# Patient Record
Sex: Male | Born: 2000 | Race: Black or African American | Hispanic: No | Marital: Single | Smoking: Never smoker
Health system: Southern US, Community
[De-identification: ages and names within clinical notes are randomized; demographics above are authoritative.]

## PROBLEM LIST (undated history)

## (undated) DIAGNOSIS — J45909 Unspecified asthma, uncomplicated: Secondary | ICD-10-CM

---
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
## 2018-05-08 ENCOUNTER — Emergency Department (HOSPITAL_COMMUNITY)
Admission: EM | Admit: 2018-05-08 | Discharge: 2018-05-08 | Disposition: A | Discharge status: Home / Self Care | Payer: Medicaid Other | Attending: Emergency Medicine | Admitting: Emergency Medicine

## 2018-05-08 ENCOUNTER — Encounter (HOSPITAL_COMMUNITY): Payer: Self-pay | Admitting: *Deleted

## 2018-05-08 DIAGNOSIS — J4521 Mild intermittent asthma with (acute) exacerbation: Secondary | ICD-10-CM | POA: Diagnosis not present

## 2018-05-08 DIAGNOSIS — R0989 Other specified symptoms and signs involving the circulatory and respiratory systems: Secondary | ICD-10-CM | POA: Diagnosis present

## 2018-05-08 MED ORDER — PREDNISONE 20 MG PO TABS: 40.0000 mg | ORAL_TABLET | Freq: Every day | ORAL | 0 refills | Status: AC

## 2018-05-08 MED ORDER — IPRATROPIUM-ALBUTEROL 0.5-2.5 (3) MG/3ML IN SOLN
3.0000 mL | Freq: Once | RESPIRATORY_TRACT | Status: AC
  Administered 2018-05-08: 3 mL via RESPIRATORY_TRACT
  Filled 2018-05-08: qty 3

## 2018-05-08 MED ORDER — ALBUTEROL SULFATE HFA 108 (90 BASE) MCG/ACT IN AERS: 1.0000 | INHALATION_SPRAY | Freq: Four times a day (QID) | RESPIRATORY_TRACT | 0 refills | Status: DC | PRN

## 2018-05-08 MED ORDER — PREDNISONE 20 MG PO TABS
60.0000 mg | ORAL_TABLET | Freq: Once | ORAL | Status: AC
  Administered 2018-05-08: 60 mg via ORAL
  Filled 2018-05-08: qty 3

## 2018-05-08 NOTE — ED Provider Notes (Signed)
[facility] EMERGENCY DEPARTMENT Provider Note   CSN: [id number] Arrival date & time: 05/08/18  0840     History   Chief Complaint Chief Complaint  Patient presents with  . Asthma    HPI Hector Gross is a 17 y.o. male with past medical history of asthma is here for evaluation of chest tightness.  Onset last night.  Chest tightness is constant, moderate but worse when he exerts himself.  Associated with dry cough, wheezing.  Symptoms are similar to previous asthma flares.  Ran out of his inhaler 1 week ago.  Typically uses it as needed.  He has no fevers, sputum, nausea, vomiting, sore throat, nasal congestion or rhinorrhea.  No cigarette use.  HPI  Past Medical History:  Diagnosis Date  . Asthma     There are no active problems to display for this patient.   History reviewed. No pertinent surgical history.      Home Medications    Prior to Admission medications   Medication Sig Start Date End Date Taking? Authorizing Provider  albuterol (PROVENTIL HFA;VENTOLIN HFA) 108 (90 Base) MCG/ACT inhaler Inhale 1-2 puffs into the lungs every 6 (six) hours as needed for wheezing or shortness of breath. 05/08/18   Liberty Handy, PA-C  predniSONE (DELTASONE) 20 MG tablet Take 2 tablets (40 mg total) by mouth daily for 5 days. 05/08/18 05/13/18  Liberty Handy, PA-C    Family History History reviewed. No pertinent family history.  Social History Social History   Tobacco Use  . Smoking status: Never Smoker  . Smokeless tobacco: Never Used  Substance Use Topics  . Alcohol use: No  . Drug use: No     Allergies   Patient has no known allergies.   Review of Systems Review of Systems  Respiratory: Positive for cough, chest tightness and wheezing.   All other systems reviewed and are negative.    Physical Exam Updated Vital Signs BP (!) 121/97 (BP Location: Right Arm)   Pulse 78   Temp 97.7 F (36.5 C) (Oral)   Resp 16   Ht 5\' 11"   (1.803 m)   SpO2 93%   BMI 25.10 kg/m   Physical Exam  Constitutional: He is oriented to person, place, and time. He appears well-developed and well-nourished. No distress.  NAD.  HENT:  Head: Normocephalic and atraumatic.  Right Ear: External ear normal.  Left Ear: External ear normal.  Nose: Nose normal.  Sounds congested. Mild mucosal edema bilaterally. No rhinorrhea.   Eyes: Conjunctivae and EOM are normal. No scleral icterus.  Neck: Normal range of motion. Neck supple.  Cardiovascular: Normal rate, regular rhythm, normal heart sounds and intact distal pulses.  No murmur heard. Pulmonary/Chest: Effort normal. He has wheezes.  Inspiratory and expiratory wheezing in upper and middle lobes anteriorly/posteriorly.  Clear lung sounds in lower lobes.  Normal work of breathing.  Musculoskeletal: Normal range of motion. He exhibits no deformity.  Neurological: He is alert and oriented to person, place, and time.  Skin: Skin is warm and dry. Capillary refill takes less than 2 seconds.  Psychiatric: He has a normal mood and affect. His behavior is normal. Judgment and thought content normal.  Nursing note and vitals reviewed.    ED Treatments / Results  Labs (all labs ordered are listed, but only abnormal results are displayed) Labs Reviewed - No data to display  EKG None  Radiology No results found.  Procedures Procedures (including critical care time)  Medications  Ordered in ED Medications  ipratropium-albuterol (DUONEB) 0.5-2.5 (3) MG/3ML nebulizer solution 3 mL (3 mLs Nebulization Given 05/08/18 0901)  predniSONE (DELTASONE) tablet 60 mg (60 mg Oral Given 05/08/18 0901)     Initial Impression / Assessment and Plan / ED Course  I have reviewed the triage vital signs and the nursing notes.  Pertinent labs & imaging results that were available during my care of the patient were reviewed by me and considered in my medical decision making (see chart for details).     Pt  presents with chest tightness, wheezing and cough onset last night. H/o asthma. On exam, pt is non toxic appearing with normal breathing effort. No fever, no tachypnea, no tachycardia, normal oxygen saturations. Wheezing improved after duoneb x 1 and prednisone in ER. Pt feels better.  Given reassuring exam w/o fever, tachypnea, hypoxia CXR not indicated.  Likely viral URI in setting of asthma exacerbation vs med non compliance. Will tx symptoms conservatively plus albuterol and prednisone. ED return precautions given. Patient is aware that a viral URI infection may precede the onset of bacterial bronchitis or pneumonia. Patient is aware of s/s that would warrant return to ED for further reevaluation. Pt ambulated with pulse ox within normal limits prior to discharge.    Final Clinical Impressions(s) / ED Diagnoses   Final diagnoses:  Mild intermittent asthma with acute exacerbation    ED Discharge Orders        Ordered    albuterol (PROVENTIL HFA;VENTOLIN HFA) 108 (90 Base) MCG/ACT inhaler  Every 6 hours PRN     05/08/18 0929    predniSONE (DELTASONE) 20 MG tablet  Daily     05/08/18 0929       Liberty Handy, PA-C 05/08/18 0931    Little, Ambrose Finland, MD 05/08/18 1152

## 2018-05-08 NOTE — ED Triage Notes (Signed)
Pt in stating his asthma has been flaring since last night, states he is out of his inhaler, no distress noted, reports cough the last few days

## 2018-05-08 NOTE — Discharge Instructions (Signed)
You were seen in the ER for chest tightness, wheezing, cough.   Your wheezing improved after breathing treatment.   Symptoms are likely from a flare of asthma or non compliance with inhaler.   We will treat your symptoms with a short burst of prednisone and inhaler use every 6 hours. Taking a daily allergy medication can also help prevent and reduce asthma flares.   Return to the ER for fevers, worsening cough with phlegm, chest pain or shortness of breath with exertion.

## 2018-07-04 ENCOUNTER — Emergency Department (HOSPITAL_COMMUNITY): Payer: Medicaid Other

## 2018-07-04 ENCOUNTER — Encounter (HOSPITAL_COMMUNITY): Payer: Self-pay | Admitting: *Deleted

## 2018-07-04 ENCOUNTER — Emergency Department (HOSPITAL_COMMUNITY)
Admission: EM | Admit: 2018-07-04 | Discharge: 2018-07-04 | Disposition: A | Discharge status: Home / Self Care | Payer: Medicaid Other | Attending: Emergency Medicine | Admitting: Emergency Medicine

## 2018-07-04 DIAGNOSIS — J4521 Mild intermittent asthma with (acute) exacerbation: Secondary | ICD-10-CM | POA: Insufficient documentation

## 2018-07-04 DIAGNOSIS — R05 Cough: Secondary | ICD-10-CM | POA: Diagnosis not present

## 2018-07-04 DIAGNOSIS — J45901 Unspecified asthma with (acute) exacerbation: Secondary | ICD-10-CM

## 2018-07-04 MED ORDER — PREDNISONE 10 MG PO TABS: 40.0000 mg | ORAL_TABLET | Freq: Every day | ORAL | 0 refills | Status: AC

## 2018-07-04 MED ORDER — PREDNISONE 20 MG PO TABS
60.0000 mg | ORAL_TABLET | Freq: Once | ORAL | Status: AC
  Administered 2018-07-04: 60 mg via ORAL
  Filled 2018-07-04: qty 3

## 2018-07-04 MED ORDER — ALBUTEROL SULFATE HFA 108 (90 BASE) MCG/ACT IN AERS
1.0000 | INHALATION_SPRAY | Freq: Once | RESPIRATORY_TRACT | Status: AC
  Administered 2018-07-04: 1 via RESPIRATORY_TRACT
  Filled 2018-07-04: qty 6.7

## 2018-07-04 MED ORDER — IPRATROPIUM-ALBUTEROL 0.5-2.5 (3) MG/3ML IN SOLN
3.0000 mL | Freq: Once | RESPIRATORY_TRACT | Status: AC
Start: 2018-07-04 — End: 2018-07-04
  Administered 2018-07-04: 3 mL via RESPIRATORY_TRACT
  Filled 2018-07-04: qty 3

## 2018-07-04 NOTE — ED Provider Notes (Signed)
[facility] EMERGENCY DEPARTMENT Provider Note   CSN: [id number] Arrival date & time: 07/04/18  2130     History   Chief Complaint Chief Complaint  Patient presents with  . Cough    HPI Hector Gross is a 17 y.o. male presenting for evaluation of cough, nasal congestion.  Pt states he has been having cough and shortness of breath since last night.  Patient reports a history of asthma, states this feels similar to previous asthma exacerbations.  He does not have a rescue inhaler at home.  He has used prednisone before, but has not used any recently.  He denies fevers, chills, nasal congestion, ear pain, chest pain, nausea, vomiting, abdominal pain.  He denies tobacco, alcohol, or drug use.  He does not have a PCP.  He has no other medical problems, takes medications daily.    Plan:   HPI  Past Medical History:  Diagnosis Date  . Asthma     There are no active problems to display for this patient.   History reviewed. No pertinent surgical history.      Home Medications    Prior to Admission medications   Medication Sig Start Date End Date Taking? Authorizing Provider  albuterol (PROVENTIL HFA;VENTOLIN HFA) 108 (90 Base) MCG/ACT inhaler Inhale 1-2 puffs into the lungs every 6 (six) hours as needed for wheezing or shortness of breath. 05/08/18   Liberty Handy, PA-C  predniSONE (DELTASONE) 10 MG tablet Take 4 tablets (40 mg total) by mouth daily for 4 days. 07/04/18 07/08/18  Layanna Charo, PA-C    Family History History reviewed. No pertinent family history.  Social History Social History   Tobacco Use  . Smoking status: Never Smoker  . Smokeless tobacco: Never Used  Substance Use Topics  . Alcohol use: No  . Drug use: No     Allergies   Patient has no known allergies.   Review of Systems Review of Systems  Constitutional: Negative for fever.  Respiratory: Positive for cough and shortness of breath.      Physical  Exam Updated Vital Signs BP 138/75 (BP Location: Right Arm)   Pulse 71   Temp 98.4 F (36.9 C) (Oral)   Resp 18   SpO2 96%   Physical Exam  Constitutional: He is oriented to person, place, and time. He appears well-developed and well-nourished. No distress.  Appears in NAD  HENT:  Head: Normocephalic and atraumatic.  Right Ear: Tympanic membrane, external ear and ear canal normal.  Left Ear: Tympanic membrane, external ear and ear canal normal.  Nose: Mucosal edema present. Right sinus exhibits no maxillary sinus tenderness and no frontal sinus tenderness. Left sinus exhibits no maxillary sinus tenderness and no frontal sinus tenderness.  Mouth/Throat: Uvula is midline, oropharynx is clear and moist and mucous membranes are normal. No tonsillar exudate.  OP clear without tonsillar swelling or exudate.  Uvula midline vertical palate rise.  Eyes: Pupils are equal, round, and reactive to light. Conjunctivae and EOM are normal.  Neck: Normal range of motion.  Cardiovascular: Normal rate, regular rhythm and intact distal pulses.  Pulmonary/Chest: Effort normal. He has no decreased breath sounds. He has wheezes. He has no rhonchi. He has no rales.  Pt speaking in full sentences without difficulty.  inspiratory and expiratory wheezes in all fields  Abdominal: Soft. He exhibits no distension. There is no tenderness.  Musculoskeletal: Normal range of motion.  Lymphadenopathy:    He has no cervical adenopathy.  Neurological:  He is alert and oriented to person, place, and time.  Skin: Skin is warm.  Psychiatric: He has a normal mood and affect.  Nursing note and vitals reviewed.    ED Treatments / Results  Labs (all labs ordered are listed, but only abnormal results are displayed) Labs Reviewed - No data to display  EKG None  Radiology Dg Chest 2 View  Result Date: 07/04/2018 CLINICAL DATA:  Cough, nasal congestion EXAM: CHEST - 2 VIEW COMPARISON:  12/30/2017 FINDINGS: Heart and  mediastinal contours are within normal limits. No focal opacities or effusions. No acute bony abnormality. IMPRESSION: No active cardiopulmonary disease. Electronically Signed   By: Charlett Nose M.D.   On: 07/04/2018 09:33    Procedures Procedures (including critical care time)  Medications Ordered in ED Medications  predniSONE (DELTASONE) tablet 60 mg (60 mg Oral Given 07/04/18 0949)  albuterol (PROVENTIL HFA;VENTOLIN HFA) 108 (90 Base) MCG/ACT inhaler 1 puff (1 puff Inhalation Given 07/04/18 0951)  ipratropium-albuterol (DUONEB) 0.5-2.5 (3) MG/3ML nebulizer solution 3 mL (3 mLs Nebulization Given 07/04/18 0951)     Initial Impression / Assessment and Plan / ED Course  I have reviewed the triage vital signs and the nursing notes.  Pertinent labs & imaging results that were available during my care of the patient were reviewed by me and considered in my medical decision making (see chart for details).     Pt presenting for evaluation of cough and shortness of breath.  Physical exam shows young healthy patient in no acute distress.  Pulmonary exam shows inspiratory respiratory wheezing.  Will give prednisone, DuoNeb, inhaler, and reassess.  On reassessment, lungs are improved, scattered intermittent wheezing.  Patient states he no longer feels short of breath or like he is wheezing.  Chest x-ray viewed interpreted by me, no pneumonia, pneumothorax, effusions.  Discussed importance of follow-up with PCP to establish primary care for preventative treatment.  At this time, patient appears safe for discharge.  Return precautions given.  Patient states he understands and agrees plan.   Final Clinical Impressions(s) / ED Diagnoses   Final diagnoses:  Mild asthma with exacerbation, unspecified whether persistent    ED Discharge Orders         Ordered    predniSONE (DELTASONE) 10 MG tablet  Daily     07/04/18 1007           Alveria Apley, PA-C 07/04/18 1009    Azalia Bilis,  MD 07/05/18 2044

## 2018-07-04 NOTE — ED Notes (Signed)
Pt verbalized understanding of discharge instructions and denies any further questions at this time.   

## 2018-07-04 NOTE — ED Triage Notes (Signed)
Pt in c/o asthma exaserbation, is out of his inhaler, took some leftover prednisone but that did not improve things, reports cough and nasal congestion, no distress noted

## 2018-07-04 NOTE — ED Notes (Signed)
ED Provider at bedside. 

## 2018-07-04 NOTE — Discharge Instructions (Signed)
Take prednisone as prescribed. Use the albuterol inhaler as needed for wheezing, shortness of breath, or chest tightness. Return to the emergency room if you develop increased difficult to breathing, persistent chest pain, or any new or concerning symptoms.

## 2018-09-06 ENCOUNTER — Emergency Department (HOSPITAL_COMMUNITY)
Admission: EM | Admit: 2018-09-06 | Discharge: 2018-09-06 | Disposition: A | Discharge status: Home / Self Care | Payer: Medicaid Other | Attending: Emergency Medicine | Admitting: Emergency Medicine

## 2018-09-06 ENCOUNTER — Other Ambulatory Visit: Payer: Self-pay

## 2018-09-06 ENCOUNTER — Encounter (HOSPITAL_COMMUNITY): Payer: Self-pay | Admitting: *Deleted

## 2018-09-06 DIAGNOSIS — R0602 Shortness of breath: Secondary | ICD-10-CM | POA: Diagnosis present

## 2018-09-06 DIAGNOSIS — J45901 Unspecified asthma with (acute) exacerbation: Secondary | ICD-10-CM | POA: Insufficient documentation

## 2018-09-06 MED ORDER — ALBUTEROL SULFATE (2.5 MG/3ML) 0.083% IN NEBU
2.5000 mg | INHALATION_SOLUTION | Freq: Once | RESPIRATORY_TRACT | Status: AC
Start: 2018-09-06 — End: 2018-09-06
  Administered 2018-09-06: 2.5 mg via RESPIRATORY_TRACT
  Filled 2018-09-06: qty 3

## 2018-09-06 MED ORDER — ALBUTEROL SULFATE HFA 108 (90 BASE) MCG/ACT IN AERS: 1.0000 | INHALATION_SPRAY | Freq: Four times a day (QID) | RESPIRATORY_TRACT | 1 refills | Status: DC | PRN

## 2018-09-06 MED ORDER — PREDNISONE 20 MG PO TABS
60.0000 mg | ORAL_TABLET | Freq: Once | ORAL | Status: AC
  Administered 2018-09-06: 60 mg via ORAL
  Filled 2018-09-06: qty 3

## 2018-09-06 MED ORDER — IPRATROPIUM-ALBUTEROL 0.5-2.5 (3) MG/3ML IN SOLN
3.0000 mL | Freq: Once | RESPIRATORY_TRACT | Status: AC
  Administered 2018-09-06: 3 mL via RESPIRATORY_TRACT
  Filled 2018-09-06: qty 3

## 2018-09-06 MED ORDER — ALBUTEROL SULFATE HFA 108 (90 BASE) MCG/ACT IN AERS
2.0000 | INHALATION_SPRAY | Freq: Once | RESPIRATORY_TRACT | Status: AC
  Administered 2018-09-06: 2 via RESPIRATORY_TRACT
  Filled 2018-09-06: qty 6.7

## 2018-09-06 MED ORDER — AEROCHAMBER PLUS FLO-VU MISC
1.0000 | Freq: Once | Status: AC
  Administered 2018-09-06: 1
  Filled 2018-09-06: qty 1

## 2018-09-06 MED ORDER — PREDNISONE 20 MG PO TABS: 60.0000 mg | ORAL_TABLET | Freq: Every day | ORAL | 0 refills | Status: AC

## 2018-09-06 NOTE — Discharge Instructions (Addendum)
Take prednisone as directed for the next 5 days to help prevent worsening of your asthma exacerbation, use albuterol inhaler every 4-6 hours for the next day or so and then as needed for shortness of breath and wheezing.  Follow-up with your primary care doctor for continued evaluation and treatment of your asthma.  Return for fevers, worsening shortness of breath or increased work of breathing, cough, chest pain or any other new or concerning symptoms.

## 2018-09-06 NOTE — ED Triage Notes (Signed)
Pt reports due to weather changes he has been having SHOB due to asthma

## 2018-09-06 NOTE — ED Provider Notes (Signed)
[facility] EMERGENCY DEPARTMENT Provider Note   CSN: [id number] Arrival date & time: 09/06/18  0907     History   Chief Complaint Chief Complaint  Patient presents with  . Asthma    HPI Hector Gross is a 17 y.o. male.  Hector Gross is a 17 y.o. Male with a history of asthma, who presents to the emergency department for evaluation of shortness of breath and wheezing which started last night.  He reports this feels like his typical asthma exacerbation.  He has had some associated dry cough but no nasal congestion or rhinorrhea, no sore throat, no ear pain.  No associated fevers or chills.  Patient denies any chest pain.  He reports this feels like his typical asthma exacerbation was also happens with changes in weather or when he gets hot or has increased physical activity.  He reports he is out of his inhaler and has not tried anything else to treat his symptoms prior to arrival, no other aggravating or relieving factors.     Past Medical History:  Diagnosis Date  . Asthma     There are no active problems to display for this patient.   No past surgical history on file.      Home Medications    Prior to Admission medications   Medication Sig Start Date End Date Taking? Authorizing Provider  albuterol (PROVENTIL HFA;VENTOLIN HFA) 108 (90 Base) MCG/ACT inhaler Inhale 1-2 puffs into the lungs every 6 (six) hours as needed for wheezing or shortness of breath. 05/08/18   Liberty Handy, PA-C    Family History No family history on file.  Social History Social History   Tobacco Use  . Smoking status: Never Smoker  . Smokeless tobacco: Never Used  Substance Use Topics  . Alcohol use: No  . Drug use: No     Allergies   Patient has no known allergies.   Review of Systems Review of Systems  Constitutional: Negative for chills and fever.  HENT: Negative for congestion, ear pain, rhinorrhea and sore throat.   Respiratory: Positive for  cough, shortness of breath and wheezing. Negative for chest tightness.   Cardiovascular: Negative for chest pain and leg swelling.  Gastrointestinal: Negative for abdominal pain, nausea and vomiting.  Neurological: Negative for dizziness, syncope and light-headedness.  All other systems reviewed and are negative.    Physical Exam Updated Vital Signs BP (!) 127/113 (BP Location: Right Arm)   Pulse 83   Temp 98.1 F (36.7 C) (Oral)   Resp (!) 22   Ht 5\' 10"  (1.778 m)   Wt 86.2 kg   SpO2 96%   BMI 27.26 kg/m   Physical Exam  Constitutional: He appears well-developed and well-nourished. No distress.  HENT:  Head: Normocephalic and atraumatic.  Mouth/Throat: Oropharynx is clear and moist.  Eyes: Right eye exhibits no discharge. Left eye exhibits no discharge.  Neck: Neck supple.  Cardiovascular: Normal rate, regular rhythm, normal heart sounds and intact distal pulses. Exam reveals no gallop and no friction rub.  No murmur heard. Pulmonary/Chest: Effort normal. No respiratory distress. He has wheezes.  Respirations equal and unlabored, patient is able to speak in full sentences, lungs with faint expiratory wheezes throughout and lungs sound tight overall, no rales or rhonchi  Abdominal: Soft. Bowel sounds are normal. He exhibits no distension and no mass. There is no tenderness. There is no guarding.  Neurological: He is alert. Coordination normal.  Skin: Skin is warm and  dry. Capillary refill takes less than 2 seconds. He is not diaphoretic.  Psychiatric: He has a normal mood and affect. His behavior is normal.  Nursing note and vitals reviewed.    ED Treatments / Results  Labs (all labs ordered are listed, but only abnormal results are displayed) Labs Reviewed - No data to display  EKG None  Radiology No results found.  Procedures Procedures (including critical care time)  Medications Ordered in ED Medications  albuterol (PROVENTIL HFA;VENTOLIN HFA) 108 (90 Base)  MCG/ACT inhaler 2 puff (has no administration in time range)  aerochamber plus with mask device 1 each (has no administration in time range)  ipratropium-albuterol (DUONEB) 0.5-2.5 (3) MG/3ML nebulizer solution 3 mL (3 mLs Nebulization Given 09/06/18 0922)  albuterol (PROVENTIL) (2.5 MG/3ML) 0.083% nebulizer solution 2.5 mg (2.5 mg Nebulization Given 09/06/18 0922)  predniSONE (DELTASONE) tablet 60 mg (60 mg Oral Given 09/06/18 0922)  ipratropium-albuterol (DUONEB) 0.5-2.5 (3) MG/3ML nebulizer solution 3 mL (3 mLs Nebulization Given 09/06/18 1001)  albuterol (PROVENTIL) (2.5 MG/3ML) 0.083% nebulizer solution 2.5 mg (2.5 mg Nebulization Given 09/06/18 1001)     Initial Impression / Assessment and Plan / ED Course  I have reviewed the triage vital signs and the nursing notes.  Pertinent labs & imaging results that were available during my care of the patient were reviewed by me and considered in my medical decision making (see chart for details).  Patient presents to the emergency department with shortness of breath, wheezing and dry cough, this is consistent with his typical asthma exacerbation and he has been out of his inhaler.  He reports it feels very tight and wheezy when he breathes.  Symptoms started last night.  On arrival patient is able to speak in full sentences he is minimally tachypneic with good O2 saturations, on exam he has some scattered expiratory wheezes and sounds very tight with decreased air movement.  Will give dose of prednisone and nebulizer treatment and reevaluate.  After first nebulizer patient reports some improvement although reports that still feels tight when he breathes he has some increased air movement but now has some increased wheezing throughout we will give 1 additional nebulizer, but patient has good O2 saturations and no increased work of breathing and feel he can likely be discharged home.  After second breathing treatment air movement is much improved with  only few scattered expiratory wheezes, at this time feel patient is stable for discharge home will provide short course of prednisone, given albuterol inhaler here in the emergency department as well as prescription for replacement inhaler.  Patient encouraged to follow-up with his regular doctor.  Return precautions discussed.  Patient expresses understanding and is in agreement with plan.  Final Clinical Impressions(s) / ED Diagnoses   Final diagnoses:  Exacerbation of asthma, unspecified asthma severity, unspecified whether persistent    ED Discharge Orders         Ordered    predniSONE (DELTASONE) 20 MG tablet  Daily     09/06/18 1014    albuterol (PROVENTIL HFA;VENTOLIN HFA) 108 (90 Base) MCG/ACT inhaler  Every 6 hours PRN     09/06/18 1014           Jodi Geralds [place], [place] 09/06/18 1021    Loren Racer, MD 09/07/18 [phone]

## 2018-09-18 ENCOUNTER — Encounter (HOSPITAL_COMMUNITY): Payer: Self-pay | Admitting: Emergency Medicine

## 2018-09-18 ENCOUNTER — Other Ambulatory Visit: Payer: Self-pay

## 2018-09-18 ENCOUNTER — Emergency Department (HOSPITAL_COMMUNITY)
Admission: EM | Admit: 2018-09-18 | Discharge: 2018-09-18 | Disposition: A | Discharge status: Home / Self Care | Payer: Medicaid Other | Attending: Emergency Medicine | Admitting: Emergency Medicine

## 2018-09-18 DIAGNOSIS — J45901 Unspecified asthma with (acute) exacerbation: Secondary | ICD-10-CM

## 2018-09-18 DIAGNOSIS — R0602 Shortness of breath: Secondary | ICD-10-CM | POA: Diagnosis not present

## 2018-09-18 MED ORDER — IPRATROPIUM-ALBUTEROL 0.5-2.5 (3) MG/3ML IN SOLN
3.0000 mL | Freq: Once | RESPIRATORY_TRACT | Status: AC
  Administered 2018-09-18: 3 mL via RESPIRATORY_TRACT
  Filled 2018-09-18: qty 3

## 2018-09-18 MED ORDER — PREDNISONE 20 MG PO TABS
60.0000 mg | ORAL_TABLET | Freq: Once | ORAL | Status: AC
  Administered 2018-09-18: 60 mg via ORAL
  Filled 2018-09-18: qty 3

## 2018-09-18 MED ORDER — PREDNISONE 20 MG PO TABS: 40.0000 mg | ORAL_TABLET | Freq: Every day | ORAL | 0 refills | Status: DC

## 2018-09-18 MED ORDER — ALBUTEROL SULFATE HFA 108 (90 BASE) MCG/ACT IN AERS
1.0000 | INHALATION_SPRAY | Freq: Once | RESPIRATORY_TRACT | Status: AC
  Administered 2018-09-18: 2 via RESPIRATORY_TRACT
  Filled 2018-09-18: qty 6.7

## 2018-09-18 NOTE — Discharge Instructions (Signed)
Please take prednisone for the next 5 days Use inhaler as needed Follow up with [place] and wellness

## 2018-09-18 NOTE — ED Provider Notes (Signed)
[facility] EMERGENCY DEPARTMENT Provider Note   CSN: [id number] Arrival date & time: 09/18/18  0601     History   Chief Complaint Chief Complaint  Patient presents with  . Asthma    HPI Hector Gross is a 17 y.o. male who presents with SOB. PMH significant for asthma. He states he is here because he is having an asthma exacerbation starting tonight.  He's had coughing, wheezing, SOB. No chest pain or fevers. Nothing makes it better or worse.  He was here on 11/23 for the same and was prescribed steroids. He never took them. He's out of his inhaler.  HPI  Past Medical History:  Diagnosis Date  . Asthma     There are no active problems to display for this patient.   No past surgical history on file.      Home Medications    Prior to Admission medications   Medication Sig Start Date End Date Taking? Authorizing Provider  albuterol (PROVENTIL HFA;VENTOLIN HFA) 108 (90 Base) MCG/ACT inhaler Inhale 1-2 puffs into the lungs every 6 (six) hours as needed for wheezing or shortness of breath. 09/06/18   Dartha Lodge, PA-C    Family History No family history on file.  Social History Social History   Tobacco Use  . Smoking status: Never Smoker  . Smokeless tobacco: Never Used  Substance Use Topics  . Alcohol use: No  . Drug use: No     Allergies   Patient has no known allergies.   Review of Systems Review of Systems  Constitutional: Negative for fever.  Respiratory: Positive for cough, shortness of breath and wheezing.   Cardiovascular: Negative for chest pain.  All other systems reviewed and are negative.    Physical Exam Updated Vital Signs BP (!) 149/75 (BP Location: Right Arm)   Pulse 78   Temp 98 F (36.7 C)   Resp 19   Ht 5\' 11"  (1.803 m)   Wt 99.8 kg   SpO2 95%   BMI 30.68 kg/m   Physical Exam  Constitutional: He is oriented to person, place, and time. He appears well-developed and well-nourished. No distress.    HENT:  Head: Normocephalic and atraumatic.  Eyes: Pupils are equal, round, and reactive to light. Conjunctivae are normal. Right eye exhibits no discharge. Left eye exhibits no discharge. No scleral icterus.  Neck: Normal range of motion.  Cardiovascular: Normal rate and regular rhythm.  Pulmonary/Chest: Effort normal. No respiratory distress. He has wheezes (diffuse inspiratory and expiratory).  Abdominal: He exhibits no distension.  Neurological: He is alert and oriented to person, place, and time.  Skin: Skin is warm and dry.  Psychiatric: He has a normal mood and affect. His behavior is normal.  Nursing note and vitals reviewed.    ED Treatments / Results  Labs (all labs ordered are listed, but only abnormal results are displayed) Labs Reviewed - No data to display  EKG None  Radiology No results found.  Procedures Procedures (including critical care time)  Medications Ordered in ED Medications  albuterol (PROVENTIL HFA;VENTOLIN HFA) 108 (90 Base) MCG/ACT inhaler 1-2 puff (has no administration in time range)  ipratropium-albuterol (DUONEB) 0.5-2.5 (3) MG/3ML nebulizer solution 3 mL (3 mLs Nebulization Given 09/18/18 0628)  predniSONE (DELTASONE) tablet 60 mg (60 mg Oral Given 09/18/18 0628)  ipratropium-albuterol (DUONEB) 0.5-2.5 (3) MG/3ML nebulizer solution 3 mL (3 mLs Nebulization Given 09/18/18 0729)     Initial Impression / Assessment and Plan / ED Course  I have reviewed the triage vital signs and the nursing notes.  Pertinent labs & imaging results that were available during my care of the patient were reviewed by me and considered in my medical decision making (see chart for details).  17 year old male with asthma exacerbation. He is out of his inhaler again and didn't take steroids that were prescribed to him at last visit. Vitals are normal. Exam is remarkable for diffuse wheezing. No fever, significant cough, or chest pain. Will not obtain labs or CXR at this  time.  On repeat lung exam wheezing is improved and he feels better. He still has some wheezing. Will order 2nd tx. Pt is agreeable.  After second tx he still has mild wheezes but feels back to baseline. He doesn't have a PCP. He was encouraged to establish care. He was given another inhaler here and advised to take a prednisone burst.  Final Clinical Impressions(s) / ED Diagnoses   Final diagnoses:  Moderate asthma with exacerbation, unspecified whether persistent    ED Discharge Orders    None       Bethel Born, PA-C 09/18/18 1610    Shaune Pollack, MD 09/18/18 Ernestina Columbia

## 2018-09-18 NOTE — ED Notes (Signed)
Pt resting sitting up in bed, pt feels much better since receiving medication here. Only wheezing in left lower lobe now. VSS, NAD. Speaking in complete sentences.

## 2018-09-18 NOTE — ED Notes (Signed)
Lung sounds improved but expiratory wheezes noted.

## 2018-09-18 NOTE — ED Triage Notes (Signed)
Pt reports SOB and wheezing that woke him up. Pt reports hx of asthma and recent treatment here for same.

## 2018-10-02 ENCOUNTER — Emergency Department (HOSPITAL_COMMUNITY)
Admission: EM | Admit: 2018-10-02 | Discharge: 2018-10-02 | Disposition: A | Discharge status: Home / Self Care | Payer: Medicaid Other | Attending: Emergency Medicine | Admitting: Emergency Medicine

## 2018-10-02 ENCOUNTER — Encounter (HOSPITAL_COMMUNITY): Payer: Self-pay | Admitting: Emergency Medicine

## 2018-10-02 ENCOUNTER — Other Ambulatory Visit: Payer: Self-pay

## 2018-10-02 DIAGNOSIS — I110 Hypertensive heart disease with heart failure: Secondary | ICD-10-CM | POA: Insufficient documentation

## 2018-10-02 DIAGNOSIS — F4321 Adjustment disorder with depressed mood: Secondary | ICD-10-CM | POA: Diagnosis not present

## 2018-10-02 DIAGNOSIS — I5042 Chronic combined systolic (congestive) and diastolic (congestive) heart failure: Secondary | ICD-10-CM | POA: Diagnosis not present

## 2018-10-02 DIAGNOSIS — Z7982 Long term (current) use of aspirin: Secondary | ICD-10-CM | POA: Diagnosis not present

## 2018-10-02 DIAGNOSIS — Z79899 Other long term (current) drug therapy: Secondary | ICD-10-CM | POA: Insufficient documentation

## 2018-10-02 DIAGNOSIS — J45901 Unspecified asthma with (acute) exacerbation: Secondary | ICD-10-CM

## 2018-10-02 DIAGNOSIS — I428 Other cardiomyopathies: Secondary | ICD-10-CM | POA: Insufficient documentation

## 2018-10-02 DIAGNOSIS — R079 Chest pain, unspecified: Secondary | ICD-10-CM | POA: Insufficient documentation

## 2018-10-02 DIAGNOSIS — F17200 Nicotine dependence, unspecified, uncomplicated: Secondary | ICD-10-CM | POA: Insufficient documentation

## 2018-10-02 MED ORDER — ALBUTEROL SULFATE (2.5 MG/3ML) 0.083% IN NEBU
5.0000 mg | INHALATION_SOLUTION | RESPIRATORY_TRACT | Status: DC | PRN
Start: 2018-10-02 — End: 2018-10-02
  Administered 2018-10-02: 5 mg via RESPIRATORY_TRACT
  Filled 2018-10-02: qty 6

## 2018-10-02 MED ORDER — PREDNISONE 20 MG PO TABS
60.0000 mg | ORAL_TABLET | Freq: Once | ORAL | Status: AC
  Administered 2018-10-02: 60 mg via ORAL
  Filled 2018-10-02: qty 3

## 2018-10-02 MED ORDER — ALBUTEROL SULFATE HFA 108 (90 BASE) MCG/ACT IN AERS
1.0000 | INHALATION_SPRAY | Freq: Four times a day (QID) | RESPIRATORY_TRACT | Status: DC | PRN
  Administered 2018-10-02: 1 via RESPIRATORY_TRACT
  Filled 2018-10-02: qty 6.7

## 2018-10-02 MED ORDER — BUDESONIDE 90 MCG/ACT IN AEPB: 1.0000 | INHALATION_SPRAY | Freq: Two times a day (BID) | RESPIRATORY_TRACT | 1 refills | Status: DC

## 2018-10-02 MED ORDER — PREDNISONE 50 MG PO TABS: 50.0000 mg | ORAL_TABLET | Freq: Every day | ORAL | 0 refills | Status: DC

## 2018-10-02 NOTE — ED Triage Notes (Signed)
Pt reports difficulty breathing since yesterday, ran out of nebulizer solution today. States he doesn't have an inhaler

## 2018-10-02 NOTE — ED Provider Notes (Signed)
[facility] EMERGENCY DEPARTMENT Provider Note   CSN: [id number] Arrival date & time: 10/02/18  1804     History   Chief Complaint Chief Complaint  Patient presents with  . Asthma    HPI Hector Gross is a 17 y.o. male.  HPI Patient presents to the emergency room for evaluation of wheezing and shortness of breath.  Patient has a history of asthma.  He does not smoke.  He uses an albuterol inhaler and generally has to use it daily.  Patient ran out of it and started feeling short of breath today.  He denies any fevers or chills.  No chest pain.  No abdominal pain.  No swelling. Past Medical History:  Diagnosis Date  . Asthma     There are no active problems to display for this patient.   History reviewed. No pertinent surgical history.      Home Medications    Prior to Admission medications   Medication Sig Start Date End Date Taking? Authorizing Provider  Budesonide (PULMICORT FLEXHALER) 90 MCG/ACT inhaler Inhale 1 puff into the lungs 2 (two) times daily. 10/02/18   Linwood Dibbles, MD  predniSONE (DELTASONE) 50 MG tablet Take 1 tablet (50 mg total) by mouth daily. 10/02/18   Linwood Dibbles, MD    Family History No family history on file.  Social History Social History   Tobacco Use  . Smoking status: Never Smoker  . Smokeless tobacco: Never Used  Substance Use Topics  . Alcohol use: No  . Drug use: No     Allergies   Patient has no known allergies.   Review of Systems Review of Systems  All other systems reviewed and are negative.    Physical Exam Updated Vital Signs BP 111/74 (BP Location: Right Arm)   Pulse (!) 111   Temp 97.8 F (36.6 C) (Oral)   Resp 18   Ht 1.803 m (5\' 11" )   Wt 90.7 kg   SpO2 99%   BMI 27.89 kg/m   Physical Exam Vitals signs and nursing note reviewed.  Constitutional:      General: He is not in acute distress.    Appearance: He is well-developed.  HENT:     Head: Normocephalic and atraumatic.     Right Ear: External ear normal.     Left Ear: External ear normal.  Eyes:     General: No scleral icterus.       Right eye: No discharge.        Left eye: No discharge.     Conjunctiva/sclera: Conjunctivae normal.  Neck:     Musculoskeletal: Neck supple.     Trachea: No tracheal deviation.  Cardiovascular:     Rate and Rhythm: Normal rate and regular rhythm.  Pulmonary:     Effort: Pulmonary effort is normal. No respiratory distress.     Breath sounds: No stridor. Wheezing present. No rales.     Comments: Able to speak in full sentences Abdominal:     General: Bowel sounds are normal. There is no distension.     Palpations: Abdomen is soft.     Tenderness: There is no abdominal tenderness. There is no guarding or rebound.  Musculoskeletal:        General: No tenderness.  Skin:    General: Skin is warm and dry.     Findings: No rash.  Neurological:     Mental Status: He is alert.     Cranial Nerves: No cranial nerve deficit (  no facial droop, extraocular movements intact, no slurred speech).     Sensory: No sensory deficit.     Motor: No abnormal muscle tone or seizure activity.     Coordination: Coordination normal.      ED Treatments / Results  Labs (all labs ordered are listed, but only abnormal results are displayed) Labs Reviewed - No data to display  EKG None  Radiology No results found.  Procedures Procedures (including critical care time)  Medications Ordered in ED Medications  albuterol (PROVENTIL HFA;VENTOLIN HFA) 108 (90 Base) MCG/ACT inhaler 1-2 puff (has no administration in time range)  predniSONE (DELTASONE) tablet 60 mg (60 mg Oral Given 10/02/18 1828)     Initial Impression / Assessment and Plan / ED Course  I have reviewed the triage vital signs and the nursing notes.  Pertinent labs & imaging results that were available during my care of the patient were reviewed by me and considered in my medical decision making (see chart for  details).  Clinical Course as of Oct 02 1921  Thu Oct 02, 2018  [id number] Patient is feeling better after treatment.  On repeat exam he is no longer wheezing   [JK]    Clinical Course User Index [JK] Linwood DibblesKnapp, Vercie Pokorny, MD    Patient presented with recurrent asthma exacerbation.  He was treated with albuterol treatments and steroids with good relief.  Patient has been using his albuterol inhaler daily.  Does not have a primary care doctor.  Discussed the importance of following up with a primary care doctor.  I think he would benefit from daily inhaled steroids considering his frequent albuterol use.  I will give him a prescription for Pulmicort.  I recommend follow-up with a primary care doctor.  Final Clinical Impressions(s) / ED Diagnoses   Final diagnoses:  Moderate asthma with exacerbation, unspecified whether persistent    ED Discharge Orders         Ordered    predniSONE (DELTASONE) 50 MG tablet  Daily     10/02/18 1920    Budesonide (PULMICORT FLEXHALER) 90 MCG/ACT inhaler  2 times daily     10/02/18 Glorious [id number]           Lejon Afzal, MD 10/02/18 Ernestina [id number]

## 2018-10-02 NOTE — Discharge Instructions (Addendum)
Follow-up with your primary care doctor, start taking the inhaled steroid, use the albuterol for breakthrough wheezing

## 2018-11-24 ENCOUNTER — Encounter (HOSPITAL_COMMUNITY): Payer: Self-pay

## 2018-11-24 ENCOUNTER — Emergency Department (HOSPITAL_COMMUNITY): Payer: Medicaid Other

## 2018-11-24 ENCOUNTER — Emergency Department (HOSPITAL_COMMUNITY)
Admission: EM | Admit: 2018-11-24 | Discharge: 2018-11-24 | Disposition: A | Discharge status: Home / Self Care | Payer: Medicaid Other | Attending: Emergency Medicine | Admitting: Emergency Medicine

## 2018-11-24 DIAGNOSIS — Z79899 Other long term (current) drug therapy: Secondary | ICD-10-CM | POA: Diagnosis not present

## 2018-11-24 DIAGNOSIS — R05 Cough: Secondary | ICD-10-CM | POA: Diagnosis not present

## 2018-11-24 DIAGNOSIS — J45901 Unspecified asthma with (acute) exacerbation: Secondary | ICD-10-CM

## 2018-11-24 MED ORDER — ALBUTEROL SULFATE (2.5 MG/3ML) 0.083% IN NEBU
5.0000 mg | INHALATION_SOLUTION | Freq: Once | RESPIRATORY_TRACT | Status: AC
  Administered 2018-11-24: 5 mg via RESPIRATORY_TRACT

## 2018-11-24 MED ORDER — PREDNISONE 20 MG PO TABS
60.0000 mg | ORAL_TABLET | Freq: Once | ORAL | Status: AC
  Administered 2018-11-24: 60 mg via ORAL
  Filled 2018-11-24: qty 3

## 2018-11-24 MED ORDER — PREDNISONE 50 MG PO TABS: 50.0000 mg | ORAL_TABLET | Freq: Every day | ORAL | 0 refills | Status: DC

## 2018-11-24 MED ORDER — ALBUTEROL SULFATE HFA 108 (90 BASE) MCG/ACT IN AERS: 1.0000 | INHALATION_SPRAY | Freq: Four times a day (QID) | RESPIRATORY_TRACT | 1 refills | Status: DC | PRN

## 2018-11-24 MED ORDER — ALBUTEROL SULFATE (2.5 MG/3ML) 0.083% IN NEBU
INHALATION_SOLUTION | RESPIRATORY_TRACT | Status: AC
  Administered 2018-11-24: 5 mg
  Filled 2018-11-24: qty 6

## 2018-11-24 MED ORDER — IPRATROPIUM-ALBUTEROL 0.5-2.5 (3) MG/3ML IN SOLN
3.0000 mL | Freq: Once | RESPIRATORY_TRACT | Status: AC
  Administered 2018-11-24: 3 mL via RESPIRATORY_TRACT
  Filled 2018-11-24: qty 3

## 2018-11-24 NOTE — ED Provider Notes (Signed)
[facility] EMERGENCY DEPARTMENT Provider Note   CSN: [id number] Arrival date & time: 11/24/18  1005     History   Chief Complaint No chief complaint on file.   HPI Hector Gross is a 18 y.o. male.  HPI   18 year old male with a significant past medical history of asthma presents today with complaints of asthma exacerbation.  Patient notes over the last 3 days he has had rhinorrhea and nasal congestion.  He denies any fever.  He notes minor cough in addition to the above symptoms.  Last night he had an asthma exacerbation typical of previous with shortness of breath.  EMS was called who gave him a treatment of albuterol in his house.  He notes symptoms resolved but then again recurred this morning.  Patient notes chest tightness and shortness of breath.  He notes no history of hospitalizations or intubations secondary to asthma.  He notes his albuterol inhaler is expired.  Past Medical History:  Diagnosis Date  . Asthma     There are no active problems to display for this patient.   History reviewed. No pertinent surgical history.      Home Medications    Prior to Admission medications   Medication Sig Start Date End Date Taking? Authorizing Provider  albuterol (PROVENTIL HFA;VENTOLIN HFA) 108 (90 Base) MCG/ACT inhaler Inhale 1-2 puffs into the lungs every 6 (six) hours as needed for wheezing or shortness of breath. 11/24/18   Latissa Frick, Tinnie Gens, PA-C  Budesonide (PULMICORT FLEXHALER) 90 MCG/ACT inhaler Inhale 1 puff into the lungs 2 (two) times daily. 10/02/18   Linwood Dibbles, MD  predniSONE (DELTASONE) 50 MG tablet Take 1 tablet (50 mg total) by mouth daily. 11/24/18   Eyvonne Mechanic, PA-C    Family History No family history on file.  Social History Social History   Tobacco Use  . Smoking status: Never Smoker  . Smokeless tobacco: Never Used  Substance Use Topics  . Alcohol use: No  . Drug use: No     Allergies   Patient has no known  allergies.   Review of Systems Review of Systems  All other systems reviewed and are negative.    Physical Exam Updated Vital Signs BP 129/79 (BP Location: Right Arm)   Pulse 91   Temp 98.2 F (36.8 C) (Oral)   Resp 16   SpO2 93%   Physical Exam Vitals signs and nursing note reviewed.  Constitutional:      Appearance: He is well-developed.  HENT:     Head: Normocephalic and atraumatic.  Eyes:     General: No scleral icterus.       Right eye: No discharge.        Left eye: No discharge.     Conjunctiva/sclera: Conjunctivae normal.     Pupils: Pupils are equal, round, and reactive to light.  Neck:     Musculoskeletal: Normal range of motion.     Vascular: No JVD.     Trachea: No tracheal deviation.  Pulmonary:     Effort: Pulmonary effort is normal.     Breath sounds: No stridor.     Comments: Bilateral inspiratory and expiratory wheeze-no crackles noted-no respiratory distress Neurological:     Mental Status: He is alert and oriented to person, place, and time.     Coordination: Coordination normal.  Psychiatric:        Behavior: Behavior normal.        Thought Content: Thought content normal.  Judgment: Judgment normal.      ED Treatments / Results  Labs (all labs ordered are listed, but only abnormal results are displayed) Labs Reviewed - No data to display  EKG None  Radiology Dg Chest 2 View  Result Date: 11/24/2018 CLINICAL DATA:  Productive cough.  Asthma exacerbation. EXAM: CHEST - 2 VIEW COMPARISON:  Radiographs of July 04, 2018. FINDINGS: The heart size and mediastinal contours are within normal limits. Both lungs are clear. The visualized skeletal structures are unremarkable. IMPRESSION: No active cardiopulmonary disease. Electronically Signed   By: Lupita Raider, M.D.   On: 11/24/2018 11:33    Procedures Procedures (including critical care time)  Medications Ordered in ED Medications  albuterol (PROVENTIL) (2.5 MG/3ML) 0.083%  nebulizer solution (5 mg  Given 11/24/18 1017)  albuterol (PROVENTIL) (2.5 MG/3ML) 0.083% nebulizer solution 5 mg (5 mg Nebulization Given 11/24/18 1015)  predniSONE (DELTASONE) tablet 60 mg (60 mg Oral Given 11/24/18 1027)  ipratropium-albuterol (DUONEB) 0.5-2.5 (3) MG/3ML nebulizer solution 3 mL (3 mLs Nebulization Given 11/24/18 1145)     Initial Impression / Assessment and Plan / ED Course  I have reviewed the triage vital signs and the nursing notes.  Pertinent labs & imaging results that were available during my care of the patient were reviewed by me and considered in my medical decision making (see chart for details).     18 year old male presents today with asthma exacerbation.  He notes this is typical of previous.  Patient does have likely viral URI.  Patient received prednisone, albuterol, and ipratropium here.  He had complete resolution of his symptoms.  He was monitored with no significant changes.  He will be discharged with albuterol, prednisone as needed.  He will return immediately failed with any new or worsening signs or symptoms.  He verbalized understanding and agreement to today's plan had no further questions or concerns.  Final Clinical Impressions(s) / ED Diagnoses   Final diagnoses:  Moderate asthma with exacerbation, unspecified whether persistent    ED Discharge Orders         Ordered    albuterol (PROVENTIL HFA;VENTOLIN HFA) 108 (90 Base) MCG/ACT inhaler  Every 6 hours PRN     11/24/18 1209    predniSONE (DELTASONE) 50 MG tablet  Daily     11/24/18 1209           Eyvonne Mechanic, PA-C 11/24/18 1209    Rolan Bucco, MD 11/24/18 1250

## 2018-11-24 NOTE — Discharge Instructions (Addendum)
Please read attached information. If you experience any new or worsening signs or symptoms please return to the emergency room for evaluation. Please follow-up with your primary care provider or specialist as discussed. Please use medication prescribed only as directed and discontinue taking if you have any concerning signs or symptoms.   °

## 2018-11-24 NOTE — ED Triage Notes (Signed)
Patient complains of asthma exacerbation since last night. Cough with wheezing. States that he used inhaler with minimal relief, reports expired. Speaking full sentences

## 2018-12-10 ENCOUNTER — Emergency Department (HOSPITAL_COMMUNITY)
Admission: EM | Admit: 2018-12-10 | Discharge: 2018-12-11 | Disposition: A | Discharge status: Home / Self Care | Payer: Medicaid Other | Attending: Emergency Medicine | Admitting: Emergency Medicine

## 2018-12-10 DIAGNOSIS — R0602 Shortness of breath: Secondary | ICD-10-CM | POA: Diagnosis present

## 2018-12-10 DIAGNOSIS — Z79899 Other long term (current) drug therapy: Secondary | ICD-10-CM | POA: Diagnosis not present

## 2018-12-10 DIAGNOSIS — J4541 Moderate persistent asthma with (acute) exacerbation: Secondary | ICD-10-CM | POA: Insufficient documentation

## 2018-12-10 MED ORDER — ALBUTEROL SULFATE (2.5 MG/3ML) 0.083% IN NEBU
5.0000 mg | INHALATION_SOLUTION | Freq: Once | RESPIRATORY_TRACT | Status: AC
  Administered 2018-12-11: 5 mg via RESPIRATORY_TRACT
  Filled 2018-12-10: qty 6

## 2018-12-11 ENCOUNTER — Encounter (HOSPITAL_COMMUNITY): Payer: Self-pay

## 2018-12-11 ENCOUNTER — Other Ambulatory Visit: Payer: Self-pay

## 2018-12-11 MED ORDER — MOMETASONE FURO-FORMOTEROL FUM 200-5 MCG/ACT IN AERO
2.0000 | INHALATION_SPRAY | Freq: Two times a day (BID) | RESPIRATORY_TRACT | Status: AC
  Administered 2018-12-11: 2 via RESPIRATORY_TRACT
  Filled 2018-12-11: qty 8.8

## 2018-12-11 MED ORDER — AEROCHAMBER PLUS FLO-VU LARGE MISC
Status: AC
  Administered 2018-12-11: 02:00:00
  Filled 2018-12-11: qty 1

## 2018-12-11 MED ORDER — ALBUTEROL SULFATE HFA 108 (90 BASE) MCG/ACT IN AERS
2.0000 | INHALATION_SPRAY | RESPIRATORY_TRACT | Status: DC
  Administered 2018-12-11: 2 via RESPIRATORY_TRACT
  Filled 2018-12-11: qty 6.7

## 2018-12-11 MED ORDER — AEROCHAMBER PLUS FLO-VU LARGE MISC: 1.0000 | Freq: Once | Status: DC

## 2018-12-11 NOTE — ED Provider Notes (Signed)
[facility] EMERGENCY DEPARTMENT Provider Note   CSN: [id number] Arrival date & time: 12/10/18  2344    History   Chief Complaint Chief Complaint  Patient presents with  . Asthma    HPI Hector Gross is a 18 y.o. male with a history of asthma who presents to the Emergency Department with a chief complaint of shortness of breath.   He endorses constant, worsening shortness of breath, onset earlier today. He reports associated productive cough with yellow sputum that is worse at night.  He reports that he has been having to use his home inhaler for 10 to 12 puffs every night for the last 4 to 5 months.  He reports that his asthma is triggered by changes in the temperature.  He reports that he ran out of his home albuterol inhaler earlier today.  He denies fever, chills, chest pain, nausea, vomiting, diarrhea, palpitations, leg swelling.  He is scheduled for a first appointment with a new PCP in early March.      The history is provided by the patient. No language interpreter was used.    Past Medical History:  Diagnosis Date  . Asthma     There are no active problems to display for this patient.   History reviewed. No pertinent surgical history.      Home Medications    Prior to Admission medications   Medication Sig Start Date End Date Taking? Authorizing Provider  albuterol (PROVENTIL HFA;VENTOLIN HFA) 108 (90 Base) MCG/ACT inhaler Inhale 1-2 puffs into the lungs every 6 (six) hours as needed for wheezing or shortness of breath. 11/24/18   Hedges, Tinnie Gens, PA-C  Budesonide (PULMICORT FLEXHALER) 90 MCG/ACT inhaler Inhale 1 puff into the lungs 2 (two) times daily. 10/02/18   Linwood Dibbles, MD  predniSONE (DELTASONE) 50 MG tablet Take 1 tablet (50 mg total) by mouth daily. 11/24/18   Eyvonne Mechanic, PA-C    Family History History reviewed. No pertinent family history.  Social History Social History   Tobacco Use  . Smoking status: Never Smoker  .  Smokeless tobacco: Never Used  Substance Use Topics  . Alcohol use: No  . Drug use: No     Allergies   Patient has no known allergies.   Review of Systems Review of Systems  Constitutional: Negative for appetite change, chills and fever.  HENT: Negative for congestion, sinus pressure, sinus pain and sore throat.   Eyes: Negative for visual disturbance.  Respiratory: Positive for cough, shortness of breath and wheezing.   Cardiovascular: Negative for chest pain, palpitations and leg swelling.  Gastrointestinal: Negative for abdominal pain, diarrhea, nausea and vomiting.  Genitourinary: Negative for dysuria.  Musculoskeletal: Negative for back pain.  Skin: Negative for rash.  Allergic/Immunologic: Negative for immunocompromised state.  Neurological: Negative for dizziness, weakness, numbness and headaches.  Psychiatric/Behavioral: Negative for confusion.     Physical Exam Updated Vital Signs BP (!) 141/97 (BP Location: Right Arm)   Pulse (!) 107   Temp 98.2 F (36.8 C) (Oral)   Resp 18   Ht 5\' 11"  (1.803 m)   Wt 90.7 kg   SpO2 94%   BMI 27.89 kg/m   Physical Exam Vitals signs and nursing note reviewed.  Constitutional:      Appearance: He is well-developed.  HENT:     Head: Normocephalic.  Eyes:     General: No scleral icterus.    Conjunctiva/sclera: Conjunctivae normal.  Neck:     Musculoskeletal: Normal range of motion  and neck supple.  Cardiovascular:     Rate and Rhythm: Normal rate and regular rhythm.     Pulses: Normal pulses.     Heart sounds: Normal heart sounds. No murmur. No friction rub. No gallop.   Pulmonary:     Effort: Pulmonary effort is normal. No respiratory distress.     Breath sounds: Normal breath sounds. No stridor. No wheezing, rhonchi or rales.     Comments: Lungs are clear to auscultation bilaterally.  No tachypnea or accessory muscle use. Chest:     Chest wall: No tenderness.  Abdominal:     General: There is no distension.      Palpations: Abdomen is soft.     Tenderness: There is no abdominal tenderness.  Skin:    General: Skin is warm and dry.     Capillary Refill: Capillary refill takes less than 2 seconds.  Neurological:     Mental Status: He is alert.  Psychiatric:        Behavior: Behavior normal.      ED Treatments / Results  Labs (all labs ordered are listed, but only abnormal results are displayed) Labs Reviewed - No data to display  EKG None  Radiology No results found.  Procedures Procedures (including critical care time)  Medications Ordered in ED Medications  albuterol (PROVENTIL) (2.5 MG/3ML) 0.083% nebulizer solution 5 mg (5 mg Nebulization Given 12/11/18 0001)  mometasone-formoterol (DULERA) 200-5 MCG/ACT inhaler 2 puff (2 puffs Inhalation Given 12/11/18 0155)  AEROCHAMBER PLUS FLO-VU LARGE MISC (  Given 12/11/18 0156)     Initial Impression / Assessment and Plan / ED Course  I have reviewed the triage vital signs and the nursing notes.  Pertinent labs & imaging results that were available during my care of the patient were reviewed by me and considered in my medical decision making (see chart for details).        18 year old male with a history of asthma who is out of his home albuterol inhaler.  He was given an albuterol nebulizer treatment in the ER.  On my evaluation after the initial treatment, his lungs are clear to auscultation bilaterally.  He is mildly tachycardic which I suspect is secondary to albuterol.  No hypoxia.  He is afebrile and normotensive.  He states that his symptoms feel consistent with his usual asthma flares.  It sounds as if he has been using his daily rescue inhaler 10 to 12 puffs daily for the last 4 to 5 months.  I think the patient would benefit from a long-acting inhaled corticosteroid and LABA.  Dulera inhaler with spacer has been provided for the patient in the ER as well as an albuterol inhaler refill.  He reports that he is feeling much better and  ready for discharge.  Encourage the patient to use his maintenance inhaler daily and follow-up with his PCP at his appointment as he may benefit from maintenance medication.  Low suspicion for PE, ACS, or pneumonia.  Strict return precautions given.  Doubt flu.  He is safe for discharge home with outpatient follow-up at this time.  Final Clinical Impressions(s) / ED Diagnoses   Final diagnoses:  Moderate persistent asthma with exacerbation    ED Discharge Orders    None       Barkley Boards, PA-C 12/11/18 1041    Glynn Octave, MD 12/11/18 2002

## 2018-12-11 NOTE — Discharge Instructions (Signed)
Thank you for allowing me to care for you today in the Emergency Department.   Keep your appointment on March 9 with your primary care provider.  Elwin Sleight is a maintenance medication.  You should use 2 puffs of this medication morning and night even if you are not having symptoms.  If this medication is helping to improve your symptoms, your primary care provider can prescribe this medication or one like it to help prevent flareups of your asthma.  You should not take this medication if you are having an acute exacerbation of your asthma with shortness of breath.  This is a preventative/maintenance medication.  If you develop shortness of breath and wheezing, take 2 puffs of the albuterol inhaler.  Do not use this medication more than once every 4 hours.  Use of both of these medications with the spacer to improve how well they work.  This will also help you to avoid thrush, and infection on your tongue.  Return to the emergency department if you develop shortness of breath despite using albuterol, if you pass out, have severe wheezing despite using albuterol, or other new, concerning medications.

## 2018-12-11 NOTE — ED Triage Notes (Signed)
Pt arrives POV for eval of SOB. States he ran out of his inhaler today, NARD in triage however wheezes in all fields. Albuterol neb given in triage per standing order.

## 2018-12-22 ENCOUNTER — Ambulatory Visit: Payer: Medicaid Other | Attending: Nurse Practitioner | Admitting: Nurse Practitioner

## 2018-12-22 ENCOUNTER — Encounter: Payer: Self-pay | Admitting: Nurse Practitioner

## 2018-12-22 VITALS — BP 113/77 | HR 84 | Temp 97.6°F | Ht 71.0 in | Wt 225.0 lb

## 2018-12-22 DIAGNOSIS — J4530 Mild persistent asthma, uncomplicated: Secondary | ICD-10-CM | POA: Diagnosis not present

## 2018-12-22 DIAGNOSIS — Z79899 Other long term (current) drug therapy: Secondary | ICD-10-CM | POA: Diagnosis not present

## 2018-12-22 MED ORDER — MONTELUKAST SODIUM 10 MG PO TABS: 10.0000 mg | ORAL_TABLET | Freq: Every day | ORAL | 0 refills | Status: DC

## 2018-12-22 MED ORDER — BUDESONIDE 90 MCG/ACT IN AEPB: 2.0000 | INHALATION_SPRAY | Freq: Two times a day (BID) | RESPIRATORY_TRACT | 6 refills | Status: DC

## 2018-12-22 MED ORDER — ALBUTEROL SULFATE HFA 108 (90 BASE) MCG/ACT IN AERS: 1.0000 | INHALATION_SPRAY | Freq: Four times a day (QID) | RESPIRATORY_TRACT | 1 refills | Status: DC | PRN

## 2018-12-22 NOTE — Patient Instructions (Signed)
Asthma Action Plan, Adult Introduction An asthma action plan helps you understand how to manage your asthma and what to do when you have an asthma attack. The action plan is a color-coded plan that lists the symptoms that indicate whether or not your condition is under control and what actions to take.  If you have symptoms in the green zone, it means you are doing well.  If you have symptoms in the yellow zone, it means you are having problems.  If you have symptoms in the red zone, you need medical care right away. Follow the plan that you and your health care provider develop. Review your plan with your health care provider at each visit. What triggers your asthma? Knowing the things that can trigger an asthma attack or make your asthma symptoms worse is very important. Talk to your health care provider about your asthma triggers and how to avoid them. Record your known asthma triggers here: _______________ What is your personal best peak flow reading? If you use a peak flow meter, determine your personal best reading. Record it here: _______________ Red zone Symptoms in this zone mean that you should get medical help right away. You will likely feel distressed and have symptoms at rest that restrict your activity. You are in the red zone if:  You are breathing hard and quickly.  Your nose opens wide, your ribs show, and your neck muscles become visible when you breathe in.  Your lips, fingers, or toes are a bluish color.  You have trouble speaking in full sentences.  Your peak flow reading is less than __________ (less than 50% of your personal best).  Your symptoms do not improve within 15-20 minutes after you use your reliever or rescue medicine (bronchodilator). If you have any of these symptoms:  Call your local emergency services (911 in the [place] or go to the nearest emergency room.  Use your reliever or rescue medicine. ? Start a nebulizer treatment or take 2-4 puffs from  a metered-dose inhaler with a spacer. ? Repeat this action every 15-20 minutes until help arrives. Yellow zone Symptoms in this zone mean that your condition may be getting worse. You may have symptoms that interfere with exercise, are noticeably worse after exposure to triggers, or are worse at the first sign of a cold (upper respiratory infection). These may include:  Waking from sleep.  Coughing, especially at night or first thing in the morning.  Mild wheezing.  Chest tightness.  A peak flow reading that is __________ to __________ (50-79% of your personal best). If you have any of these symptoms:  Add the following medicine to the ones that you use daily: ? Reliever or rescue medicine and dosage: _______________ ? Additional medicine and dosage: _______________ Call your health care provider if:  You remain in the yellow zone for __________ hours.  You are using a reliever or rescue medicine more than 2-3 times a week. Green zone This zone means that your asthma is under control. You may not have any symptoms while you are in the green zone. This means that you:  Have no coughing or wheezing, even while you are working or playing.  Sleep through the night.  Are breathing well.  Have a peak flow reading that is above __________ (80% of your personal best or greater). If you are in the green zone, continue to manage your asthma as directed:  Take these medicines every day: ? Controller medicine and dosage: _______________ ? Controller medicine  and dosage: _______________ ? Controller medicine and dosage: _______________ ? Controller medicine and dosage: _______________  Before exercise, use this reliever or rescue medicine: _______________ Call your health care provider if you are using a reliever or rescue medicine more than 2-3 times a week. Where to find more information You can find more information about asthma from:  Centers for Disease Control and Prevention:  SendThoughts.com.pt  American Lung Association: www.lung.org This information is not intended to replace advice given to you by your health care provider. Make sure you discuss any questions you have with your health care provider. Document Released: 07/29/2009 Document Revised: 06/12/2017 Document Reviewed: 06/12/2017 Elsevier Interactive Patient Education  2019 [employer].  How to Use a Metered Dose Inhaler A metered dose inhaler is a handheld device for taking medicine that must be breathed into the lungs (inhaled). The device can be used to deliver a variety of inhaled medicines, including:  Quick relief or rescue medicines, such as bronchodilators.  Controller medicines, such as corticosteroids. The medicine is delivered by pushing down on a metal canister to release a preset amount of spray and medicine. Each device contains the amount of medicine that is needed for a preset number of uses (inhalations). Your health care provider may recommend that you use a spacer with your inhaler to help you take the medicine more effectively. A spacer is a plastic tube with a mouthpiece on one end and an opening that connects to the inhaler on the other end. A spacer holds the medicine in a tube for a short time, which allows you to inhale more medicine. What are the risks? If you do not use your inhaler correctly, medicine might not reach your lungs to help you breathe. Inhaler medicine can cause side effects, such as:  Mouth or throat infection.  Cough.  Hoarseness.  Headache.  Nausea and vomiting.  Lung infection (pneumonia) in people who have a lung condition called COPD. How to use a metered dose inhaler without a spacer  1. Remove the cap from the inhaler. 2. If you are using the inhaler for the first time, shake it for 5 seconds, turn it away from your face, then release 4 puffs into the air. This is called priming. 3. Shake the inhaler for 5 seconds. 4. Position the inhaler so  the top of the canister faces up. 5. Put your index finger on the top of the medicine canister. Support the bottom of the inhaler with your thumb. 6. Breathe out normally and as completely as possible, away from the inhaler. 7. Either place the inhaler between your teeth and close your lips tightly around the mouthpiece, or hold the inhaler 1-2 inches (2.5-5 cm) away from your open mouth. Keep your tongue down out of the way. If you are unsure which technique to use, ask your health care provider. 8. Press the canister down with your index finger to release the medicine, then inhale deeply and slowly through your mouth (not your nose) until your lungs are completely filled. Inhaling should take 4-6 seconds. 9. Hold the medicine in your lungs for 5-10 seconds (10 seconds is best). This helps the medicine get into the small airways of your lungs. 10. With your lips in a tight circle (pursed), breathe out slowly. 11. Repeat steps 3-10 until you have taken the number of puffs that your health care provider directed. Wait about 1 minute between puffs or as directed. 12. Put the cap on the inhaler. 13. If you are using  a steroid inhaler, rinse your mouth with water, gargle, and spit out the water. Do not swallow the water. How to use a metered dose inhaler with a spacer  1. Remove the cap from the inhaler. 2. If you are using the inhaler for the first time, shake it for 5 seconds, turn it away from your face, then release 4 puffs into the air. This is called priming. 3. Shake the inhaler for 5 seconds. 4. Place the open end of the spacer onto the inhaler mouthpiece. 5. Position the inhaler so the top of the canister faces up and the spacer mouthpiece faces you. 6. Put your index finger on the top of the medicine canister. Support the bottom of the inhaler and the spacer with your thumb. 7. Breathe out normally and as completely as possible, away from the spacer. 8. Place the spacer between your teeth  and close your lips tightly around it. Keep your tongue down out of the way. 9. Press the canister down with your index finger to release the medicine, then inhale deeply and slowly through your mouth (not your nose) until your lungs are completely filled. Inhaling should take 4-6 seconds. 10. Hold the medicine in your lungs for 5-10 seconds (10 seconds is best). This helps the medicine get into the small airways of your lungs. 11. With your lips in a tight circle (pursed), breathe out slowly. 12. Repeat steps 3-11 until you have taken the number of puffs that your health care provider directed. Wait about 1 minute between puffs or as directed. 13. Remove the spacer from the inhaler and put the cap on the inhaler. 14. If you are using a steroid inhaler, rinse your mouth with water, gargle, and spit out the water. Do not swallow the water. Follow these instructions at home:  Take your inhaled medicine only as told by your health care provider. Do not use the inhaler more than directed by your health care provider.  Keep all follow-up visits as told by your health care provider. This is important.  If your inhaler has a counter, you can check it to determine how full your inhaler is. If your inhaler does not have a counter, ask your health care provider when you will need to refill your inhaler and write the refill date on a calendar or on your inhaler canister. Note that you cannot know when an inhaler is empty by shaking it.  Follow directions on the package insert for care and cleaning of your inhaler and spacer. Contact a health care provider if:  Symptoms are only partially relieved with your inhaler.  You are having trouble using your inhaler.  You have an increase in phlegm.  You have headaches. Get help right away if:  You feel little or no relief after using your inhaler.  You have dizziness.  You have a fast heart rate.  You have chills or a fever.  You have night  sweats.  There is blood in your phlegm. Summary  A metered dose inhaler is a handheld device for taking medicine that must be breathed into the lungs (inhaled).  The medicine is delivered by pushing down on a metal canister to release a preset amount of spray and medicine.  Each device contains the amount of medicine that is needed for a preset number of uses (inhalations). This information is not intended to replace advice given to you by your health care provider. Make sure you discuss any questions you have with your health  care provider. Document Released: 10/01/2005 Document Revised: 04/22/2017 Document Reviewed: 08/21/2016 Elsevier Interactive Patient Education  2019 [employer].  How to Use a Metered Dose Inhaler A metered dose inhaler is a handheld device for taking medicine that must be breathed into the lungs (inhaled). The device can be used to deliver a variety of inhaled medicines, including:  Quick relief or rescue medicines, such as bronchodilators.  Controller medicines, such as corticosteroids. The medicine is delivered by pushing down on a metal canister to release a preset amount of spray and medicine. Each device contains the amount of medicine that is needed for a preset number of uses (inhalations). Your health care provider may recommend that you use a spacer with your inhaler to help you take the medicine more effectively. A spacer is a plastic tube with a mouthpiece on one end and an opening that connects to the inhaler on the other end. A spacer holds the medicine in a tube for a short time, which allows you to inhale more medicine. What are the risks? If you do not use your inhaler correctly, medicine might not reach your lungs to help you breathe. Inhaler medicine can cause side effects, such as:  Mouth or throat infection.  Cough.  Hoarseness.  Headache.  Nausea and vomiting.  Lung infection (pneumonia) in people who have a lung condition called  COPD. How to use a metered dose inhaler without a spacer  14. Remove the cap from the inhaler. 15. If you are using the inhaler for the first time, shake it for 5 seconds, turn it away from your face, then release 4 puffs into the air. This is called priming. 16. Shake the inhaler for 5 seconds. 17. Position the inhaler so the top of the canister faces up. 18. Put your index finger on the top of the medicine canister. Support the bottom of the inhaler with your thumb. 19. Breathe out normally and as completely as possible, away from the inhaler. 20. Either place the inhaler between your teeth and close your lips tightly around the mouthpiece, or hold the inhaler 1-2 inches (2.5-5 cm) away from your open mouth. Keep your tongue down out of the way. If you are unsure which technique to use, ask your health care provider. 21. Press the canister down with your index finger to release the medicine, then inhale deeply and slowly through your mouth (not your nose) until your lungs are completely filled. Inhaling should take 4-6 seconds. 22. Hold the medicine in your lungs for 5-10 seconds (10 seconds is best). This helps the medicine get into the small airways of your lungs. 23. With your lips in a tight circle (pursed), breathe out slowly. 24. Repeat steps 3-10 until you have taken the number of puffs that your health care provider directed. Wait about 1 minute between puffs or as directed. 25. Put the cap on the inhaler. 26. If you are using a steroid inhaler, rinse your mouth with water, gargle, and spit out the water. Do not swallow the water. How to use a metered dose inhaler with a spacer  15. Remove the cap from the inhaler. 16. If you are using the inhaler for the first time, shake it for 5 seconds, turn it away from your face, then release 4 puffs into the air. This is called priming. 17. Shake the inhaler for 5 seconds. 18. Place the open end of the spacer onto the inhaler  mouthpiece. 19. Position the inhaler so the top of the  canister faces up and the spacer mouthpiece faces you. 20. Put your index finger on the top of the medicine canister. Support the bottom of the inhaler and the spacer with your thumb. 21. Breathe out normally and as completely as possible, away from the spacer. 22. Place the spacer between your teeth and close your lips tightly around it. Keep your tongue down out of the way. 23. Press the canister down with your index finger to release the medicine, then inhale deeply and slowly through your mouth (not your nose) until your lungs are completely filled. Inhaling should take 4-6 seconds. 24. Hold the medicine in your lungs for 5-10 seconds (10 seconds is best). This helps the medicine get into the small airways of your lungs. 25. With your lips in a tight circle (pursed), breathe out slowly. 26. Repeat steps 3-11 until you have taken the number of puffs that your health care provider directed. Wait about 1 minute between puffs or as directed. 27. Remove the spacer from the inhaler and put the cap on the inhaler. 28. If you are using a steroid inhaler, rinse your mouth with water, gargle, and spit out the water. Do not swallow the water. Follow these instructions at home:  Take your inhaled medicine only as told by your health care provider. Do not use the inhaler more than directed by your health care provider.  Keep all follow-up visits as told by your health care provider. This is important.  If your inhaler has a counter, you can check it to determine how full your inhaler is. If your inhaler does not have a counter, ask your health care provider when you will need to refill your inhaler and write the refill date on a calendar or on your inhaler canister. Note that you cannot know when an inhaler is empty by shaking it.  Follow directions on the package insert for care and cleaning of your inhaler and spacer. Contact a health care provider  if:  Symptoms are only partially relieved with your inhaler.  You are having trouble using your inhaler.  You have an increase in phlegm.  You have headaches. Get help right away if:  You feel little or no relief after using your inhaler.  You have dizziness.  You have a fast heart rate.  You have chills or a fever.  You have night sweats.  There is blood in your phlegm. Summary  A metered dose inhaler is a handheld device for taking medicine that must be breathed into the lungs (inhaled).  The medicine is delivered by pushing down on a metal canister to release a preset amount of spray and medicine.  Each device contains the amount of medicine that is needed for a preset number of uses (inhalations). This information is not intended to replace advice given to you by your health care provider. Make sure you discuss any questions you have with your health care provider. Document Released: 10/01/2005 Document Revised: 04/22/2017 Document Reviewed: 08/21/2016 Elsevier Interactive Patient Education  2019 [employer]  Asthma, Adult  Asthma is a long-term (chronic) condition in which the airways get tight and narrow. The airways are the breathing passages that lead from the nose and mouth down into the lungs. A person with asthma will have times when symptoms get worse. These are called asthma attacks. They can cause coughing, whistling sounds when you breathe (wheezing), shortness of breath, and chest pain. They can make it hard to breathe. There is no cure for asthma,  but medicines and lifestyle changes can help control it. There are many things that can bring on an asthma attack or make asthma symptoms worse (triggers). Common triggers include:  Mold.  Dust.  Cigarette smoke.  Cockroaches.  Things that can cause allergy symptoms (allergens). These include animal skin flakes (dander) and pollen from trees or grass.  Things that pollute the air. These may include  household cleaners, wood smoke, smog, or chemical odors.  Cold air, weather changes, and wind.  Crying or laughing hard.  Stress.  Certain medicines or drugs.  Certain foods such as dried fruit, potato chips, and grape juice.  Infections, such as a cold or the flu.  Certain medical conditions or diseases.  Exercise or tiring activities. Asthma may be treated with medicines and by staying away from the things that cause asthma attacks. Types of medicines may include:  Controller medicines. These help prevent asthma symptoms. They are usually taken every day.  Fast-acting reliever or rescue medicines. These quickly relieve asthma symptoms. They are used as needed and provide short-term relief.  Allergy medicines if your attacks are brought on by allergens.  Medicines to help control the body's defense (immune) system. Follow these instructions at home: Avoiding triggers in your home  Change your heating and air conditioning filter often.  Limit your use of fireplaces and wood stoves.  Get rid of pests (such as roaches and mice) and their droppings.  Throw away plants if you see mold on them.  Clean your floors. Dust regularly. Use cleaning products that do not smell.  Have someone vacuum when you are not home. Use a vacuum cleaner with a HEPA filter if possible.  Replace carpet with wood, tile, or vinyl flooring. Carpet can trap animal skin flakes and dust.  Use allergy-proof pillows, mattress covers, and box spring covers.  Wash bed sheets and blankets every week in hot water. Dry them in a dryer.  Keep your bedroom free of any triggers.  Avoid pets and keep windows closed when things that cause allergy symptoms are in the air.  Use blankets that are made of polyester or cotton.  Clean bathrooms and kitchens with bleach. If possible, have someone repaint the walls in these rooms with mold-resistant paint. Keep out of the rooms that are being cleaned and  painted.  Wash your hands often with soap and water. If soap and water are not available, use hand sanitizer.  Do not allow anyone to smoke in your home. General instructions  Take over-the-counter and prescription medicines only as told by your doctor. ? Talk with your doctor if you have questions about how or when to take your medicines. ? Make note if you need to use your medicines more often than usual.  Do not use any products that contain nicotine or tobacco, such as cigarettes and e-cigarettes. If you need help quitting, ask your doctor.  Stay away from secondhand smoke.  Avoid doing things outdoors when allergen counts are high and when air quality is low.  Wear a ski mask when doing outdoor activities in the winter. The mask should cover your nose and mouth. Exercise indoors on cold days if you can.  Warm up before you exercise. Take time to cool down after exercise.  Use a peak flow meter as told by your doctor. A peak flow meter is a tool that measures how well the lungs are working.  Keep track of the peak flow meter's readings. Write them down.  Follow your asthma  action plan. This is a written plan for taking care of your asthma and treating your attacks.  Make sure you get all the shots (vaccines) that your doctor recommends. Ask your doctor about a flu shot and a pneumonia shot.  Keep all follow-up visits as told by your doctor. This is important. Contact a doctor if:  You have wheezing, shortness of breath, or a cough even while taking medicine to prevent attacks.  The mucus you cough up (sputum) is thicker than usual.  The mucus you cough up changes from clear or white to yellow, green, gray, or bloody.  You have problems from the medicine you are taking, such as: ? A rash. ? Itching. ? Swelling. ? Trouble breathing.  You need reliever medicines more than 2-3 times a week.  Your peak flow reading is still at 50-79% of your personal best after following  the action plan for 1 hour.  You have a fever. Get help right away if:  You seem to be worse and are not responding to medicine during an asthma attack.  You are short of breath even at rest.  You get short of breath when doing very little activity.  You have trouble eating, drinking, or talking.  You have chest pain or tightness.  You have a fast heartbeat.  Your lips or fingernails start to turn blue.  You are light-headed or dizzy, or you faint.  Your peak flow is less than 50% of your personal best.  You feel too tired to breathe normally. Summary  Asthma is a long-term (chronic) condition in which the airways get tight and narrow. An asthma attack can make it hard to breathe.  Asthma cannot be cured, but medicines and lifestyle changes can help control it.  Make sure you understand how to avoid triggers and how and when to use your medicines. This information is not intended to replace advice given to you by your health care provider. Make sure you discuss any questions you have with your health care provider. Document Released: 03/19/2008 Document Revised: 11/05/2016 Document Reviewed: 11/05/2016 Elsevier Interactive Patient Education  2019 [employer].

## 2018-12-22 NOTE — Progress Notes (Signed)
Assessment & Plan:  Hector Gross was seen today for new patient (initial visit).  Diagnoses and all orders for this visit:  Uncontrolled mild persistent asthma -     montelukast (SINGULAIR) 10 MG tablet; Take 1 tablet (10 mg total) by mouth at bedtime for 30 days. -     albuterol (PROVENTIL HFA;VENTOLIN HFA) 108 (90 Base) MCG/ACT inhaler; Inhale 1-2 puffs into the lungs every 6 (six) hours as needed for wheezing or shortness of breath. -     Budesonide (PULMICORT FLEXHALER) 90 MCG/ACT inhaler; Inhale 2 puffs into the lungs 2 (two) times daily. Discussed asthma action plan today.  Patient has been counseled on age-appropriate routine health concerns for screening and prevention. These are reviewed and up-to-date. Referrals have been placed accordingly. Immunizations are up-to-date or declined.    Subjective:   Chief Complaint  Patient presents with  . New Patient (Initial Visit)    Patient is here to establish care.    HPI Hector Gross 18 y.o. male presents to office today to establish care.  He has a past medical history of poorly controlled asthma.  He is not sexually active.   Asthma: Not well controlled. He has had 6 ED visits over the past 6 months for poorly controlled.  Aggravating factors: Temperature changes.  Patient reports when he  is sleeping at night and gets hot, he starts having asthma symptoms.  He begins coughing, wheezing and becomes dyspneic.  Diagnosed with asthma few years ago.  He has not been using his Pulmicort inhaler correctly and has been using it on a as needed basis along with his albuterol inhaler.  Education was given today regarding the daily use of Pulmicort that is required and the as needed use of albuterol.  He verbalized understanding.  He has not been hospitalized or intubated for asthma exacerbation.  Patient denies smoking or vaping.  He states he exercises regularly and has never had an asthma attack during exercise.    Review of Systems    Constitutional: Negative for chills, fever, malaise/fatigue and weight loss.  HENT: Negative.  Negative for nosebleeds.   Eyes: Negative.  Negative for blurred vision, double vision and photophobia.  Respiratory: Positive for cough, shortness of breath and wheezing.   Cardiovascular: Negative.  Negative for chest pain, palpitations and leg swelling.  Gastrointestinal: Negative.  Negative for heartburn, nausea and vomiting.  Musculoskeletal: Negative.  Negative for myalgias.  Neurological: Negative.  Negative for dizziness, focal weakness, seizures and headaches.  Psychiatric/Behavioral: Negative.  Negative for suicidal ideas.    Past Medical History:  Diagnosis Date  . Asthma     History reviewed. No pertinent surgical history.  History reviewed. No pertinent family history.  Social History Reviewed with no changes to be made today.   Outpatient Medications Prior to Visit  Medication Sig Dispense Refill  . albuterol (PROVENTIL HFA;VENTOLIN HFA) 108 (90 Base) MCG/ACT inhaler Inhale 1-2 puffs into the lungs every 6 (six) hours as needed for wheezing or shortness of breath. 1 Inhaler 1  . Budesonide (PULMICORT FLEXHALER) 90 MCG/ACT inhaler Inhale 1 puff into the lungs 2 (two) times daily. 1 Inhaler 1  . predniSONE (DELTASONE) 50 MG tablet Take 1 tablet (50 mg total) by mouth daily. (Patient not taking: Reported on 12/22/2018) 5 tablet 0   No facility-administered medications prior to visit.     No Known Allergies     Objective:    BP 113/77 (BP Location: Left Arm, Patient Position: Sitting, Cuff Size:  Large)   Pulse 84   Temp 97.6 F (36.4 C) (Oral)   Ht 5\' 11"  (1.803 m)   Wt 225 lb (102.1 kg)   SpO2 97%   BMI 31.38 kg/m  Wt Readings from Last 3 Encounters:  12/22/18 225 lb (102.1 kg) (97 %, Z= 1.94)*  12/11/18 200 lb (90.7 kg) (92 %, Z= 1.39)*  10/02/18 200 lb (90.7 kg) (92 %, Z= 1.41)*   * Growth percentiles are based on CDC (Boys, 2-20 Years) data.    Physical  Exam Vitals signs and nursing note reviewed.  Constitutional:      Appearance: Normal appearance. He is well-developed.  HENT:     Head: Normocephalic and atraumatic.  Neck:     Musculoskeletal: Normal range of motion.  Cardiovascular:     Rate and Rhythm: Normal rate and regular rhythm.     Heart sounds: Normal heart sounds. No murmur. No friction rub. No gallop.   Pulmonary:     Effort: Pulmonary effort is normal. No tachypnea or respiratory distress.     Breath sounds: Normal breath sounds. No decreased breath sounds, wheezing, rhonchi or rales.  Chest:     Chest wall: No tenderness.  Abdominal:     General: Bowel sounds are normal.     Palpations: Abdomen is soft.  Musculoskeletal: Normal range of motion.  Skin:    General: Skin is warm and dry.  Neurological:     Mental Status: He is alert and oriented to person, place, and time.     Coordination: Coordination normal.  Psychiatric:        Behavior: Behavior normal. Behavior is cooperative.        Thought Content: Thought content normal.        Judgment: Judgment normal.          Patient has been counseled extensively about nutrition and exercise as well as the importance of adherence with medications and regular follow-up. The patient was given clear instructions to go to ER or return to medical center if symptoms don't improve, worsen or new problems develop. The patient verbalized understanding.   Follow-up: Return in about 3 weeks (around 01/12/2019).   [name], FNP-BC [facility] [place], [place] [phone]   12/22/2018, 2:24 PM

## 2019-01-15 ENCOUNTER — Telehealth: Payer: Self-pay | Admitting: Nurse Practitioner

## 2019-01-15 ENCOUNTER — Other Ambulatory Visit: Payer: Self-pay

## 2019-01-15 ENCOUNTER — Ambulatory Visit: Payer: Medicaid Other | Attending: Critical Care Medicine | Admitting: Critical Care Medicine

## 2019-01-15 DIAGNOSIS — J4530 Mild persistent asthma, uncomplicated: Secondary | ICD-10-CM

## 2019-01-15 NOTE — Progress Notes (Signed)
Patient ID: Hector Gross, male   DOB: 25-Dec-2000, 18 y.o.   MRN: [id number]   When I called the patient he stated it was not a good time for the call. He said his breathing was stable with no issues.    He wanted to reschedule.  I will ask scheduling to reschedule this patient.

## 2019-01-15 NOTE — Telephone Encounter (Signed)
Called patient and LVM to reschedule his televisit that he had today.

## 2019-01-15 NOTE — Progress Notes (Deleted)
Virtual Visit via Telephone Note  I connected with Hector Gross on 01/15/19 at  1:30 PM EDT by telephone and verified that I am speaking with the correct person using two identifiers.   I discussed the limitations, risks, security and privacy concerns of performing an evaluation and management service by telephone and the availability of in person appointments. I also discussed with the patient that there may be a patient responsible charge related to this service. The patient expressed understanding and agreed to proceed.   History of Present Illness: F/u from OV 12/22/18 Hector Gross 18 y.o. male presents to office today to establish care.  He has a past medical history of poorly controlled asthma.  He is not sexually active.   Asthma: Not well controlled. He has had 6 ED visits over the past 6 months for poorly controlled.  Aggravating factors: Temperature changes.  Patient reports when he  is sleeping at night and gets hot, he starts having asthma symptoms.  He begins coughing, wheezing and becomes dyspneic.  Diagnosed with asthma few years ago.  He has not been using his Pulmicort inhaler correctly and has been using it on a as needed basis along with his albuterol inhaler.  Education was given today regarding the daily use of Pulmicort that is required and the as needed use of albuterol.  He verbalized understanding.  He has not been hospitalized or intubated for asthma exacerbation.  Patient denies smoking or vaping.  He states he exercises regularly and has never had an asthma attack during exercise.     Observations/Objective:   Assessment and Plan:   Follow Up Instructions:    I discussed the assessment and treatment plan with the patient. The patient was provided an opportunity to ask questions and all were answered. The patient agreed with the plan and demonstrated an understanding of the instructions.   The patient was advised to call back or seek an in-person evaluation  if the symptoms worsen or if the condition fails to improve as anticipated.  I provided *** minutes of non-face-to-face time during this encounter.   Shan Levans, MD

## 2019-07-11 ENCOUNTER — Encounter (HOSPITAL_COMMUNITY): Payer: Self-pay | Admitting: Emergency Medicine

## 2019-07-11 ENCOUNTER — Emergency Department (HOSPITAL_COMMUNITY)
Admission: EM | Admit: 2019-07-11 | Discharge: 2019-07-11 | Disposition: A | Discharge status: Home / Self Care | Payer: Medicaid Other | Attending: Emergency Medicine | Admitting: Emergency Medicine

## 2019-07-11 ENCOUNTER — Other Ambulatory Visit: Payer: Self-pay

## 2019-07-11 DIAGNOSIS — J4521 Mild intermittent asthma with (acute) exacerbation: Secondary | ICD-10-CM | POA: Insufficient documentation

## 2019-07-11 DIAGNOSIS — J4531 Mild persistent asthma with (acute) exacerbation: Secondary | ICD-10-CM | POA: Diagnosis not present

## 2019-07-11 DIAGNOSIS — R0602 Shortness of breath: Secondary | ICD-10-CM | POA: Diagnosis present

## 2019-07-11 DIAGNOSIS — J4530 Mild persistent asthma, uncomplicated: Secondary | ICD-10-CM

## 2019-07-11 MED ORDER — PREDNISONE 20 MG PO TABS
60.0000 mg | ORAL_TABLET | Freq: Once | ORAL | Status: AC
  Administered 2019-07-11: 60 mg via ORAL
  Filled 2019-07-11: qty 3

## 2019-07-11 MED ORDER — ALBUTEROL SULFATE HFA 108 (90 BASE) MCG/ACT IN AERS: 1.0000 | INHALATION_SPRAY | Freq: Four times a day (QID) | RESPIRATORY_TRACT | 0 refills | Status: DC | PRN

## 2019-07-11 MED ORDER — PREDNISONE 10 MG PO TABS: 40.0000 mg | ORAL_TABLET | Freq: Every day | ORAL | 0 refills | Status: AC

## 2019-07-11 MED ORDER — ALBUTEROL SULFATE HFA 108 (90 BASE) MCG/ACT IN AERS
4.0000 | INHALATION_SPRAY | Freq: Once | RESPIRATORY_TRACT | Status: AC
  Administered 2019-07-11: 19:00:00 4 via RESPIRATORY_TRACT
  Filled 2019-07-11: qty 6.7

## 2019-07-11 NOTE — Discharge Instructions (Addendum)
Thank you for allowing me to care for you today. Please return to the emergency department if you have new or worsening symptoms. Take your medications as instructed.  ° °

## 2019-07-11 NOTE — ED Triage Notes (Signed)
Pt st's he has asthma and started feeling short of breath earlier today.  St's he is out of his inhaler.  Pt speaking in full sentences

## 2019-07-11 NOTE — ED Provider Notes (Signed)
[facility] EMERGENCY DEPARTMENT Provider Note   CSN: [id number] Arrival date & time: 07/11/19  1658     History   Chief Complaint Chief Complaint  Patient presents with  . Asthma    HPI Hector Gross is a 18 y.o. male.     18 year old male past medical history of asthma presenting to the emergency department for asthma symptoms.  Patient reports that yesterday he ran out of pulmonary and is now having increased shortness of breath and wheezing.  This feels like similar instances approximately the past.  Reports he usually has been having 3 times a day.  Denies any fever, chills, nausea, vomiting, chest pain, leg swelling, covid exposure.     Past Medical History:  Diagnosis Date  . Asthma     There are no active problems to display for this patient.   History reviewed. No pertinent surgical history.      Home Medications    Prior to Admission medications   Medication Sig Start Date End Date Taking? Authorizing Provider  albuterol (VENTOLIN HFA) 108 (90 Base) MCG/ACT inhaler Inhale 1-2 puffs into the lungs every 6 (six) hours as needed for wheezing or shortness of breath. 07/11/19 08/10/19  Alveria Apley, PA-C  Budesonide (PULMICORT FLEXHALER) 90 MCG/ACT inhaler Inhale 2 puffs into the lungs 2 (two) times daily. 12/22/18   Gildardo Pounds, NP  montelukast (SINGULAIR) 10 MG tablet Take 1 tablet (10 mg total) by mouth at bedtime for 30 days. 12/22/18 01/21/19  Gildardo Pounds, NP  predniSONE (DELTASONE) 10 MG tablet Take 4 tablets (40 mg total) by mouth daily for 5 days. 07/11/19 07/16/19  Alveria Apley, PA-C    Family History No family history on file.  Social History Social History   Tobacco Use  . Smoking status: Never Smoker  . Smokeless tobacco: Never Used  Substance Use Topics  . Alcohol use: No  . Drug use: No     Allergies   Patient has no known allergies.   Review of Systems Review of Systems  Constitutional: Negative for  activity change, appetite change, chills, fatigue and fever.  HENT: Negative for congestion, ear pain, rhinorrhea, sinus pain and sore throat.   Eyes: Negative for redness.  Respiratory: Positive for cough, shortness of breath and wheezing.   Cardiovascular: Negative for chest pain.  Gastrointestinal: Negative for abdominal pain, nausea and vomiting.  Genitourinary: Negative for dysuria.  Musculoskeletal: Negative for back pain and myalgias.  Skin: Negative for rash.  Allergic/Immunologic: Negative for immunocompromised state.  Neurological: Negative for dizziness and headaches.     Physical Exam Updated Vital Signs BP 138/90 (BP Location: Left Arm)   Pulse 93   Temp 97.9 F (36.6 C) (Oral)   Resp 16   Ht 5\' 11"  (1.803 m)   Wt 99.8 kg   SpO2 93%   BMI 30.68 kg/m   Physical Exam Vitals signs and nursing note reviewed.  Constitutional:      Appearance: Normal appearance.  HENT:     Head: Normocephalic.  Eyes:     Conjunctiva/sclera: Conjunctivae normal.  Cardiovascular:     Rate and Rhythm: Normal rate and regular rhythm.     Pulses: Normal pulses.     Heart sounds: Normal heart sounds.  Pulmonary:     Effort: Pulmonary effort is normal. No respiratory distress.     Breath sounds: No stridor. Wheezing and rhonchi present. No rales.  Chest:     Chest wall: No  tenderness.  Skin:    General: Skin is dry.  Neurological:     Mental Status: He is alert.  Psychiatric:        Mood and Affect: Mood normal.      ED Treatments / Results  Labs (all labs ordered are listed, but only abnormal results are displayed) Labs Reviewed - No data to display  EKG None  Radiology No results found.  Procedures Procedures (including critical care time)  Medications Ordered in ED Medications  albuterol (VENTOLIN HFA) 108 (90 Base) MCG/ACT inhaler 4 puff (has no administration in time range)  predniSONE (DELTASONE) tablet 60 mg (has no administration in time range)      Initial Impression / Assessment and Plan / ED Course  I have reviewed the triage vital signs and the nursing notes.  Pertinent labs & imaging results that were available during my care of the patient were reviewed by me and considered in my medical decision making (see chart for details).  Clinical Course as of Jul 10 1832  Sat Jul 11, 2019  1831 Patient here for asthma exacerbation after running out of inhaler. Wheezing on my exam but NAD. Given albuterol and prednisone. Advised on return precautions   [KM]    Clinical Course User Index [KM] Arlyn Dunning, PA-C       Based on review of vitals, medical screening exam, lab work and/or imaging, there does not appear to be an acute, emergent etiology for the patient's symptoms. Counseled pt on good return precautions and encouraged both PCP and ED follow-up as needed.  Prior to discharge, I also discussed incidental imaging findings with patient in detail and advised appropriate, recommended follow-up in detail.  Clinical Impression: 1. Mild intermittent asthma with exacerbation   2. Uncontrolled mild persistent asthma     Disposition: Discharge  Prior to providing a prescription for a controlled substance, I independently reviewed the patient's recent prescription history on the [place] Controlled Substance Reporting System. The patient had no recent or regular prescriptions and was deemed appropriate for a brief, less than 3 day prescription of narcotic for acute analgesia.  This note was prepared with assistance of Conservation officer, historic buildings. Occasional wrong-word or sound-a-like substitutions may have occurred due to the inherent limitations of voice recognition software.   Final Clinical Impressions(s) / ED Diagnoses   Final diagnoses:  Mild intermittent asthma with exacerbation    ED Discharge Orders         Ordered    albuterol (VENTOLIN HFA) 108 (90 Base) MCG/ACT inhaler  Every 6 hours PRN     07/11/19  1746    predniSONE (DELTASONE) 10 MG tablet  Daily     07/11/19 1746           Jeral Pinch 07/11/19 1833    Arby Barrette, MD 07/12/19 1145

## 2019-07-24 ENCOUNTER — Ambulatory Visit: Payer: Medicaid Other | Attending: Nurse Practitioner | Admitting: Nurse Practitioner

## 2019-07-24 ENCOUNTER — Encounter: Payer: Self-pay | Admitting: Nurse Practitioner

## 2019-07-24 ENCOUNTER — Other Ambulatory Visit: Payer: Self-pay

## 2019-07-24 DIAGNOSIS — J4530 Mild persistent asthma, uncomplicated: Secondary | ICD-10-CM | POA: Insufficient documentation

## 2019-07-24 DIAGNOSIS — Z130 Encounter for screening for diseases of the blood and blood-forming organs and certain disorders involving the immune mechanism: Secondary | ICD-10-CM

## 2019-07-24 DIAGNOSIS — Z13228 Encounter for screening for other metabolic disorders: Secondary | ICD-10-CM

## 2019-07-24 NOTE — Progress Notes (Signed)
Virtual Visit via Telephone Note Due to national recommendations of social distancing due to [place] 19, telehealth visit is felt to be most appropriate for this patient at this time.  I discussed the limitations, risks, security and privacy concerns of performing an evaluation and management service by telephone and the availability of in person appointments. I also discussed with the patient that there may be a patient responsible charge related to this service. The patient expressed understanding and agreed to proceed.    I connected with Hector Gross on 07/24/19  at   3:50 PM EDT  EDT by telephone and verified that I am speaking with the correct person using two identifiers.   Consent I discussed the limitations, risks, security and privacy concerns of performing an evaluation and management service by telephone and the availability of in person appointments. I also discussed with the patient that there may be a patient responsible charge related to this service. The patient expressed understanding and agreed to proceed.   Location of Patient: [place] of Provider: [facility]   Persons participating in Telemedicine visit: [name] FNP-BC [place]    History of Present Illness: Telemedicine visit for: F/U Asthma  He has had 7 ED visits over the past 12 months for poorly controlled asthma.  Most of his visits are due to poor compliance.  Aggravating factors: Temperature changes.  Patient reports when he  is sleeping at night and gets hot, he starts having asthma symptoms.  He begins coughing, wheezing and becomes dyspneic.  Diagnosed with asthma few years ago.   At his last tele visit with me in March it was noted that he was not using his Pulmicort inhaler correctly and had been using it on a as needed basis along with his albuterol inhaler.  Education was given at that time regarding the daily use of Pulmicort that is required  and the as needed use of albuterol.  He verbalized understanding.  He has not been hospitalized or intubated for asthma exacerbation.  Patient denies smoking or vaping.  He states he exercises regularly and has never had an asthma attack during exercise. He was referred to our Pulmonologist Dr. Joya Gaskins for Asthma follow up after his appt with me. Unfortunately Dr. Joya Gaskins attempted to connect with him for his appointment the following month and [name] told him it was not a good time to talk so he was not able to discuss asthma management with him that day.  Today he has since been treated in the ED once again for poorly controlled asthma. When I asked him about his pulmicort and how when he uses it he states all the time. I was confused so I then asked him when he was using his albuterol and he said that doesn't work for me. He then became angry when I asked him how many puffs of his pulmicort he was supposed to be administering and he stated "I know how to use my inhaler"!. He has not been taking his singulair at night. I called the pharmacy and was told Tsosie Billing picked up his first inhaler that I ordered from him 2 weeks after the script was sent and he just now picked up his 2nd refill 5 days ago. So between March and October 5th [name] has only filled his pulmicort twice.   Tsosie Billing was very angry today and asked could he speak to someone else today. I stated that I am his  PCP and was only trying to make sure he knows how to use his inhalers to avoid frequent ED visits.  I would still like for him to follow up with our Pulmonologist regarding asthma education as he is not very receptive to me today.      Past Medical History:  Diagnosis Date  . Asthma     History reviewed. No pertinent surgical history.  History reviewed. No pertinent family history.  Social History   Socioeconomic History  . Marital status: Single    Spouse name: Not on file  . Number of children: Not on file  . Years  of education: Not on file  . Highest education level: Not on file  Occupational History  . Not on file  Social Needs  . Financial resource strain: Not on file  . Food insecurity    Worry: Not on file    Inability: Not on file  . Transportation needs    Medical: Not on file    Non-medical: Not on file  Tobacco Use  . Smoking status: Never Smoker  . Smokeless tobacco: Never Used  Substance and Sexual Activity  . Alcohol use: No  . Drug use: No  . Sexual activity: Not Currently  Lifestyle  . Physical activity    Days per week: Not on file    Minutes per session: Not on file  . Stress: Not on file  Relationships  . Social Musician on phone: Not on file    Gets together: Not on file    Attends religious service: Not on file    Active member of club or organization: Not on file    Attends meetings of clubs or organizations: Not on file    Relationship status: Not on file  Other Topics Concern  . Not on file  Social History Narrative  . Not on file     Observations/Objective: Awake, alert and oriented x 3   Review of Systems  Constitutional: Negative for fever, malaise/fatigue and weight loss.  HENT: Negative.  Negative for nosebleeds.   Eyes: Negative.  Negative for blurred vision, double vision and photophobia.  Respiratory: Positive for shortness of breath. Negative for cough and wheezing.   Cardiovascular: Negative.  Negative for chest pain, palpitations and leg swelling.  Gastrointestinal: Negative.  Negative for heartburn, nausea and vomiting.  Musculoskeletal: Negative.  Negative for myalgias.  Neurological: Negative.  Negative for dizziness, focal weakness, seizures and headaches.  Endo/Heme/Allergies: Positive for environmental allergies.  Psychiatric/Behavioral: Negative.  Negative for suicidal ideas.    Assessment and Plan: Diagnoses and all orders for this visit:  Uncontrolled mild persistent asthma Continue pulmicort and albuterol as  prescribed.     Follow Up Instructions Return for [facility] for asthma. See me as needed.     I discussed the assessment and treatment plan with the patient. The patient was provided an opportunity to ask questions and all were answered. The patient agreed with the plan and demonstrated an understanding of the instructions.   The patient was advised to call back or seek an in-person evaluation if the symptoms worsen or if the condition fails to improve as anticipated.  I provided 20 minutes of non-face-to-face time during this encounter including median intraservice time, reviewing previous notes, labs, imaging, medications and explaining diagnosis and management.  [name], FNP-BC

## 2019-07-25 ENCOUNTER — Encounter: Payer: Self-pay | Admitting: Nurse Practitioner

## 2019-07-27 ENCOUNTER — Other Ambulatory Visit: Payer: Medicaid Other

## 2019-07-27 ENCOUNTER — Ambulatory Visit: Payer: Medicaid Other | Admitting: Critical Care Medicine

## 2019-07-27 ENCOUNTER — Other Ambulatory Visit: Payer: Self-pay

## 2019-07-29 ENCOUNTER — Other Ambulatory Visit: Payer: Medicaid Other

## 2019-08-16 NOTE — Progress Notes (Deleted)
Patient ID: Hector Gross, male   DOB: 22-Aug-2001, 18 y.o.   MRN: [id number]  Virtual Visit via Telephone Note  I connected with Hector Gross on 08/16/19 at  9:30 AM EST by telephone and verified that I am speaking with the correct person using two identifiers.   Consent:  I discussed the limitations, risks, security and privacy concerns of performing an evaluation and management service by telephone and the availability of in person appointments. I also discussed with the patient that there may be a patient responsible charge related to this service. The patient expressed understanding and agreed to proceed.  Location of patient:  Location of provider:  Persons participating in the televisit with the patient.       History of Present Illness: Asthma Eval   Observations/Objective:   Assessment and Plan:   Follow Up Instructions:    I discussed the assessment and treatment plan with the patient. The patient was provided an opportunity to ask questions and all were answered. The patient agreed with the plan and demonstrated an understanding of the instructions.   The patient was advised to call back or seek an in-person evaluation if the symptoms worsen or if the condition fails to improve as anticipated.  I provided *** minutes of non-face-to-face time during this encounter  including  median intraservice time , review of notes, labs, imaging, medications  and explaining diagnosis and management to the patient .    Asencion Noble, MD

## 2019-08-17 ENCOUNTER — Other Ambulatory Visit: Payer: Self-pay

## 2019-08-17 ENCOUNTER — Ambulatory Visit: Payer: Medicaid Other | Attending: Critical Care Medicine | Admitting: Critical Care Medicine

## 2019-09-24 ENCOUNTER — Telehealth: Payer: Self-pay | Admitting: Nurse Practitioner

## 2019-09-24 NOTE — Telephone Encounter (Signed)
Patient called requesting a pulmonary function test. Please f/u with patient to see if this can be dome.

## 2019-09-26 NOTE — Telephone Encounter (Signed)
He was a no show with Dr. Joya Gaskins. He needs to have a visit with Dr. Joya Gaskins to determine if he needs a  PFT. Please schedule and let him know he can be dismissed for no shows. Thank you

## 2019-10-01 NOTE — Telephone Encounter (Signed)
Attempt to call patient to schedule a appt. With Dr. Joya Gaskins to determine if he needs a pulmonary function test. No answer and LVM.

## 2020-02-23 ENCOUNTER — Other Ambulatory Visit: Payer: Self-pay | Admitting: Nurse Practitioner

## 2020-02-23 DIAGNOSIS — J4530 Mild persistent asthma, uncomplicated: Secondary | ICD-10-CM

## 2020-09-23 IMAGING — DX DG CHEST 2V
2 series · 2 of 2 positions shown · non-contrast
Comparison: Radiographs July 04, 2018.

CLINICAL DATA: Productive cough.  Asthma exacerbation.

EXAM:
CHEST - 2 VIEW

[chest pa]
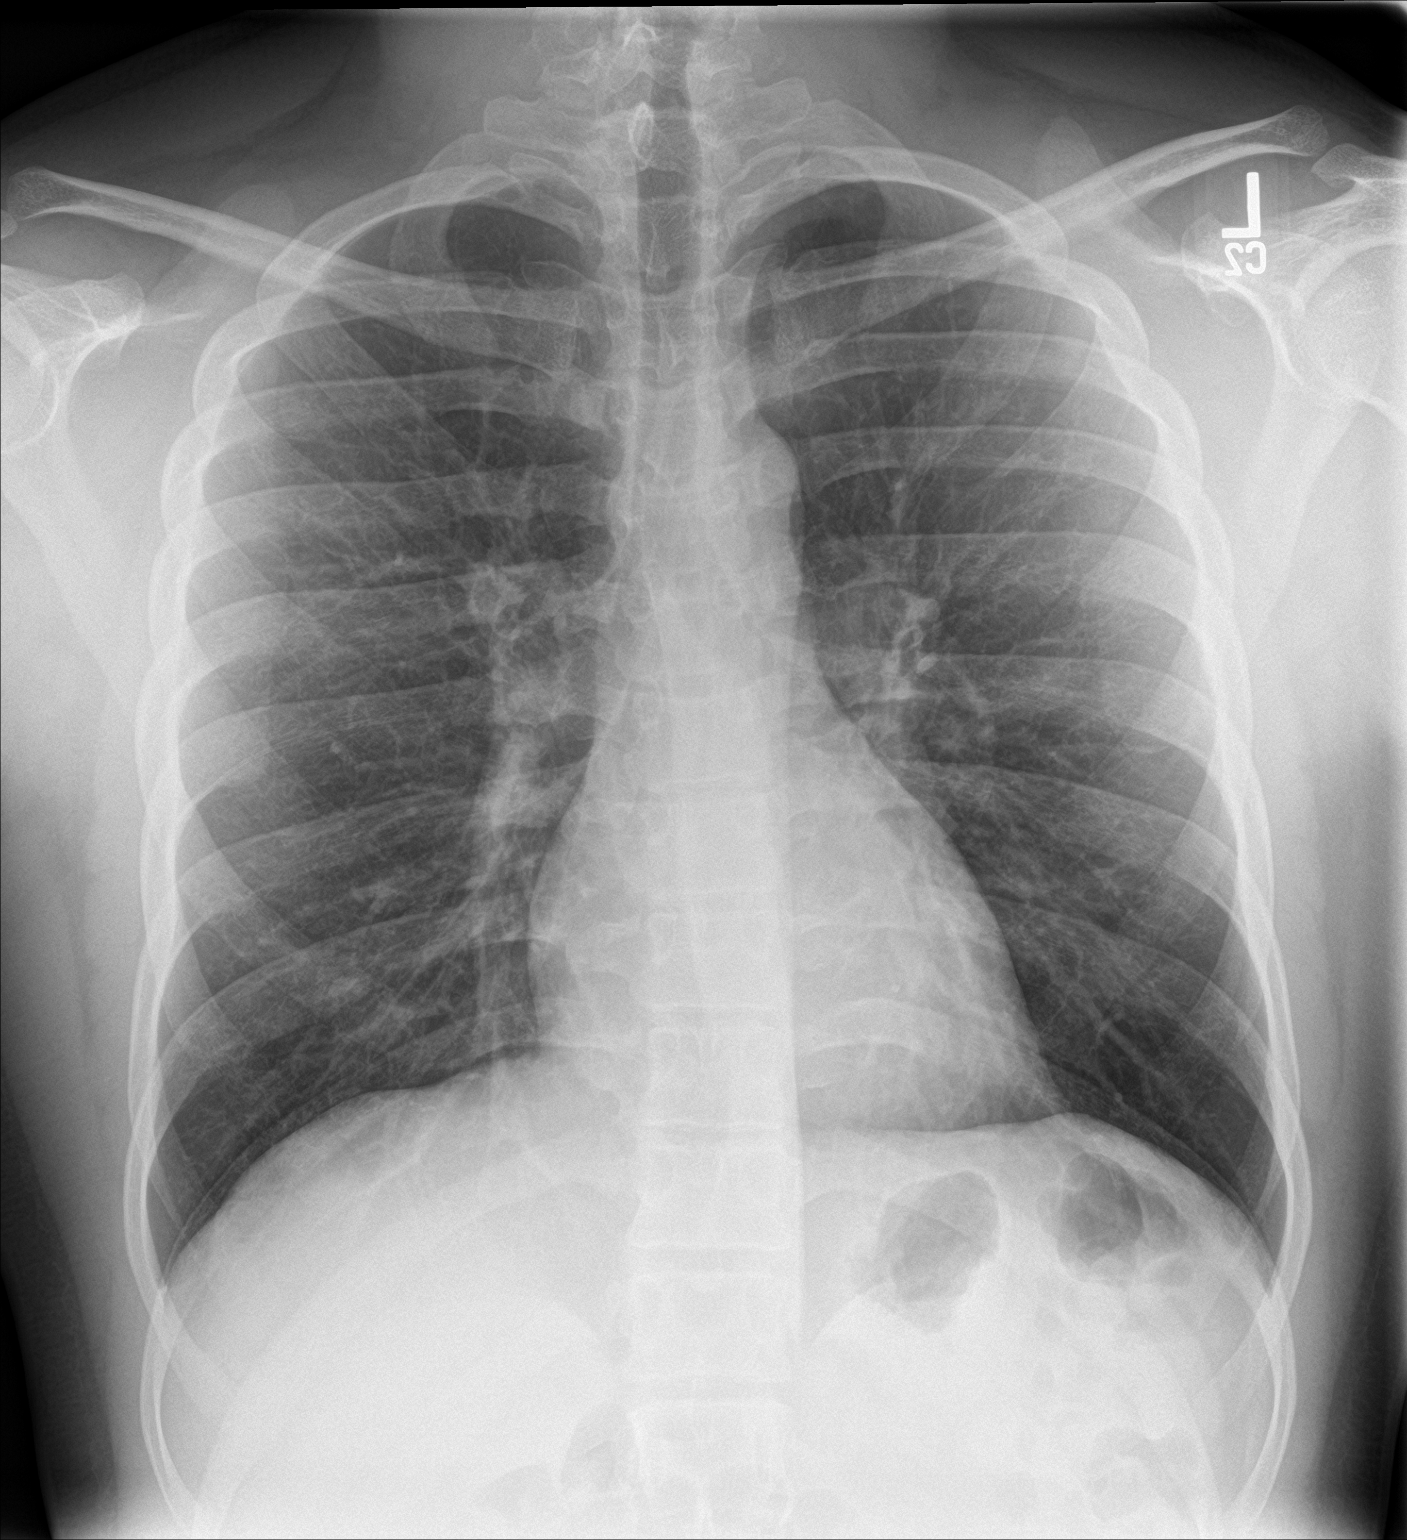

[chest lat]
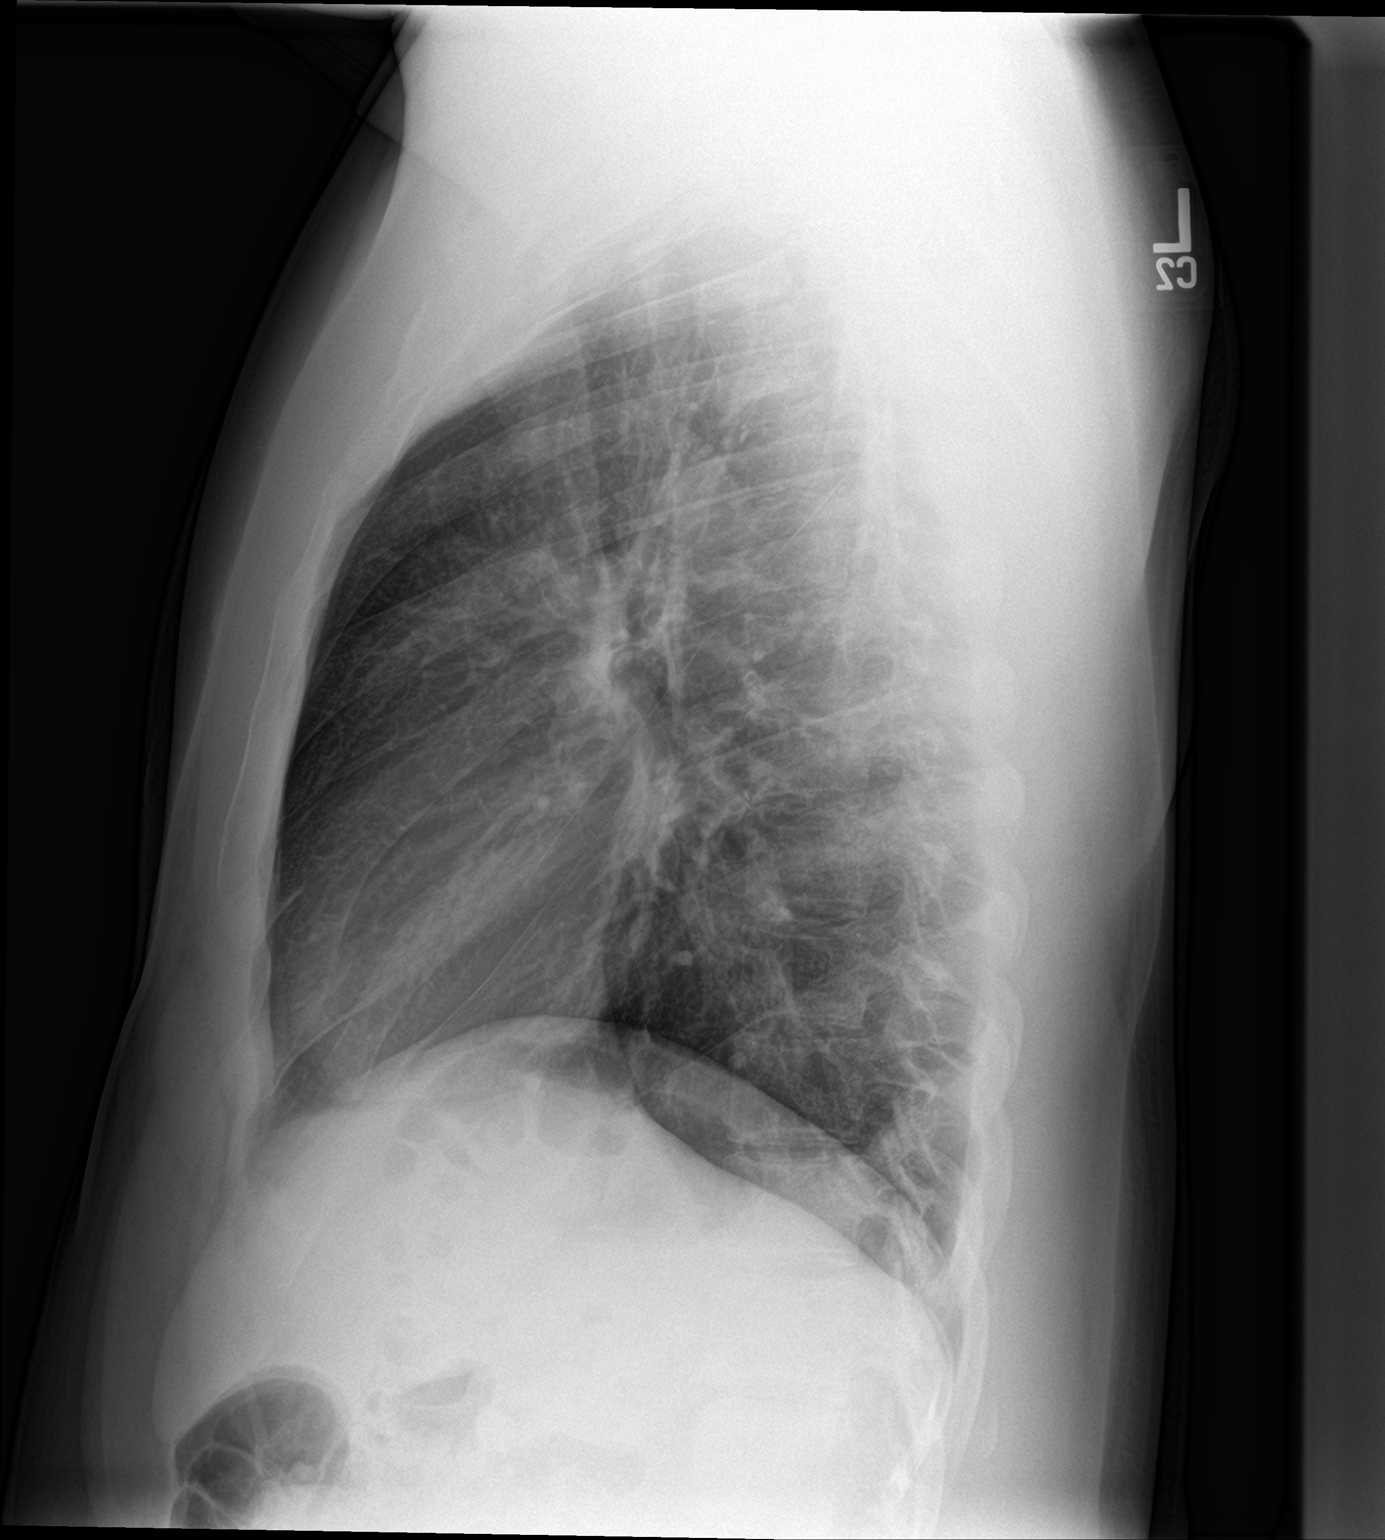

[2 of 2 positions shown; findings below may reference images not displayed]

FINDINGS: The heart size and mediastinal contours are within normal limits.
Both lungs are clear. The visualized skeletal structures are
unremarkable.
IMPRESSION: No active cardiopulmonary disease.

## 2021-02-18 ENCOUNTER — Other Ambulatory Visit: Payer: Self-pay

## 2021-02-18 ENCOUNTER — Emergency Department (HOSPITAL_COMMUNITY): Payer: Medicaid Other

## 2021-02-18 ENCOUNTER — Emergency Department (HOSPITAL_COMMUNITY)
Admission: EM | Admit: 2021-02-18 | Discharge: 2021-02-18 | Disposition: A | Discharge status: Home / Self Care | Payer: Medicaid Other | Attending: Emergency Medicine | Admitting: Emergency Medicine

## 2021-02-18 ENCOUNTER — Encounter (HOSPITAL_COMMUNITY): Payer: Self-pay | Admitting: Emergency Medicine

## 2021-02-18 DIAGNOSIS — R0602 Shortness of breath: Secondary | ICD-10-CM | POA: Diagnosis present

## 2021-02-18 DIAGNOSIS — Z7951 Long term (current) use of inhaled steroids: Secondary | ICD-10-CM | POA: Insufficient documentation

## 2021-02-18 DIAGNOSIS — J45909 Unspecified asthma, uncomplicated: Secondary | ICD-10-CM | POA: Diagnosis not present

## 2021-02-18 DIAGNOSIS — J4541 Moderate persistent asthma with (acute) exacerbation: Secondary | ICD-10-CM | POA: Diagnosis not present

## 2021-02-18 DIAGNOSIS — J4530 Mild persistent asthma, uncomplicated: Secondary | ICD-10-CM

## 2021-02-18 MED ORDER — MONTELUKAST SODIUM 10 MG PO TABS: 10.0000 mg | ORAL_TABLET | Freq: Every day | ORAL | 3 refills | Status: DC

## 2021-02-18 MED ORDER — PULMICORT FLEXHALER 90 MCG/ACT IN AEPB: 2.0000 | INHALATION_SPRAY | Freq: Two times a day (BID) | RESPIRATORY_TRACT | 3 refills | Status: DC

## 2021-02-18 MED ORDER — PREDNISONE 20 MG PO TABS
60.0000 mg | ORAL_TABLET | Freq: Once | ORAL | Status: AC
  Administered 2021-02-18: 60 mg via ORAL
  Filled 2021-02-18: qty 3

## 2021-02-18 MED ORDER — PREDNISONE 20 MG PO TABS: 40.0000 mg | ORAL_TABLET | Freq: Every day | ORAL | 0 refills | Status: DC

## 2021-02-18 MED ORDER — IPRATROPIUM-ALBUTEROL 0.5-2.5 (3) MG/3ML IN SOLN
3.0000 mL | Freq: Once | RESPIRATORY_TRACT | Status: AC
  Administered 2021-02-18: 3 mL via RESPIRATORY_TRACT
  Filled 2021-02-18: qty 3

## 2021-02-18 MED ORDER — ALBUTEROL SULFATE HFA 108 (90 BASE) MCG/ACT IN AERS: 1.0000 | INHALATION_SPRAY | RESPIRATORY_TRACT | 4 refills | Status: DC | PRN

## 2021-02-18 NOTE — ED Provider Notes (Signed)
[facility] EMERGENCY DEPARTMENT Provider Note   CSN: [id number] Arrival date & time: 02/18/21  6222     History Chief Complaint  Patient presents with  . Asthma    [facility] is a 20 y.o. male.  HPI Patient reports he has a history of asthma since he was about 20 years old.  He reports he has had increased shortness of breath over the past several days.  He reports that he has had some pain with deep inspiration.  He reports that his albuterol inhaler has expired to run out.  He denies any recent triggers.  He has not been sick with fever cough, URI symptoms, nausea or vomiting.  Patient does not smoke.  He reports that he is not on any chronic maintenance medications for his asthma.  He reports he did have a Pulmicort inhaler but describes it as being told to use more on an as-needed basis.  He denies having had PCP or pulmonology management of his asthma in the past.    Past Medical History:  Diagnosis Date  . Asthma     There are no problems to display for this patient.   History reviewed. No pertinent surgical history.     No family history on file.  Social History   Tobacco Use  . Smoking status: Never Smoker  . Smokeless tobacco: Never Used  Vaping Use  . Vaping Use: Never used  Substance Use Topics  . Alcohol use: No  . Drug use: No    Home Medications Prior to Admission medications   Medication Sig Start Date End Date Taking? Authorizing Provider  albuterol (VENTOLIN HFA) 108 (90 Base) MCG/ACT inhaler Inhale 1-2 puffs into the lungs every 4 (four) hours as needed for wheezing or shortness of breath. 02/18/21  Yes Vicktoria Muckey, Lebron Conners, MD  Budesonide (PULMICORT FLEXHALER) 90 MCG/ACT inhaler Inhale 2 puffs into the lungs 2 (two) times daily. 02/18/21  Yes Samanthamarie Ezzell, Lebron Conners, MD  montelukast (SINGULAIR) 10 MG tablet Take 1 tablet (10 mg total) by mouth at bedtime. 02/18/21  Yes Arby Barrette, MD  predniSONE (DELTASONE) 20 MG tablet Take 2 tablets  (40 mg total) by mouth daily with breakfast. 2 tabs po daily x 3 days 02/18/21  Yes Yomaris Palecek, Lebron Conners, MD  Budesonide (PULMICORT FLEXHALER) 90 MCG/ACT inhaler Inhale 2 puffs into the lungs 2 (two) times daily. 12/22/18   [name], NP  montelukast (SINGULAIR) 10 MG tablet Take 1 tablet (10 mg total) by mouth at bedtime for 30 days. 12/22/18 01/21/19  [name], NP  PROAIR HFA 108 (90 Base) MCG/ACT inhaler INHALE 1 TO 2 PUFF INTO THE LUNGS EVERY 6 HOURS AS NEEDED FOR WHEEZING OR SHORTNESS OF BREATH 02/24/20   Hoy Register, MD    Allergies    Patient has no known allergies.  Review of Systems   Review of Systems 10 Systems reviewed and negative except as per HPI Physical Exam Updated Vital Signs BP (!) 141/92   Pulse 72   Temp 97.6 F (36.4 C) (Oral)   Resp 16   Ht 5\' 11"  (1.803 m)   Wt 104.3 kg   SpO2 96%   BMI 32.08 kg/m   Physical Exam Constitutional:      Appearance: He is well-developed.  HENT:     Head: Normocephalic and atraumatic.  Eyes:     Pupils: Pupils are equal, round, and reactive to light.  Cardiovascular:     Rate and Rhythm: Normal rate and regular  rhythm.     Heart sounds: Normal heart sounds.  Pulmonary:     Effort: Pulmonary effort is normal.     Breath sounds: Wheezing present.     Comments: No respiratory distress at rest.  He has wheezing throughout the lung fields.  He does have adequate airflow to the bases. Abdominal:     General: Bowel sounds are normal. There is no distension.     Palpations: Abdomen is soft.     Tenderness: There is no abdominal tenderness.  Musculoskeletal:        General: No swelling or tenderness. Normal range of motion.     Cervical back: Neck supple.     Right lower leg: No edema.     Left lower leg: No edema.  Skin:    General: Skin is warm and dry.  Neurological:     Mental Status: He is alert and oriented to person, place, and time.     GCS: GCS eye subscore is 4. GCS verbal subscore is 5. GCS motor subscore  is 6.     Coordination: Coordination normal.     ED Results / Procedures / Treatments   Labs (all labs ordered are listed, but only abnormal results are displayed) Labs Reviewed - No data to display  EKG None  Radiology DG Chest 2 View  Result Date: 02/18/2021 CLINICAL DATA:  Asthma exacerbation, pain with inspiration EXAM: CHEST - 2 VIEW COMPARISON:  Prior chest x-ray 11/24/2018 FINDINGS: The lungs are clear and negative for focal airspace consolidation, pulmonary edema or suspicious pulmonary nodule. No pleural effusion or pneumothorax. Cardiac and mediastinal contours are within normal limits. No acute fracture or lytic or blastic osseous lesions. The visualized upper abdominal bowel gas pattern is unremarkable. IMPRESSION: Negative chest x-ray. Electronically Signed   By: Malachy Moan M.D.   On: 02/18/2021 10:29    Procedures Procedures   Medications Ordered in ED Medications  ipratropium-albuterol (DUONEB) 0.5-2.5 (3) MG/3ML nebulizer solution 3 mL (3 mLs Nebulization Given 02/18/21 1029)  predniSONE (DELTASONE) tablet 60 mg (60 mg Oral Given 02/18/21 1029)    ED Course  I have reviewed the triage vital signs and the nursing notes.  Pertinent labs & imaging results that were available during my care of the patient were reviewed by me and considered in my medical decision making (see chart for details).    MDM Rules/Calculators/A&P                          Upon recheck, patient is much improved.  Breath sounds are good.  Occasional wheeze but much better than previously.  Subjectively improved.  At this time it appears the patient is not being treated for chronic asthma.  Review of EMR indicates he had previously been prescribed Pulmicort and Singulair with Flovent inhaler for as needed use.  Patient reports he has not been on these medications for quite a while.  At this time this appears to be an acute flareup of chronic asthma.  No sign of pneumonia.  Patient is  otherwise well in appearance.  Vital signs stable.  Strongly advised patient to follow-up with a PCP for chronic management of asthma. Final Clinical Impression(s) / ED Diagnoses Final diagnoses:  Moderate persistent asthma with exacerbation    Rx / DC Orders ED Discharge Orders         Ordered    Budesonide (PULMICORT FLEXHALER) 90 MCG/ACT inhaler  2 times daily  02/18/21 1353    montelukast (SINGULAIR) 10 MG tablet  Daily at bedtime        02/18/21 1353    albuterol (VENTOLIN HFA) 108 (90 Base) MCG/ACT inhaler  Every 4 hours PRN        02/18/21 1353    predniSONE (DELTASONE) 20 MG tablet  Daily with breakfast        02/18/21 1353           Arby Barrette, MD 02/18/21 1358

## 2021-02-18 NOTE — ED Notes (Signed)
Pt d/c home per MD order. Discharge summary reviewed with pt, pt verbalizes understanding. Ambulatory off unit. No s/s of acute distress noted.  

## 2021-02-18 NOTE — Discharge Instructions (Signed)
1.  Asthma needs to be managed regularly with frequent checks by a primary care doctor and maintenance medications if indicated.  Asthma is much better controlled if it is treated as a chronic condition rather than when it has a severe flareup. 2.  Go back to your family doctor for ongoing management.  You have been prescribed medications that you previously had.  You are prescribed Singulair and Pulmicort.  Take as prescribed.  Use the albuterol inhaler as needed for increased wheezing or shortness of breath.  Take prednisone for 3 more days as prescribed for an acute flareup. 3.  Return to the emergency department immediately if you have worsening shortness of breath, chest pain or other concerning symptoms.

## 2021-02-18 NOTE — ED Notes (Signed)
Pt transported to xray 

## 2021-02-18 NOTE — ED Triage Notes (Signed)
C/o asthma flare-up since last night.  States his albuterol is expired.  Pain when he inhales.

## 2021-02-20 ENCOUNTER — Telehealth: Payer: Self-pay

## 2021-02-20 NOTE — Telephone Encounter (Signed)
Transition Care Management Unsuccessful Follow-up Telephone Call  Date of discharge and from where:  02/18/2021 from [facility]  Attempts:  1st Attempt  Reason for unsuccessful TCM follow-up call:  Left voice message

## 2021-02-21 NOTE — Telephone Encounter (Signed)
Transition Care Management Unsuccessful Follow-up Telephone Call  Date of discharge and from where:  02/18/2021 from [facility]  Attempts:  2nd Attempt  Reason for unsuccessful TCM follow-up call:  Left voice message

## 2021-02-22 NOTE — Telephone Encounter (Signed)
Transition Care Management Unsuccessful Follow-up Telephone Call  Date of discharge and from where:  02/18/2021 from [facility]  Attempts:  3rd Attempt  Reason for unsuccessful TCM follow-up call:  Unable to reach patient     

## 2021-03-30 ENCOUNTER — Other Ambulatory Visit: Payer: Self-pay

## 2021-03-30 ENCOUNTER — Emergency Department (HOSPITAL_COMMUNITY)
Admission: EM | Admit: 2021-03-30 | Discharge: 2021-03-30 | Disposition: A | Discharge status: Home / Self Care | Payer: Medicaid Other | Attending: Emergency Medicine | Admitting: Emergency Medicine

## 2021-03-30 ENCOUNTER — Encounter (HOSPITAL_COMMUNITY): Payer: Self-pay | Admitting: *Deleted

## 2021-03-30 DIAGNOSIS — Z7951 Long term (current) use of inhaled steroids: Secondary | ICD-10-CM | POA: Diagnosis not present

## 2021-03-30 DIAGNOSIS — R062 Wheezing: Secondary | ICD-10-CM

## 2021-03-30 DIAGNOSIS — R0602 Shortness of breath: Secondary | ICD-10-CM | POA: Diagnosis present

## 2021-03-30 DIAGNOSIS — J45909 Unspecified asthma, uncomplicated: Secondary | ICD-10-CM | POA: Insufficient documentation

## 2021-03-30 MED ORDER — IPRATROPIUM-ALBUTEROL 0.5-2.5 (3) MG/3ML IN SOLN
3.0000 mL | Freq: Once | RESPIRATORY_TRACT | Status: AC
  Administered 2021-03-30: 3 mL via RESPIRATORY_TRACT
  Filled 2021-03-30: qty 3

## 2021-03-30 MED ORDER — PREDNISONE 20 MG PO TABS
60.0000 mg | ORAL_TABLET | Freq: Once | ORAL | Status: AC
  Administered 2021-03-30: 60 mg via ORAL
  Filled 2021-03-30: qty 3

## 2021-03-30 MED ORDER — ALBUTEROL SULFATE HFA 108 (90 BASE) MCG/ACT IN AERS
2.0000 | INHALATION_SPRAY | Freq: Once | RESPIRATORY_TRACT | Status: AC
  Administered 2021-03-30: 2 via RESPIRATORY_TRACT
  Filled 2021-03-30: qty 6.7

## 2021-03-30 MED ORDER — PREDNISONE 10 MG (21) PO TBPK: ORAL_TABLET | ORAL | 0 refills | Status: DC

## 2021-03-30 NOTE — ED Triage Notes (Signed)
Pt reports increase in asthma symptoms and wheezing, ran out of his inhaler yesterday. No acute distress is noted at triage, speaking in full sentences. Spo2 91%.

## 2021-03-30 NOTE — Discharge Instructions (Addendum)
  Albuterol inhaler, as needed, for shortness of breath and/or wheezing.  Prednisone: Take the prednisone, as prescribed, until finished. If you are a diabetic, please know prednisone can raise your blood sugar temporarily.

## 2021-03-30 NOTE — ED Provider Notes (Signed)
Emergency Medicine Provider Triage Evaluation Note  [facility] , a 20 y.o. male  was evaluated in triage.  Pt complains of shortness of breath and wheezing he associates with asthma exacerbation states he ran out of his albuterol inhaler and is scheduled to get another one tomorrow, but states he needs albuterol today.  Review of Systems  Positive: Wheezing Negative: Fever, chest pain, dizziness, vomiting, diarrhea  Physical Exam  BP 135/85 (BP Location: Left Arm)   Pulse 97   Temp 98.5 F (36.9 C) (Oral)   Resp 18   SpO2 91%  Gen:   Awake, no distress   Resp:  Normal effort, expiratory wheezing in all fields.  Speaks in full sentences without noted difficulty. MSK:   Moves extremities without difficulty  Other:    Medical Decision Making  Medically screening exam initiated at 12:21 PM.  Appropriate orders placed.  SAED HUDLOW was informed that the remainder of the evaluation will be completed by another provider, this initial triage assessment does not replace that evaluation, and the importance of remaining in the ED until their evaluation is complete.     Anselm Pancoast, PA-C 03/30/21 1250    Rozelle Logan, DO 03/30/21 1607

## 2021-03-30 NOTE — ED Provider Notes (Signed)
[facility] EMERGENCY DEPARTMENT Provider Note   CSN: [id number] Arrival date & time: 03/30/21  1157     History Chief Complaint  Patient presents with   Asthma    [facility] is a 20 y.o. male.   Asthma Associated symptoms include shortness of breath. Pertinent negatives include no abdominal pain.      [facility] is a 20 y.o. male, with a history of asthma, presenting to the ED with wheezing and shortness of breath beginning yesterday.  Patient states this is consistent with asthma exacerbation.  He ran out of his albuterol inhaler, is scheduled to pick up a new one tomorrow, however, states he thinks he needs 1 today.  Denies fever, chest pain, abdominal pain, N/V/D, lower extremity edema, dizziness, or any other complaints.  Past Medical History:  Diagnosis Date   Asthma     There are no problems to display for this patient.   History reviewed. No pertinent surgical history.     History reviewed. No pertinent family history.  Social History   Tobacco Use   Smoking status: Never   Smokeless tobacco: Never  Vaping Use   Vaping Use: Never used  Substance Use Topics   Alcohol use: No   Drug use: No    Home Medications Prior to Admission medications   Medication Sig Start Date End Date Taking? Authorizing Provider  predniSONE (STERAPRED UNI-PAK 21 TAB) 10 MG (21) TBPK tablet Take 6 tabs (60mg ) day 1, 5 tabs (50mg ) day 2, 4 tabs (40mg ) day 3, 3 tabs (30mg ) day 4, 2 tabs (20mg ) day 5, and 1 tab (10mg ) day 6. 03/30/21  Yes Chaise Passarella C, PA-C  albuterol (VENTOLIN HFA) 108 (90 Base) MCG/ACT inhaler Inhale 1-2 puffs into the lungs every 4 (four) hours as needed for wheezing or shortness of breath. 02/18/21   , MD  Budesonide (PULMICORT FLEXHALER) 90 MCG/ACT inhaler Inhale 2 puffs into the lungs 2 (two) times daily. 12/22/18   , NP  Budesonide (PULMICORT FLEXHALER) 90 MCG/ACT inhaler Inhale 2 puffs into the lungs  2 (two) times daily. 02/18/21   04/01/21, MD  montelukast (SINGULAIR) 10 MG tablet Take 1 tablet (10 mg total) by mouth at bedtime for 30 days. 12/22/18 01/21/19  02/21/19, NP  montelukast (SINGULAIR) 10 MG tablet Take 1 tablet (10 mg total) by mouth at bedtime. 02/18/21   04/20/21, MD  PROAIR HFA 108 [phone] Base) MCG/ACT inhaler INHALE 1 TO 2 PUFF INTO THE LUNGS EVERY 6 HOURS AS NEEDED FOR WHEEZING OR SHORTNESS OF BREATH 02/24/20   03/23/19, MD    Allergies    Patient has no known allergies.  Review of Systems   Review of Systems  Constitutional:  Negative for chills and fever.  Respiratory:  Positive for shortness of breath and wheezing.   Cardiovascular:  Negative for leg swelling.  Gastrointestinal:  Negative for abdominal pain, diarrhea, nausea and vomiting.  Neurological:  Negative for syncope.  All other systems reviewed and are negative.  Physical Exam Updated Vital Signs BP 135/85 (BP Location: Left Arm)   Pulse 90   Temp 98.5 F (36.9 C) (Oral)   Resp 18   SpO2 97%   Physical Exam Vitals and nursing note reviewed.  Constitutional:      General: He is not in acute distress.    Appearance: He is well-developed. He is not diaphoretic.  HENT:     Head: Normocephalic and atraumatic.  Mouth/Throat:     Mouth: Mucous membranes are moist.     Pharynx: Oropharynx is clear.  Eyes:     Conjunctiva/sclera: Conjunctivae normal.  Cardiovascular:     Rate and Rhythm: Normal rate and regular rhythm.     Pulses: Normal pulses.          Radial pulses are 2+ on the right side and 2+ on the left side.     Heart sounds: Normal heart sounds.  Pulmonary:     Effort: No respiratory distress.     Breath sounds: Wheezing (global, expiratory) present.     Comments: No increased work of breathing.  Speaks in full sentences without difficulty. Abdominal:     Tenderness: There is no guarding.  Musculoskeletal:     Cervical back: Neck supple.     Right lower leg: No  edema.     Left lower leg: No edema.  Lymphadenopathy:     Cervical: No cervical adenopathy.  Skin:    General: Skin is warm and dry.  Neurological:     Mental Status: He is alert.  Psychiatric:        Mood and Affect: Mood and affect normal.        Speech: Speech normal.        Behavior: Behavior normal.    ED Results / Procedures / Treatments   Labs (all labs ordered are listed, but only abnormal results are displayed) Labs Reviewed - No data to display  EKG None  Radiology No results found.  Procedures Procedures   Medications Ordered in ED Medications  albuterol (VENTOLIN HFA) 108 (90 Base) MCG/ACT inhaler 2 puff (2 puffs Inhalation Given 03/30/21 1241)  ipratropium-albuterol (DUONEB) 0.5-2.5 (3) MG/3ML nebulizer solution 3 mL (3 mLs Nebulization Given 03/30/21 1241)  predniSONE (DELTASONE) tablet 60 mg (60 mg Oral Given 03/30/21 1240)    ED Course  I have reviewed the triage vital signs and the nursing notes.  Pertinent labs & imaging results that were available during my care of the patient were reviewed by me and considered in my medical decision making (see chart for details).  Clinical Course as of 03/30/21 1400  Thu Mar 30, 2021  1259 Feels better; wheezing improved [SJ]    Clinical Course User Index [SJ] Caoilainn Sacks, Hillard Danker, PA-C   MDM Rules/Calculators/A&P                          Patient presents primarily requesting an albuterol inhaler.  On assessment, he was noted to have expiratory wheezing, but no apparent distress.  He was given an albuterol inhaler to take home with him.  He was also treated with a DuoNeb here in the ED. The patient was given instructions for home care as well as return precautions. Patient voices understanding of these instructions, accepts the plan, and is comfortable with discharge.     Final Clinical Impression(s) / ED Diagnoses Final diagnoses:  Wheezing    Rx / DC Orders ED Discharge Orders          Ordered     predniSONE (STERAPRED UNI-PAK 21 TAB) 10 MG (21) TBPK tablet        03/30/21 1300             Anselm Pancoast, PA-C 03/30/21 1401    Koleen Distance, MD 03/30/21 1704

## 2021-03-31 ENCOUNTER — Telehealth: Payer: Self-pay | Admitting: *Deleted

## 2021-03-31 NOTE — Telephone Encounter (Signed)
Transition Care Management Unsuccessful Follow-up Telephone Call  Date of discharge and from where:  03/30/2021 - [facility]  Attempts:  1st Attempt  Reason for unsuccessful TCM follow-up call:  Unable to reach patient

## 2021-04-03 NOTE — Telephone Encounter (Signed)
Transition Care Management Unsuccessful Follow-up Telephone Call  Date of discharge and from where:  03/30/2021 - [facility]  Attempts:  2nd Attempt  Reason for unsuccessful TCM follow-up call:  Unable to reach patient

## 2021-04-04 NOTE — Telephone Encounter (Signed)
Transition Care Management Unsuccessful Follow-up Telephone Call  Date of discharge and from where:  03/30/2021 from [facility]  Attempts:  3rd Attempt  Reason for unsuccessful TCM follow-up call:  Unable to reach patient

## 2021-04-20 ENCOUNTER — Emergency Department (HOSPITAL_COMMUNITY)
Admission: EM | Admit: 2021-04-20 | Discharge: 2021-04-20 | Disposition: A | Discharge status: Home / Self Care | Payer: Medicaid Other | Attending: Emergency Medicine | Admitting: Emergency Medicine

## 2021-04-20 ENCOUNTER — Encounter (HOSPITAL_COMMUNITY): Payer: Self-pay | Admitting: Emergency Medicine

## 2021-04-20 DIAGNOSIS — Z5321 Procedure and treatment not carried out due to patient leaving prior to being seen by health care provider: Secondary | ICD-10-CM | POA: Insufficient documentation

## 2021-04-20 DIAGNOSIS — J45909 Unspecified asthma, uncomplicated: Secondary | ICD-10-CM | POA: Insufficient documentation

## 2021-04-20 DIAGNOSIS — R0602 Shortness of breath: Secondary | ICD-10-CM | POA: Insufficient documentation

## 2021-04-20 MED ORDER — ALBUTEROL SULFATE HFA 108 (90 BASE) MCG/ACT IN AERS
2.0000 | INHALATION_SPRAY | RESPIRATORY_TRACT | Status: DC | PRN
  Administered 2021-04-20: 2 via RESPIRATORY_TRACT
  Filled 2021-04-20: qty 6.7

## 2021-04-20 NOTE — ED Notes (Signed)
Pt left AMA due to wait time  

## 2021-04-20 NOTE — ED Triage Notes (Signed)
Patient here with complaint of shortness of breath that started last night, patient has history of asthma and takes montelukast daily with albuterol PRN. Patient states he ran out of his albuterol inhaler. Patient alert, oriented, and in no apparent distress at this time.

## 2021-04-21 ENCOUNTER — Telehealth: Payer: Self-pay

## 2021-04-21 NOTE — Telephone Encounter (Signed)
Transition Care Management Unsuccessful Follow-up Telephone Call  Date of discharge and from where:  [facility]  Attempts:  1st Attempt  Reason for unsuccessful TCM follow-up call:  Unable to reach patient

## 2021-04-23 ENCOUNTER — Encounter (HOSPITAL_COMMUNITY): Payer: Self-pay | Admitting: Emergency Medicine

## 2021-04-23 ENCOUNTER — Observation Stay (HOSPITAL_COMMUNITY)
Admission: EM | Admit: 2021-04-23 | Discharge: 2021-04-25 | Disposition: A | Discharge status: Home / Self Care | Payer: Medicaid Other | Attending: Family Medicine | Admitting: Family Medicine

## 2021-04-23 ENCOUNTER — Emergency Department (HOSPITAL_COMMUNITY): Payer: Medicaid Other

## 2021-04-23 ENCOUNTER — Other Ambulatory Visit: Payer: Self-pay

## 2021-04-23 DIAGNOSIS — R0602 Shortness of breath: Secondary | ICD-10-CM | POA: Diagnosis not present

## 2021-04-23 DIAGNOSIS — J4541 Moderate persistent asthma with (acute) exacerbation: Secondary | ICD-10-CM

## 2021-04-23 DIAGNOSIS — J4530 Mild persistent asthma, uncomplicated: Secondary | ICD-10-CM

## 2021-04-23 DIAGNOSIS — J45909 Unspecified asthma, uncomplicated: Secondary | ICD-10-CM | POA: Diagnosis not present

## 2021-04-23 DIAGNOSIS — Z79899 Other long term (current) drug therapy: Secondary | ICD-10-CM | POA: Insufficient documentation

## 2021-04-23 DIAGNOSIS — J45901 Unspecified asthma with (acute) exacerbation: Secondary | ICD-10-CM | POA: Diagnosis not present

## 2021-04-23 DIAGNOSIS — Z20822 Contact with and (suspected) exposure to covid-19: Secondary | ICD-10-CM | POA: Diagnosis not present

## 2021-04-23 LAB — COMPREHENSIVE METABOLIC PANEL
ALT: 31 U/L (ref 0–44)
AST: 30 U/L (ref 15–41)
Albumin: 4 g/dL (ref 3.5–5.0)
Alkaline Phosphatase: 69 U/L (ref 38–126)
Anion gap: 9 (ref 5–15)
BUN: 9 mg/dL (ref 6–20)
CO2: 25 mmol/L (ref 22–32)
Calcium: 9.8 mg/dL (ref 8.9–10.3)
Chloride: 104 mmol/L (ref 98–111)
Creatinine, Ser: 1 mg/dL (ref 0.61–1.24)
GFR, Estimated: >60 mL/min (ref >60)
Glucose, Bld: 92 mg/dL (ref 70–99)
Potassium: 3.8 mmol/L (ref 3.5–5.1)
Sodium: 138 mmol/L (ref 135–145)
Total Bilirubin: 0.8 mg/dL (ref 0.3–1.2)
Total Protein: 7.8 g/dL (ref 6.5–8.1)

## 2021-04-23 LAB — I-STAT VENOUS BLOOD GAS, ED
Acid-Base Excess: 1 mmol/L (ref 0.0–2.0)
Bicarbonate: 27.6 mmol/L (ref 20.0–28.0)
Calcium, Ion: 1.17 mmol/L (ref 1.15–1.40)
HCT: 51 % (ref 39.0–52.0)
Hemoglobin: 17.3 g/dL — ABNORMAL HIGH (ref 13.0–17.0)
O2 Saturation: 99 %
Potassium: 4.1 mmol/L (ref 3.5–5.1)
Sodium: 140 mmol/L (ref 135–145)
TCO2: 29 mmol/L (ref 22–32)
pCO2, Ven: 49.6 mmHg (ref 44.0–60.0)
pH, Ven: 7.353 (ref 7.250–7.430)
pO2, Ven: 126 mmHg — ABNORMAL HIGH (ref 32.0–45.0)

## 2021-04-23 LAB — CBC WITH DIFFERENTIAL/PLATELET
Abs Immature Granulocytes: 0.02 10*3/uL (ref 0.00–0.07)
Basophils Absolute: 0.1 10*3/uL (ref 0.0–0.1)
Basophils Relative: 1 %
Eosinophils Absolute: 1.1 10*3/uL — ABNORMAL HIGH (ref 0.0–0.5)
Eosinophils Relative: 14 %
HCT: 49.5 % (ref 39.0–52.0)
Hemoglobin: 16.8 g/dL (ref 13.0–17.0)
Immature Granulocytes: 0 %
Lymphocytes Relative: 22 %
Lymphs Abs: 1.8 10*3/uL (ref 0.7–4.0)
MCH: 28.4 pg (ref 26.0–34.0)
MCHC: 33.9 g/dL (ref 30.0–36.0)
MCV: 83.6 fL (ref 80.0–100.0)
Monocytes Absolute: 0.6 10*3/uL (ref 0.1–1.0)
Monocytes Relative: 8 %
Neutro Abs: 4.5 10*3/uL (ref 1.7–7.7)
Neutrophils Relative %: 55 %
Platelets: 420 10*3/uL — ABNORMAL HIGH (ref 150–400)
RBC: 5.92 MIL/uL — ABNORMAL HIGH (ref 4.22–5.81)
RDW: 12.8 % (ref 11.5–15.5)
WBC: 8.1 10*3/uL (ref 4.0–10.5)
nRBC: 0 % (ref 0.0–0.2)

## 2021-04-23 LAB — RESP PANEL BY RT-PCR (FLU A&B, COVID) ARPGX2
Influenza A by PCR: NEGATIVE
Influenza B by PCR: NEGATIVE
SARS Coronavirus 2 by RT PCR: NEGATIVE

## 2021-04-23 LAB — D-DIMER, QUANTITATIVE: D-Dimer, Quant: 0.33 ug/mL-FEU (ref 0.00–0.50)

## 2021-04-23 LAB — MAGNESIUM: Magnesium: 1.8 mg/dL (ref 1.7–2.4)

## 2021-04-23 MED ORDER — BUDESONIDE 0.25 MG/2ML IN SUSP
0.2500 mg | Freq: Two times a day (BID) | RESPIRATORY_TRACT | Status: DC
  Administered 2021-04-24 – 2021-04-25 (×4): 0.25 mg via RESPIRATORY_TRACT
  Filled 2021-04-23 (×5): qty 2

## 2021-04-23 MED ORDER — ACETAMINOPHEN 650 MG RE SUPP: 650.0000 mg | Freq: Four times a day (QID) | RECTAL | Status: DC | PRN

## 2021-04-23 MED ORDER — BUDESONIDE 90 MCG/ACT IN AEPB: INHALATION_SPRAY | Freq: Two times a day (BID) | RESPIRATORY_TRACT | Status: DC

## 2021-04-23 MED ORDER — ENOXAPARIN SODIUM 60 MG/0.6ML IJ SOSY
50.0000 mg | PREFILLED_SYRINGE | INTRAMUSCULAR | Status: DC
  Administered 2021-04-23 – 2021-04-24 (×2): 50 mg via SUBCUTANEOUS
  Filled 2021-04-23: qty 0.5
  Filled 2021-04-23: qty 0.6
  Filled 2021-04-23: qty 0.5

## 2021-04-23 MED ORDER — ACETAMINOPHEN 325 MG PO TABS: 650.0000 mg | ORAL_TABLET | Freq: Four times a day (QID) | ORAL | Status: DC | PRN

## 2021-04-23 MED ORDER — SODIUM CHLORIDE 0.9% FLUSH
3.0000 mL | Freq: Two times a day (BID) | INTRAVENOUS | Status: DC
  Administered 2021-04-23 – 2021-04-25 (×3): 3 mL via INTRAVENOUS

## 2021-04-23 MED ORDER — SODIUM CHLORIDE 0.9 % IV SOLN: 250.0000 mL | INTRAVENOUS | Status: DC | PRN

## 2021-04-23 MED ORDER — ALBUTEROL SULFATE (2.5 MG/3ML) 0.083% IN NEBU
2.5000 mg | INHALATION_SOLUTION | RESPIRATORY_TRACT | Status: DC | PRN
  Administered 2021-04-25 (×2): 2.5 mg via RESPIRATORY_TRACT
  Filled 2021-04-23 (×2): qty 3

## 2021-04-23 MED ORDER — METHYLPREDNISOLONE SODIUM SUCC 125 MG IJ SOLR
80.0000 mg | Freq: Once | INTRAMUSCULAR | Status: AC
  Administered 2021-04-24: 80 mg via INTRAVENOUS
  Filled 2021-04-23: qty 2

## 2021-04-23 MED ORDER — ALBUTEROL SULFATE HFA 108 (90 BASE) MCG/ACT IN AERS
2.0000 | INHALATION_SPRAY | Freq: Once | RESPIRATORY_TRACT | Status: AC
  Administered 2021-04-23: 2 via RESPIRATORY_TRACT
  Filled 2021-04-23: qty 6.7

## 2021-04-23 MED ORDER — IPRATROPIUM-ALBUTEROL 0.5-2.5 (3) MG/3ML IN SOLN: 3.0000 mL | Freq: Once | RESPIRATORY_TRACT | Status: DC

## 2021-04-23 MED ORDER — ZOLPIDEM TARTRATE 5 MG PO TABS: 5.0000 mg | ORAL_TABLET | Freq: Every evening | ORAL | Status: DC | PRN

## 2021-04-23 MED ORDER — MAGNESIUM SULFATE 2 GM/50ML IV SOLN
2.0000 g | Freq: Once | INTRAVENOUS | Status: AC
  Administered 2021-04-23: 2 g via INTRAVENOUS
  Filled 2021-04-23: qty 50

## 2021-04-23 MED ORDER — ALPRAZOLAM 0.5 MG PO TABS: 0.5000 mg | ORAL_TABLET | Freq: Three times a day (TID) | ORAL | Status: DC | PRN

## 2021-04-23 MED ORDER — DEXAMETHASONE SODIUM PHOSPHATE 10 MG/ML IJ SOLN
10.0000 mg | Freq: Once | INTRAMUSCULAR | Status: AC
  Administered 2021-04-23: 10 mg via INTRAVENOUS
  Filled 2021-04-23: qty 1

## 2021-04-23 MED ORDER — IPRATROPIUM-ALBUTEROL 0.5-2.5 (3) MG/3ML IN SOLN
6.0000 mL | Freq: Once | RESPIRATORY_TRACT | Status: AC
  Administered 2021-04-23: 6 mL via RESPIRATORY_TRACT
  Filled 2021-04-23: qty 3

## 2021-04-23 MED ORDER — ALBUTEROL SULFATE HFA 108 (90 BASE) MCG/ACT IN AERS: 2.0000 | INHALATION_SPRAY | RESPIRATORY_TRACT | Status: DC | PRN

## 2021-04-23 MED ORDER — SODIUM CHLORIDE 0.9% FLUSH: 3.0000 mL | INTRAVENOUS | Status: DC | PRN

## 2021-04-23 MED ORDER — MONTELUKAST SODIUM 10 MG PO TABS
10.0000 mg | ORAL_TABLET | Freq: Every day | ORAL | Status: DC
  Administered 2021-04-24 (×2): 10 mg via ORAL
  Filled 2021-04-23 (×3): qty 1

## 2021-04-23 MED ORDER — PREDNISONE 20 MG PO TABS
40.0000 mg | ORAL_TABLET | Freq: Two times a day (BID) | ORAL | Status: DC
  Administered 2021-04-24: 40 mg via ORAL
  Filled 2021-04-23: qty 2

## 2021-04-23 NOTE — ED Provider Notes (Signed)
[facility] EMERGENCY DEPARTMENT Provider Note   CSN: [id number] Arrival date & time: 04/23/21  1432     History No chief complaint on file.   Hector Gross is a 20 y.o. male.  20 y.o male with a PMH of Asthma presents to the ED with a chief complaint of shortness of breath since yesterday.  Patient reports he has been increasing his use an inhaler for the past couple of days without any relief.  He does report his asthma worsens during the summertime.  He currently does not have a pulmonologist that follows him for his chronic condition.  In addition he complains of a nonproductive cough, that he has not taken anything for relief.  Reports shortness of breath that occurs with ambulation and while at rest. No alleviating factors.  He denies any fever, chest pain, leg swelling, prior history of blood clots.  The history is provided by the patient and medical records.      Past Medical History:  Diagnosis Date   Asthma     There are no problems to display for this patient.   History reviewed. No pertinent surgical history.     No family history on file.  Social History   Tobacco Use   Smoking status: Never   Smokeless tobacco: Never  Vaping Use   Vaping Use: Never used  Substance Use Topics   Alcohol use: No   Drug use: No    Home Medications Prior to Admission medications   Medication Sig Start Date End Date Taking? Authorizing Provider  montelukast (SINGULAIR) 10 MG tablet Take 1 tablet (10 mg total) by mouth at bedtime. 02/18/21  Yes Pfeiffer, Lebron Conners, MD  PROAIR HFA 108 [phone] Base) MCG/ACT inhaler INHALE 1 TO 2 PUFF INTO THE LUNGS EVERY 6 HOURS AS NEEDED FOR WHEEZING OR SHORTNESS OF BREATH Patient taking differently: Inhale 2 puffs into the lungs See admin instructions. Inhale 2 puffs into the lungs every 30 minutes as needed for shortness of breath or wheezing 02/24/20  Yes Newlin, Enobong, MD  albuterol (VENTOLIN HFA) 108 (90 Base) MCG/ACT inhaler  Inhale 1-2 puffs into the lungs every 4 (four) hours as needed for wheezing or shortness of breath. Patient not taking: No sig reported 02/18/21   Arby Barrette, MD  Budesonide (PULMICORT FLEXHALER) 90 MCG/ACT inhaler Inhale 2 puffs into the lungs 2 (two) times daily. Patient not taking: No sig reported 12/22/18   [name], NP  Budesonide (PULMICORT FLEXHALER) 90 MCG/ACT inhaler Inhale 2 puffs into the lungs 2 (two) times daily. Patient not taking: Reported on 04/23/2021 02/18/21   Arby Barrette, MD  predniSONE (STERAPRED UNI-PAK 21 TAB) 10 MG (21) TBPK tablet Take 6 tabs (60mg ) day 1, 5 tabs (50mg ) day 2, 4 tabs (40mg ) day 3, 3 tabs (30mg ) day 4, 2 tabs (20mg ) day 5, and 1 tab (10mg ) day 6. Patient not taking: No sig reported 03/30/21   C, PA-C    Allergies    Patient has no known allergies.  Review of Systems   Review of Systems  Constitutional:  Negative for chills and fever.  HENT:  Negative for sinus pressure and sore throat.   Respiratory:  Positive for cough and chest tightness.   Cardiovascular:  Negative for chest pain and leg swelling.  Gastrointestinal:  Negative for abdominal pain, nausea and vomiting.  Genitourinary:  Negative for flank pain.  Musculoskeletal:  Negative for back pain.  Skin:  Negative for pallor and wound.  Neurological:  Negative for dizziness and light-headedness.  All other systems reviewed and are negative.  Physical Exam Updated Vital Signs BP (!) 149/90   Pulse (!) 104   Temp 97.8 F (36.6 C) (Oral)   Resp (!) 24   Ht 5\' 11"  (1.803 m)   Wt 99.8 kg   SpO2 90%   BMI 30.68 kg/m   Physical Exam Vitals and nursing note reviewed.  Constitutional:      Appearance: Normal appearance.  HENT:     Head: Normocephalic and atraumatic.     Nose: Nose normal.     Mouth/Throat:     Mouth: Mucous membranes are moist.     Pharynx: Uvula midline. Posterior oropharyngeal erythema present. No oropharyngeal exudate.     Tonsils: No  tonsillar exudate.     Comments: Uvula is midline, oropharynx with mild erythema but no exudate or swelling. Eyes:     Pupils: Pupils are equal, round, and reactive to light.  Cardiovascular:     Rate and Rhythm: Normal rate.     Comments: No bilateral calf tenderness. Pulmonary:     Effort: Pulmonary effort is normal.     Breath sounds: Examination of the right-upper field reveals wheezing. Examination of the right-middle field reveals wheezing. Examination of the right-lower field reveals wheezing. Examination of the left-lower field reveals decreased breath sounds. Decreased breath sounds and wheezing present. No rhonchi.     Comments: Wheezing throughout all lung fields, expiratory wheezing also noted. Abdominal:     General: Abdomen is flat.     Palpations: Abdomen is soft.     Tenderness: There is no abdominal tenderness. There is no right CVA tenderness or left CVA tenderness.  Musculoskeletal:        General: No swelling.     Cervical back: Normal range of motion and neck supple.     Right lower leg: No edema.     Left lower leg: No edema.  Skin:    General: Skin is warm and dry.  Neurological:     Mental Status: He is alert and oriented to person, place, and time.    ED Results / Procedures / Treatments   Labs (all labs ordered are listed, but only abnormal results are displayed) Labs Reviewed  CBC WITH DIFFERENTIAL/PLATELET - Abnormal; Notable for the following components:      Result Value   RBC 5.92 (*)    Platelets 420 (*)    Eosinophils Absolute 1.1 (*)    All other components within normal limits  RESP PANEL BY RT-PCR (FLU A&B, COVID) [id number]  COMPREHENSIVE METABOLIC PANEL  MAGNESIUM  D-DIMER, QUANTITATIVE  I-STAT VENOUS BLOOD GAS, ED    EKG None  Radiology DG Chest 1 View  Result Date: 04/23/2021 CLINICAL DATA:  Shortness of breath for 1 day.  History of asthma. EXAM: CHEST  1 VIEW COMPARISON:  02/18/2021 FINDINGS: Normal sized heart. Clear lungs.  Moderate to marked central peribronchial thickening with progression. Unremarkable bones. IMPRESSION: Moderate to marked central bronchitic changes with progression. Electronically Signed   By: 04/20/2021 M.D.   On: 04/23/2021 16:05    Procedures Procedures   Medications Ordered in ED Medications  ipratropium-albuterol (DUONEB) 0.5-2.5 (3) MG/3ML nebulizer solution 6 mL (6 mLs Nebulization Given 04/23/21 1813)  albuterol (VENTOLIN HFA) 108 (90 Base) MCG/ACT inhaler 2 puff (2 puffs Inhalation Given 04/23/21 1815)  dexamethasone (DECADRON) injection 10 mg (10 mg Intravenous Given 04/23/21 1854)  magnesium sulfate IVPB 2 g 50 mL (0  g Intravenous Stopped 04/23/21 2004)    ED Course  I have reviewed the triage vital signs and the nursing notes.  Pertinent labs & imaging results that were available during my care of the patient were reviewed by me and considered in my medical decision making (see chart for details).  Clinical Course as of 04/23/21 2104  Wynelle Link Apr 23, 2021  9030 Magnesium: 1.8 [JS]  2045 Resp(!): 24 [JS]    Clinical Course User Index [JS] Claude Manges, PA-C   MDM Rules/Calculators/A&P     Patient with a past medical history of asthma presents to the ED with shortness of breath, nonproductive cough for the past 2 days.  Reports increased use on his inhaler, has not been able to obtain any relief with his medications.  Does report worsening symptoms usually occur during the summertime.  He arrived in the ED with oxygen saturation is 94%, no tachypnea present.  Heart rate is slightly elevated at 96.  Exam is remarkable for wheezing throughout all lung fields, endorses chest tightness with coughing.  Oropharynx with mild erythema but no exudates or TIA noted.  No sinus tenderness. No BL leg swelling, or pitting edema.   Due to significant wheezing throughout, will obtain labs along with mag.  Feel the patient would benefit from breathing treatment, magnesium along with a short  course of steroids.  I have extensively reviewed his chart, it does look like he has 1 visit monthly for asthma.  He will also be provided with the [place] [facility] wellness clinic to establish care to follow-up for his ongoing asthma.  Xray of the chest showed: Moderate to marked central bronchitic changes with progression.  Patient received magnesium, lungs continued to be remarkable for wheezing throughout.  According to pharmacy technician, patient has been using his albuterol inhaler every 30 minutes this past couple weeks and throughout the summer.  Reports he never refilled his Pulmicort that was prescribed for him in May.  Oxygen saturations have been 90% at rest, 89% when doing any sort of physical activity.   9:02 PM patient reassessed by me, not much improvement in his symptoms.  He did receive the Decadron.  I did speak to the hospitalist on-call, who will admit patient for further management at this time.  Of note, patient's VBG along with COVID testing has not been obtained yet although order has been placed.   Portions of this note were generated with Scientist, clinical (histocompatibility and immunogenetics). Dictation errors may occur despite best attempts at proofreading.  Final Clinical Impression(s) / ED Diagnoses Final diagnoses:  Uncomplicated asthma, unspecified asthma severity, unspecified whether persistent    Rx / DC Orders ED Discharge Orders     None        Claude Manges, Cordelia Poche 04/23/21 2104    Virgina Norfolk, DO 04/23/21 2318

## 2021-04-23 NOTE — ED Triage Notes (Signed)
Pt reports asthma flare-up since last night.  Using inhaler without relief.

## 2021-04-23 NOTE — ED Notes (Addendum)
This RN at bedside to assess pt respiratory status. Pt noted to be 97-98% on 2L O2. This nurse titrated pt down to room air. Pt maintain 94% while at rest, but rapidly decreased to 89% room air while conversating, Provider made aware of findings.

## 2021-04-23 NOTE — H&P (Signed)
History and Physical    Hector Gross:[id number] DOB: 22-Dec-2000 DOA: 04/23/2021  PCP: [name], NP (Confirm with patient/family/NH records and if not entered, this has to be entered at [facility] point of entry) Patient coming from: home  I have personally briefly reviewed patient's old medical records in [facility]  Chief Complaint: wheezing  HPI: Hector Gross is a 20 y.o. male with medical history significant of asthma with poor adherence to medical regimen. He has had multiple ED visits and rae visits to PCP.He was to been seen by pulmonology but never rescheduled an appointment.   He reports that he has been using his SABA every 30-60 minutes through out the summer. He has been to the ED for refills. Pharmacy records indicate 3 refills of ProAir in 24 days. He presents now for persistent wheezing. .  ED Course: T 97.8  HR 104  RR 24 149/90  O2 sat was 90% at rest dropping into high 80's with movement. He responded well no Rogers oxygen. CXR reveal peribronchial thickening. Lab revealed Mg 1.8 otherwise nl chemistry. CBC with nlWBC but 14% eosinophils. He was administered IV Mag, Decadron 1o mg IV and was given a duoneb treatment. His wheezing was persistent and he remained mildly hypoxemic. [facility] called to admit for continued treatment.   Review of Systems: As per HPI otherwise 10 point review of systems negative.    Past Medical History:  Diagnosis Date   Asthma     History reviewed. No pertinent surgical history.  Soc Hx - HSG. Single. Lives at home with Mother and 3 siblings. 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   reports that he has never smoked. He has never used smokeless tobacco. He reports that he does not drink alcohol and does not use drugs.  No Known Allergies  No family history on file.   Prior to Admission medications   Medication Sig Start Date End Date Taking? Authorizing Provider  montelukast (SINGULAIR) 10 MG tablet Take 1 tablet (10 mg total)  by mouth at bedtime. 02/18/21  Yes Pfeiffer, Lebron Conners, MD  PROAIR HFA 108 [phone] Base) MCG/ACT inhaler INHALE 1 TO 2 PUFF INTO THE LUNGS EVERY 6 HOURS AS NEEDED FOR WHEEZING OR SHORTNESS OF BREATH Patient taking differently: Inhale 2 puffs into the lungs See admin instructions. Inhale 2 puffs into the lungs every 30 minutes as needed for shortness of breath or wheezing 02/24/20  Yes Newlin, Enobong, MD  albuterol (VENTOLIN HFA) 108 (90 Base) MCG/ACT inhaler Inhale 1-2 puffs into the lungs every 4 (four) hours as needed for wheezing or shortness of breath. Patient not taking: No sig reported 02/18/21   Arby Barrette, MD  Budesonide (PULMICORT FLEXHALER) 90 MCG/ACT inhaler Inhale 2 puffs into the lungs 2 (two) times daily. Patient not taking: No sig reported 12/22/18   [name], NP  Budesonide (PULMICORT FLEXHALER) 90 MCG/ACT inhaler Inhale 2 puffs into the lungs 2 (two) times daily. Patient not taking: Reported on 04/23/2021 02/18/21   Arby Barrette, MD  predniSONE (STERAPRED UNI-PAK 21 TAB) 10 MG (21) TBPK tablet Take 6 tabs (60mg ) day 1, 5 tabs (50mg ) day 2, 4 tabs (40mg ) day 3, 3 tabs (30mg ) day 4, 2 tabs (20mg ) day 5, and 1 tab (10mg ) day 6. Patient not taking: No sig reported 03/30/21   , PA-C    Physical Exam: Vitals:   04/23/21 1453 04/23/21 1845 04/23/21 1855  BP: (!) 139/98 (!) 149/90  Pulse: 96 (!) 104   Resp: 15 (!) 24   Temp: 97.8 F (36.6 C)    TempSrc: Oral    SpO2: 94% 90%   Weight:   99.8 kg  Height:   5\' 11"  (1.803 m)     Vitals:   04/23/21 1453 04/23/21 1845 04/23/21 1855  BP: (!) 139/98 (!) 149/90   Pulse: 96 (!) 104   Resp: 15 (!) 24   Temp: 97.8 F (36.6 C)    TempSrc: Oral    SpO2: 94% 90%   Weight:   99.8 kg  Height:   5\' 11"  (1.803 m)   General: WNWD man in no distress Eyes: PERRL, lids and conjunctivae normal ENMT: Mucous membranes are moist. Posterior pharynx clear of any exudate or lesions.Normal dentition.  Neck: normal, supple, no masses,  no thyromegaly Respiratory: Prolonged expiratory phase. Inspiratory and expiratory wheezing, sounds "tight." No accessory muscle use or other signs of increased WOB. Cardiovascular: Regular rate and rhythm, no murmurs / rubs / gallops. No extremity edema. 2+ pedal pulses. No carotid bruits.  Abdomen: no tenderness, no masses palpated. No hepatosplenomegaly. Bowel sounds positive.  Musculoskeletal: no clubbing / cyanosis. No joint deformity upper and lower extremities. Good ROM, no contractures. Normal muscle tone.  Skin: no rashes, lesions, ulcers. No induration Neurologic: CN 2-12 grossly intact. Sensation intact, DTR normal. Strength 5/5 in all 4.  Psychiatric: Normal judgment and insight. Alert and oriented x 3. Normal mood.     Labs on Admission: I have personally reviewed following labs and imaging studies  CBC: Recent Labs  Lab 04/23/21 1747  WBC 8.1  NEUTROABS 4.5  HGB 16.8  HCT 49.5  MCV 83.6  PLT 420*   Basic Metabolic Panel: Recent Labs  Lab 04/23/21 1747  NA 138  K 3.8  CL 104  CO2 25  GLUCOSE 92  BUN 9  CREATININE 1.00  CALCIUM 9.8  MG 1.8   GFR: Estimated Creatinine Clearance: 139.5 mL/min (by C-G formula based on SCr of 1 mg/dL). Liver Function Tests: Recent Labs  Lab 04/23/21 1747  AST 30  ALT 31  ALKPHOS 69  BILITOT 0.8  PROT 7.8  ALBUMIN 4.0   No results for input(s): LIPASE, AMYLASE in the last 168 hours. No results for input(s): AMMONIA in the last 168 hours. Coagulation Profile: No results for input(s): INR, PROTIME in the last 168 hours. Cardiac Enzymes: No results for input(s): CKTOTAL, CKMB, CKMBINDEX, TROPONINI in the last 168 hours. BNP (last 3 results) No results for input(s): PROBNP in the last 8760 hours. HbA1C: No results for input(s): HGBA1C in the last 72 hours. CBG: No results for input(s): GLUCAP in the last 168 hours. Lipid Profile: No results for input(s): CHOL, HDL, LDLCALC, TRIG, CHOLHDL, LDLDIRECT in the last 72  hours. Thyroid Function Tests: No results for input(s): TSH, T4TOTAL, FREET4, T3FREE, THYROIDAB in the last 72 hours. Anemia Panel: No results for input(s): VITAMINB12, FOLATE, FERRITIN, TIBC, IRON, RETICCTPCT in the last 72 hours. Urine analysis: No results found for: COLORURINE, APPEARANCEUR, LABSPEC, PHURINE, GLUCOSEU, HGBUR, BILIRUBINUR, KETONESUR, PROTEINUR, UROBILINOGEN, NITRITE, LEUKOCYTESUR  Radiological Exams on Admission: DG Chest 1 View  Result Date: 04/23/2021 CLINICAL DATA:  Shortness of breath for 1 day.  History of asthma. EXAM: CHEST  1 VIEW COMPARISON:  02/18/2021 FINDINGS: Normal sized heart. Clear lungs. Moderate to marked central peribronchial thickening with progression. Unremarkable bones. IMPRESSION: Moderate to marked central bronchitic changes with progression. Electronically Signed   By: 06/24/2021.D.  On: 04/23/2021 16:05    EKG: Independently reviewed. NSR w/o acute change  Assessment/Plan Active Problems:   Asthma, chronic, unspecified asthma severity, with acute exacerbation   Chronic asthma with acute exacerbation  !. Chronic asthma with acute exacerbation - patient non-adherent to maintenance therapy. Reviewed mechanism of intrinsic asthma, the phenomena of remodeling and the risk of progressively severe asthma episodes with the risk of intubation or death.   Plan Med-surg admit ` Supplemental oxygen to keep O2 sat > 92%  Additional dose IV steroids now  Starting in AM Prednisone 40 mg bid and taper as symptoms recede  Pulmicort 2 inhalations daily  SABA (albuterol) 2 inhalations q 4 prn.  Singulair 10 mg qHS  Close outpatient follow-up    DVT prophylaxis: lovenox  Code Status: full code  Family Communication: spoke with Herbie Drape, mother, explained Dx, disease mechanism and severity, Tx plan. Answered all Questions  Disposition Plan: home when stable - 24-48 hours  Consults called: none  Admission status: obs    Illene Regulus  MD [facility] Pager [phone]  If 7PM-7AM, please contact night-coverage [email or web address] Password [email or web address]  04/23/2021, 9:22 PM

## 2021-04-24 DIAGNOSIS — J45909 Unspecified asthma, uncomplicated: Secondary | ICD-10-CM | POA: Diagnosis not present

## 2021-04-24 LAB — CBC WITH DIFFERENTIAL/PLATELET
Abs Immature Granulocytes: 0.02 10*3/uL (ref 0.00–0.07)
Basophils Absolute: 0 10*3/uL (ref 0.0–0.1)
Basophils Relative: 0 %
Eosinophils Absolute: 0 10*3/uL (ref 0.0–0.5)
Eosinophils Relative: 0 %
HCT: 49.9 % (ref 39.0–52.0)
Hemoglobin: 16.7 g/dL (ref 13.0–17.0)
Immature Granulocytes: 0 %
Lymphocytes Relative: 8 %
Lymphs Abs: 0.6 10*3/uL — ABNORMAL LOW (ref 0.7–4.0)
MCH: 28 pg (ref 26.0–34.0)
MCHC: 33.5 g/dL (ref 30.0–36.0)
MCV: 83.6 fL (ref 80.0–100.0)
Monocytes Absolute: 0 10*3/uL — ABNORMAL LOW (ref 0.1–1.0)
Monocytes Relative: 0 %
Neutro Abs: 6.5 10*3/uL (ref 1.7–7.7)
Neutrophils Relative %: 92 %
Platelets: 453 10*3/uL — ABNORMAL HIGH (ref 150–400)
RBC: 5.97 MIL/uL — ABNORMAL HIGH (ref 4.22–5.81)
RDW: 13 % (ref 11.5–15.5)
WBC: 7.1 10*3/uL (ref 4.0–10.5)
nRBC: 0 % (ref 0.0–0.2)

## 2021-04-24 MED ORDER — IPRATROPIUM-ALBUTEROL 0.5-2.5 (3) MG/3ML IN SOLN: 6.0000 mL | RESPIRATORY_TRACT | Status: DC | PRN

## 2021-04-24 MED ORDER — PREDNISONE 20 MG PO TABS
40.0000 mg | ORAL_TABLET | Freq: Every day | ORAL | Status: DC
  Administered 2021-04-25: 40 mg via ORAL
  Filled 2021-04-24: qty 2

## 2021-04-24 NOTE — Progress Notes (Signed)
PROGRESS NOTE   Hector Gross  WUJ:[id number] DOB: 2001-02-14 DOA: 04/23/2021 PCP: [name], NP  Brief Narrative:  20 year old black male intrinsic asthma-, mild obesity BMI 30 with recent ED visits 02/19/2019 ,03/30/2021 asthma exacerbation run out of albuterol inhaler several times Returned to [facility] ED 04/20/2021 ran out of albuterol once again-left AMA secondary to long wait time-return with SO[facility] DOE increasing rescue inhaler use as above-Rx in ED Decadron albuterol magnesium prednisone montelukast Overall labs unremarkable-flu PCR COVID-negative Admitted to MedSurg given IV steroids  Hospital-Problem based course  Acute asthma exacerb Counseled re: need for f/u--already has appt with Dr. 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  Dispo: The patient is from: Home              Anticipated d/c is to: Home              Patient currently is not medically stable to d/c.   Difficult to place patient No       Consultants:  None yet  Procedures: No  Antimicrobials: No   Subjective: Still wheezy quite short of breath chest is quite congested sounding No sputum no fever no chills no chest pain  Objective: Vitals:   04/24/21 0130 04/24/21 0400 04/24/21 0430 04/24/21 0500  BP: (!) 131/96 (!) 131/97 122/76 123/85  Pulse: (!) 107 (!) 109 (!) 110 (!) 118  Resp: (!) 33 (!) 27 (!) 26 (!) 21  Temp:      TempSrc:      SpO2: 90% 91% 91% 91%  Weight:      Height:         Intake/Output Summary (Last 24 hours) at 04/24/2021 0658 Last data filed at 04/23/2021 2004 Gross per 24 hour  Intake 58.33 ml  Output --  Net 58.33 ml   Filed Weights   04/23/21 1855  Weight: 99.8 kg    Examination: Awake pleasant no distress no icterus no pallor Chest wheezes posterolaterally with decreased air entry on left side Abdomen soft no rebound no guarding ROM intact Lower extremities are soft nontender   Data Reviewed: personally reviewed   CBC    Component Value Date/Time   WBC 7.1 04/24/2021 0355   RBC 5.97 (H) 04/24/2021 0355   HGB 16.7 04/24/2021 0355   HCT 49.9 04/24/2021 0355   PLT 453 (H) 04/24/2021 0355   MCV 83.6 04/24/2021 0355   MCH 28.0 04/24/2021 0355   MCHC 33.5 04/24/2021 0355   RDW 13.0 04/24/2021 0355   LYMPHSABS 0.6 (L) 04/24/2021 0355   MONOABS 0.0 (L) 04/24/2021 0355   EOSABS 0.0 04/24/2021 0355   BASOSABS 0.0 04/24/2021 0355   CMP Latest Ref Rng & Units 04/23/2021 04/23/2021  Glucose 70 - 99 mg/dL - 92  BUN 6 - 20 mg/dL - 9  Creatinine 9.56 - 1.24 mg/dL - 2.13  Sodium 086 - 578 mmol/L 140 138  Potassium 3.5 - 5.1 mmol/L 4.1 3.8  Chloride 98 - 111 mmol/L - 104  CO2 22 - 32 mmol/L - 25  Calcium 8.9 - 10.3 mg/dL -  9.8  Total Protein 6.5 - 8.1 g/dL - 7.8  Total Bilirubin 0.3 - 1.2 mg/dL - 0.8  Alkaline Phos 38 - 126 U/L - 69  AST 15 - 41 U/L - 30  ALT 0 - 44 U/L - 31     Radiology Studies: DG Chest 1 View  Result Date: 04/23/2021 CLINICAL DATA:  Shortness of breath for 1 day.  History of asthma. EXAM: CHEST  1 VIEW COMPARISON:  02/18/2021 FINDINGS: Normal sized heart. Clear lungs. Moderate to marked central peribronchial thickening with progression. Unremarkable bones. IMPRESSION: Moderate to marked central bronchitic changes with progression. Electronically Signed   By: Beckie Salts M.D.   On: 04/23/2021 16:05     Scheduled Meds:  budesonide  0.25 mg Inhalation BID   enoxaparin (LOVENOX) injection  50 mg Subcutaneous Q24H    montelukast  10 mg Oral QHS   predniSONE  40 mg Oral BID WC   sodium chloride flush  3 mL Intravenous Q12H   Continuous Infusions:  sodium chloride       LOS: 0 days   Time spent: 18  Rhetta Mura, MD [facility] To contact the attending provider between 7A-7P or the covering provider during after hours 7P-7A, please log into the web site [email or web address] and access using universal Green password for that web site. If you do not have the password, please call the hospital operator.  04/24/2021, 6:58 AM

## 2021-04-24 NOTE — Plan of Care (Signed)
  Problem: Education: Goal: Knowledge of General Education information will improve Description Including pain rating scale, medication(s)/side effects and non-pharmacologic comfort measures Outcome: Progressing   Problem: Health Behavior/Discharge Planning: Goal: Ability to manage health-related needs will improve Outcome: Progressing   

## 2021-04-25 ENCOUNTER — Encounter: Payer: Self-pay | Admitting: Family Medicine

## 2021-04-25 DIAGNOSIS — J45909 Unspecified asthma, uncomplicated: Secondary | ICD-10-CM | POA: Diagnosis not present

## 2021-04-25 LAB — COMPREHENSIVE METABOLIC PANEL
ALT: 29 U/L (ref 0–44)
AST: 27 U/L (ref 15–41)
Albumin: 3.8 g/dL (ref 3.5–5.0)
Alkaline Phosphatase: 60 U/L (ref 38–126)
Anion gap: 7 (ref 5–15)
BUN: 18 mg/dL (ref 6–20)
CO2: 27 mmol/L (ref 22–32)
Calcium: 9.6 mg/dL (ref 8.9–10.3)
Chloride: 104 mmol/L (ref 98–111)
Creatinine, Ser: 1.06 mg/dL (ref 0.61–1.24)
GFR, Estimated: >60 mL/min (ref >60)
Glucose, Bld: 93 mg/dL (ref 70–99)
Potassium: 3.9 mmol/L (ref 3.5–5.1)
Sodium: 138 mmol/L (ref 135–145)
Total Bilirubin: 1.1 mg/dL (ref 0.3–1.2)
Total Protein: 6.9 g/dL (ref 6.5–8.1)

## 2021-04-25 MED ORDER — PULMICORT FLEXHALER 90 MCG/ACT IN AEPB: 2.0000 | INHALATION_SPRAY | Freq: Two times a day (BID) | RESPIRATORY_TRACT | 1 refills | Status: DC

## 2021-04-25 MED ORDER — ALBUTEROL SULFATE HFA 108 (90 BASE) MCG/ACT IN AERS
1.0000 | INHALATION_SPRAY | RESPIRATORY_TRACT | Status: DC | PRN
  Filled 2021-04-25: qty 6.7

## 2021-04-25 MED ORDER — ALBUTEROL SULFATE HFA 108 (90 BASE) MCG/ACT IN AERS: 2.0000 | INHALATION_SPRAY | Freq: Four times a day (QID) | RESPIRATORY_TRACT | 2 refills | Status: DC | PRN

## 2021-04-25 MED ORDER — AEROCHAMBER PLUS FLO-VU MEDIUM MISC: 1.0000 | Freq: Once | 0 refills | Status: AC

## 2021-04-25 MED ORDER — ALBUTEROL SULFATE HFA 108 (90 BASE) MCG/ACT IN AERS: 1.0000 | INHALATION_SPRAY | RESPIRATORY_TRACT | 1 refills | Status: DC | PRN

## 2021-04-25 MED ORDER — ALBUTEROL SULFATE (2.5 MG/3ML) 0.083% IN NEBU
2.5000 mg | INHALATION_SOLUTION | Freq: Four times a day (QID) | RESPIRATORY_TRACT | Status: DC
  Administered 2021-04-25: 2.5 mg via RESPIRATORY_TRACT
  Filled 2021-04-25: qty 3

## 2021-04-25 MED ORDER — AEROCHAMBER PLUS FLO-VU MEDIUM MISC
1.0000 | Freq: Once | Status: DC
  Filled 2021-04-25 (×3): qty 1

## 2021-04-25 MED ORDER — PREDNISONE 20 MG PO TABS: ORAL_TABLET | ORAL | 0 refills | Status: AC

## 2021-04-25 MED ORDER — MONTELUKAST SODIUM 10 MG PO TABS: 10.0000 mg | ORAL_TABLET | Freq: Every day | ORAL | 3 refills | Status: DC

## 2021-04-25 NOTE — Progress Notes (Signed)
Discharge teaching complete. Meds, diet, activity, follow up appointments reviewed and all questions answered. Copy of instructions given to patient and prescriptions sent to pharmacy.  

## 2021-04-25 NOTE — Progress Notes (Signed)
Ambulated patient in hall without O2. Heart rate stayed in the 130's and O2 level stayed between 88%-91%. Patient says he feels perfectly fine and is breathing well. Dr. Mahala Menghini made aware.

## 2021-04-25 NOTE — Progress Notes (Signed)
Pt educated on the use of a peak flow. RRT also gave some general education about managing pt's asthma with teach back.

## 2021-04-25 NOTE — Discharge Summary (Signed)
Physician Discharge Summary  Hector Gross TIR:[id number] DOB: Jun 11, 2001 DOA: 04/23/2021  PCP: Hector Rigg, NP  Admit date: 04/23/2021 Discharge date: 04/25/2021  Time spent: 36 minutes  Recommendations for Outpatient Follow-up:  Albuterol, spacer, peak flow meter, significant education provided this admission-please follow-up for adherence etc. in the outpatient setting Prednisone taper as per [facility] Work note given on discharge given active nature of his work  Discharge Diagnoses:  MAIN problem for hospitalization   Acute asthma exacerbation without status asthmaticus  Please see below for itemized issues addressed in [name]- refer to other progress notes for clarity if needed  Discharge Condition: Improved  Diet recommendation: Heart healthy  Filed Weights   04/23/21 1855  Weight: 99.8 kg    History of present illness:  20 year old black male intrinsic asthma-, mild obesity BMI 30 with recent ED visits 02/19/2019 ,03/30/2021 asthma exacerbation run out of albuterol inhaler several times Returned to [facility] ED 04/20/2021 ran out of albuterol once again-left AMA secondary to long wait time-return with SOB DOE increasing rescue inhaler use as above-Rx in ED Decadron albuterol magnesium prednisone montelukast Overall labs unremarkable-flu PCR COVID-negative Admitted to MedSurg given IV steroids  Hospital Course:  Acute asthma exacerb Counseled re: need for f/u--already has appt with Dr. 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 [employer] so  has enough activity   Discharge Exam: Vitals:   04/25/21 1308 04/25/21 1701  BP:  127/81  Pulse: 68 72  Resp: 18 19  Temp:  97.9 F (36.6 C)  SpO2:  96%    Subj on day of d/c Feels fair He had a paroxysm of coughing and for some reason was placed on oxygen-he did not desat Was able to walk in the hallway relatively comfortably  General Exam on discharge  Awake coherent no distress EOMI NCAT Mild wheeze air entry slightly better Ambulatory in hallway S1-S2 no murmur no rub no gallop Abdomen soft  Discharge Instructions   Discharge Instructions     Diet - low sodium heart healthy   Complete by: As directed    Discharge instructions   Complete by: As directed    Make sure that you take your inhaler as directed-you will need to use the AeroChamber as you have been taught to help with the albuterol to get into your lungs properly Please obtain the peak flow meter from your pharmacy-this will help you get an idea of when you are in the danger zone and when to come to the emergency room We will give you a work excuse but you will definitely need follow-up with the doctor who will be seeing you for allergies and testing etc. etc. You will notice that you are on steroids and different doses-please follow the instructions on the label and complete the steroids to ensure that you improve properly   Increase activity slowly   Complete by: As directed    Peak flow meter   Complete by: As directed       Allergies as of 04/25/2021   No Known Allergies      Medication List  STOP taking these medications    predniSONE 10 MG (21) Tbpk tablet Commonly known as: STERAPRED UNI-PAK 21 TAB Replaced by: predniSONE 20 MG tablet       TAKE these medications    AeroChamber Plus Flo-Vu Medium Misc 1 each by Other route once for 1 dose.   albuterol 108 (90 Base) MCG/ACT inhaler Commonly known as: VENTOLIN HFA Inhale 2 puffs into the lungs every 6 (six) hours as needed for  wheezing or shortness of breath. What changed: See the new instructions.   albuterol 108 (90 Base) MCG/ACT inhaler Commonly known as: VENTOLIN HFA Inhale 1-2 puffs into the lungs every 4 (four) hours as needed for wheezing or shortness of breath. What changed: Another medication with the same name was changed. Make sure you understand how and when to take each.   montelukast 10 MG tablet Commonly known as: Singulair Take 1 tablet (10 mg total) by mouth at bedtime.   predniSONE 20 MG tablet Commonly known as: DELTASONE Take 2 tablets (40 mg total) by mouth daily with breakfast for 7 days, THEN 1 tablet (20 mg total) daily with breakfast for 7 days. Start taking on: April 26, 2021 Replaces: predniSONE 10 MG (21) Tbpk tablet   Pulmicort Flexhaler 90 MCG/ACT inhaler Generic drug: Budesonide Inhale 2 puffs into the lungs 2 (two) times daily. What changed: Another medication with the same name was removed. Continue taking this medication, and follow the directions you see here.       No Known Allergies    The results of significant diagnostics from this hospitalization (including imaging, microbiology, ancillary and laboratory) are listed below for reference.    Significant Diagnostic Studies: DG Chest 1 View  Result Date: 04/23/2021 CLINICAL DATA:  Shortness of breath for 1 day.  History of asthma. EXAM: CHEST  1 VIEW COMPARISON:  02/18/2021 FINDINGS: Normal sized heart. Clear lungs. Moderate to marked central peribronchial thickening with progression. Unremarkable bones. IMPRESSION: Moderate to marked central bronchitic changes with progression. Electronically Signed   By: Beckie Salts M.D.   On: 04/23/2021 16:05    Microbiology: Recent Results (from the past 240 hour(s))  Resp Panel by RT-PCR (Flu A&B, Covid) Nasopharyngeal Swab     Status: None   Collection Time: 04/23/21  9:30 PM   Specimen: Nasopharyngeal Swab; Nasopharyngeal(NP) swabs in vial transport medium  Result Value  Ref Range Status   SARS Coronavirus 2 by RT PCR NEGATIVE NEGATIVE Final    Comment: (NOTE) SARS-CoV-2 target nucleic acids are NOT DETECTED.  The SARS-CoV-2 RNA is generally detectable in upper respiratory specimens during the acute phase of infection. The lowest concentration of SARS-CoV-2 viral copies this assay can detect is 138 copies/mL. A negative result does not preclude SARS-Cov-2 infection and should not be used as the sole basis for treatment or other patient management decisions. A negative result may occur with  improper specimen collection/handling, submission of specimen other than nasopharyngeal swab, presence of viral mutation(s) within the areas targeted by this assay, and inadequate number of viral copies(<138 copies/mL). A negative result must be combined with clinical observations, patient history, and epidemiological information. The expected result is Negative.  Fact Sheet for Patients:  BloggerCourse.com  Fact Sheet for Healthcare Providers:  SeriousBroker.it  This test is no t yet approved or cleared by the [place] FDA and  has been authorized for detection and/or diagnosis of SARS-CoV-2 by FDA under an Emergency Use Authorization (EUA). This EUA will remain  in effect (meaning this test  can be used) for the duration of the COVID-19 declaration under Section 564(b)(1) of the Act, 21 U.S.C.section 360bbb-3(b)(1), unless the authorization is terminated  or revoked sooner.       Influenza A by PCR NEGATIVE NEGATIVE Final   Influenza B by PCR NEGATIVE NEGATIVE Final    Comment: (NOTE) The Xpert Xpress SARS-CoV-2/FLU/RSV plus assay is intended as an aid in the diagnosis of influenza from Nasopharyngeal swab specimens and should not be used as a sole basis for treatment. Nasal washings and aspirates are unacceptable for Xpert Xpress SARS-CoV-2/FLU/RSV testing.  Fact Sheet for  Patients: BloggerCourse.com  Fact Sheet for Healthcare Providers: SeriousBroker.it  This test is not yet approved or cleared by the [place] FDA and has been authorized for detection and/or diagnosis of SARS-CoV-2 by FDA under an Emergency Use Authorization (EUA). This EUA will remain in effect (meaning this test can be used) for the duration of the COVID-19 declaration under Section 564(b)(1) of the Act, 21 U.S.C. section 360bbb-3(b)(1), unless the authorization is terminated or revoked.  Performed at [facility], [address] [place], [place] [address]      Labs: Basic Metabolic Panel: Recent Labs  Lab 04/23/21 1747 04/23/21 2251 04/25/21 0415  NA 138 140 138  K 3.8 4.1 3.9  CL 104  --  104  CO2 25  --  27  GLUCOSE 92  --  93  BUN 9  --  18  CREATININE 1.00  --  1.06  CALCIUM 9.8  --  9.6  MG 1.8  --   --    Liver Function Tests: Recent Labs  Lab 04/23/21 1747 04/25/21 0415  AST 30 27  ALT 31 29  ALKPHOS 69 60  BILITOT 0.8 1.1  PROT 7.8 6.9  ALBUMIN 4.0 3.8   No results for input(s): LIPASE, AMYLASE in the last 168 hours. No results for input(s): AMMONIA in the last 168 hours. CBC: Recent Labs  Lab 04/23/21 1747 04/23/21 2251 04/24/21 0355  WBC 8.1  --  7.1  NEUTROABS 4.5  --  6.5  HGB 16.8 17.3* 16.7  HCT 49.5 51.0 49.9  MCV 83.6  --  83.6  PLT 420*  --  453*   Cardiac Enzymes: No results for input(s): CKTOTAL, CKMB, CKMBINDEX, TROPONINI in the last 168 hours. BNP: BNP (last 3 results) No results for input(s): BNP in the last 8760 hours.  ProBNP (last 3 results) No results for input(s): PROBNP in the last 8760 hours.  CBG: No results for input(s): GLUCAP in the last 168 hours.     Signed:  Rhetta Mura MD   [facility] 04/25/2021, 5:17 PM

## 2021-04-26 ENCOUNTER — Telehealth: Payer: Self-pay

## 2021-04-26 NOTE — Telephone Encounter (Signed)
Transition Care Management Unsuccessful Follow-up Telephone Call  Date of discharge and from where:  04/25/2021- [facility]  Attempts:  1st Attempt  Reason for unsuccessful TCM follow-up call:  Unable to reach patient

## 2021-04-26 NOTE — Telephone Encounter (Signed)
Transition Care Management Unsuccessful Follow-up Telephone Call  Date of discharge and from where:  04/25/2021, [facility]   Attempts:  1st Attempt  Reason for unsuccessful TCM follow-up call:  Unable to leave message - # [phone], phone rings fast busy.   Has appointment with Bertram Denver, NP @ [facility] - 06/07/2021.   Can discuss scheduling an appointment to be seen sooner,

## 2021-04-27 ENCOUNTER — Telehealth: Payer: Self-pay

## 2021-04-27 NOTE — Telephone Encounter (Signed)
Transition Care Management Unsuccessful Follow-up Telephone Call  Date of discharge and from where:  04/25/2021-[facility]   Attempts:  2nd Attempt  Reason for unsuccessful TCM follow-up call:  Unable to reach patient- patient has appointment with Bertram Denver, NP 06/07/2021 at 3:50 PM.

## 2021-04-27 NOTE — Telephone Encounter (Signed)
Transition Care Management Unsuccessful Follow-up Telephone Call  Date of discharge and from where:  04/25/2021, [facility]  Attempts:  2nd Attempt  Reason for unsuccessful TCM follow-up call:  Unable to reach patient- call placed to # [phone] , the phone rings fast busy.   Patient has appointment with Bertram Denver, NP -  06/07/2021

## 2021-04-28 ENCOUNTER — Telehealth: Payer: Self-pay

## 2021-04-28 NOTE — Telephone Encounter (Signed)
Transition Care Management Unsuccessful Follow-up Telephone Call   Date of discharge and from where:  04/25/2021, [facility]   Attempts:  3rd Attempt   Reason for unsuccessful TCM follow-up call:  Unable to reach patient- call placed to # [phone] , the phone does not connect.   Patient has appointment with Bertram Denver, NP -  06/07/2021

## 2021-06-04 ENCOUNTER — Emergency Department (HOSPITAL_COMMUNITY): Payer: Medicaid Other

## 2021-06-04 ENCOUNTER — Emergency Department (HOSPITAL_COMMUNITY)
Admission: EM | Admit: 2021-06-04 | Discharge: 2021-06-04 | Disposition: A | Discharge status: Home / Self Care | Payer: Medicaid Other | Attending: Emergency Medicine | Admitting: Emergency Medicine

## 2021-06-04 DIAGNOSIS — R0902 Hypoxemia: Secondary | ICD-10-CM | POA: Diagnosis not present

## 2021-06-04 DIAGNOSIS — J45901 Unspecified asthma with (acute) exacerbation: Secondary | ICD-10-CM | POA: Insufficient documentation

## 2021-06-04 DIAGNOSIS — R55 Syncope and collapse: Secondary | ICD-10-CM | POA: Diagnosis not present

## 2021-06-04 DIAGNOSIS — R42 Dizziness and giddiness: Secondary | ICD-10-CM | POA: Diagnosis not present

## 2021-06-04 DIAGNOSIS — Z79899 Other long term (current) drug therapy: Secondary | ICD-10-CM | POA: Insufficient documentation

## 2021-06-04 DIAGNOSIS — R062 Wheezing: Secondary | ICD-10-CM | POA: Diagnosis present

## 2021-06-04 DIAGNOSIS — Z20822 Contact with and (suspected) exposure to covid-19: Secondary | ICD-10-CM | POA: Diagnosis not present

## 2021-06-04 DIAGNOSIS — R059 Cough, unspecified: Secondary | ICD-10-CM | POA: Diagnosis not present

## 2021-06-04 DIAGNOSIS — I959 Hypotension, unspecified: Secondary | ICD-10-CM | POA: Diagnosis not present

## 2021-06-04 LAB — CBC WITH DIFFERENTIAL/PLATELET
Abs Immature Granulocytes: 0.02 10*3/uL (ref 0.00–0.07)
Basophils Absolute: 0.1 10*3/uL (ref 0.0–0.1)
Basophils Relative: 1 %
Eosinophils Absolute: 0.7 10*3/uL — ABNORMAL HIGH (ref 0.0–0.5)
Eosinophils Relative: 7 %
HCT: 42.8 % (ref 39.0–52.0)
Hemoglobin: 14.3 g/dL (ref 13.0–17.0)
Immature Granulocytes: 0 %
Lymphocytes Relative: 10 %
Lymphs Abs: 1 10*3/uL (ref 0.7–4.0)
MCH: 28.4 pg (ref 26.0–34.0)
MCHC: 33.4 g/dL (ref 30.0–36.0)
MCV: 85.1 fL (ref 80.0–100.0)
Monocytes Absolute: 0.7 10*3/uL (ref 0.1–1.0)
Monocytes Relative: 7 %
Neutro Abs: 7.5 10*3/uL (ref 1.7–7.7)
Neutrophils Relative %: 75 %
Platelets: 354 10*3/uL (ref 150–400)
RBC: 5.03 MIL/uL (ref 4.22–5.81)
RDW: 13.2 % (ref 11.5–15.5)
WBC: 9.9 10*3/uL (ref 4.0–10.5)
nRBC: 0 % (ref 0.0–0.2)

## 2021-06-04 LAB — BASIC METABOLIC PANEL
Anion gap: 5 (ref 5–15)
BUN: 10 mg/dL (ref 6–20)
CO2: 23 mmol/L (ref 22–32)
Calcium: 8 mg/dL — ABNORMAL LOW (ref 8.9–10.3)
Chloride: 112 mmol/L — ABNORMAL HIGH (ref 98–111)
Creatinine, Ser: 0.97 mg/dL (ref 0.61–1.24)
GFR, Estimated: >60 mL/min (ref >60)
Glucose, Bld: 68 mg/dL — ABNORMAL LOW (ref 70–99)
Potassium: 3.3 mmol/L — ABNORMAL LOW (ref 3.5–5.1)
Sodium: 140 mmol/L (ref 135–145)

## 2021-06-04 LAB — RESP PANEL BY RT-PCR (FLU A&B, COVID) ARPGX2
Influenza A by PCR: NEGATIVE
Influenza B by PCR: NEGATIVE
SARS Coronavirus 2 by RT PCR: NEGATIVE

## 2021-06-04 MED ORDER — IPRATROPIUM-ALBUTEROL 0.5-2.5 (3) MG/3ML IN SOLN
3.0000 mL | Freq: Once | RESPIRATORY_TRACT | Status: AC
  Administered 2021-06-04: 3 mL via RESPIRATORY_TRACT
  Filled 2021-06-04: qty 3

## 2021-06-04 MED ORDER — PREDNISONE 10 MG PO TABS: 20.0000 mg | ORAL_TABLET | Freq: Every day | ORAL | 0 refills | Status: DC

## 2021-06-04 MED ORDER — SODIUM CHLORIDE 0.9 % IV BOLUS
1000.0000 mL | Freq: Once | INTRAVENOUS | Status: AC
  Administered 2021-06-04: 1000 mL via INTRAVENOUS

## 2021-06-04 NOTE — ED Provider Notes (Signed)
[facility] EMERGENCY DEPARTMENT Provider Note   CSN: [id number] Arrival date & time: 06/04/21  1945     History Chief Complaint  Patient presents with   Hypotension    Hector Gross is a 20 y.o. male.  20 year old male with prior medical history as detailed below presents for evaluation.  Patient with longstanding history of asthma.  Patient reports increased symptoms of asthma over the last several days.  Patient reports having a lightheaded episode earlier this evening.  He did not pass out.  He denies chest pain.  He reports increased wheezing.  He denies fever.  The history is provided by the patient.  Illness Location:  Lightheaded, increased wheezing Severity:  Mild Onset quality:  Gradual Duration:  2 days Timing:  Intermittent Progression:  Waxing and waning Chronicity:  Recurrent     Past Medical History:  Diagnosis Date   Asthma     Patient Active Problem List   Diagnosis Date Noted   Uncomplicated asthma    Asthma, chronic, unspecified asthma severity, with acute exacerbation 04/23/2021   Chronic asthma with acute exacerbation 04/23/2021    No past surgical history on file.     No family history on file.  Social History   Tobacco Use   Smoking status: Never   Smokeless tobacco: Never  Vaping Use   Vaping Use: Never used  Substance Use Topics   Alcohol use: No   Drug use: No    Home Medications Prior to Admission medications   Medication Sig Start Date End Date Taking? Authorizing Provider  predniSONE (DELTASONE) 10 MG tablet Take 2 tablets (20 mg total) by mouth daily. 06/04/21  Yes Wynetta Fines, MD  albuterol (VENTOLIN HFA) 108 (90 Base) MCG/ACT inhaler Inhale 2 puffs into the lungs every 6 (six) hours as needed for wheezing or shortness of breath. 04/25/21   Rhetta Mura, MD  albuterol (VENTOLIN HFA) 108 (90 Base) MCG/ACT inhaler Inhale 1-2 puffs into the lungs every 4 (four) hours as needed for wheezing or  shortness of breath. 04/25/21   Rhetta Mura, MD  Budesonide (PULMICORT FLEXHALER) 90 MCG/ACT inhaler Inhale 2 puffs into the lungs 2 (two) times daily. 04/25/21   Rhetta Mura, MD  montelukast (SINGULAIR) 10 MG tablet Take 1 tablet (10 mg total) by mouth at bedtime. 04/25/21   Rhetta Mura, MD    Allergies    Patient has no known allergies.  Review of Systems   Review of Systems  All other systems reviewed and are negative.  Physical Exam Updated Vital Signs BP 119/77   Pulse 89   Temp 97.7 F (36.5 C) (Oral)   Resp 19   SpO2 93%   Physical Exam Vitals and nursing note reviewed.  Constitutional:      General: He is not in acute distress.    Appearance: Normal appearance. He is well-developed.  HENT:     Head: Normocephalic and atraumatic.  Eyes:     Conjunctiva/sclera: Conjunctivae normal.     Pupils: Pupils are equal, round, and reactive to light.  Cardiovascular:     Rate and Rhythm: Normal rate and regular rhythm.     Heart sounds: Normal heart sounds.  Pulmonary:     Effort: Pulmonary effort is normal. No respiratory distress.     Comments: Trace bilateral expiratory wheezes in all lung fields Abdominal:     General: There is no distension.     Palpations: Abdomen is soft.     Tenderness: There  is no abdominal tenderness.  Musculoskeletal:        General: No deformity. Normal range of motion.     Cervical back: Normal range of motion and neck supple.  Skin:    General: Skin is warm and dry.  Neurological:     General: No focal deficit present.     Mental Status: He is alert and oriented to person, place, and time.    ED Results / Procedures / Treatments   Labs (all labs ordered are listed, but only abnormal results are displayed) Labs Reviewed  BASIC METABOLIC PANEL - Abnormal; Notable for the following components:      Result Value   Potassium 3.3 (*)    Chloride 112 (*)    Glucose, Bld 68 (*)    Calcium 8.0 (*)    All other  components within normal limits  CBC WITH DIFFERENTIAL/PLATELET - Abnormal; Notable for the following components:   Eosinophils Absolute 0.7 (*)    All other components within normal limits  RESP PANEL BY RT-PCR (FLU A&B, COVID) [id number]    EKG None  Radiology DG Chest 2 View  Result Date: 06/04/2021 CLINICAL DATA:  Cough EXAM: CHEST - 2 VIEW COMPARISON:  04/23/2021 FINDINGS: The heart size and mediastinal contours are within normal limits. Both lungs are clear. The visualized skeletal structures are unremarkable. IMPRESSION: Normal study. Electronically Signed   By: Charlett Nose M.D.   On: 06/04/2021 20:39    Procedures Procedures   Medications Ordered in ED Medications  sodium chloride 0.9 % bolus 1,000 mL (0 mLs Intravenous Stopped 06/04/21 2217)  ipratropium-albuterol (DUONEB) 0.5-2.5 (3) MG/3ML nebulizer solution 3 mL (3 mLs Nebulization Given 06/04/21 2216)    ED Course  I have reviewed the triage vital signs and the nursing notes.  Pertinent labs & imaging results that were available during my care of the patient were reviewed by me and considered in my medical decision making (see chart for details).    MDM Rules/Calculators/A&P                           MDM  MSE complete  Hector Gross was evaluated in Emergency Department on 06/04/2021 for the symptoms described in the history of present illness. He was evaluated in the context of the global COVID-19 pandemic, which necessitated consideration that the patient might be at risk for infection with the SARS-CoV-2 virus that causes COVID-19. Institutional protocols and algorithms that pertain to the evaluation of patients at risk for COVID-19 are in a state of rapid change based on information released by regulatory bodies including the CDC and federal and state organizations. These policies and algorithms were followed during the patient's care in the ED.  Patient presents with complaining of feeling lightheaded.  He also  is complaining of increased symptoms related to his history of asthma.  Work-up today is without evidence of significant acute pathology.  Patient is improved after ED evaluation.  He requests prednisone burst.  Importance of close follow-up is stressed.  Strict return precautions given and understood.   Final Clinical Impression(s) / ED Diagnoses Final diagnoses:  Mild asthma with exacerbation, unspecified whether persistent    Rx / DC Orders ED Discharge Orders          Ordered    predniSONE (DELTASONE) 10 MG tablet  Daily        06/04/21 2315  Wynetta Fines, MD 06/04/21 [phone]

## 2021-06-04 NOTE — Discharge Instructions (Addendum)
Return for any problem.  ?

## 2021-06-04 NOTE — ED Triage Notes (Signed)
Pt BIB [facility] after near syncopal episode while washing dishes. EMS reports pt did not lose consciousness but was positive for orthostatic changes. Had eaten 30 mins prior to episode, previously on prednisone taper but completed 1 week ago. Pt currently A&Ox4, denies pain, endorses tightness in chest consistent with hx of asthma.

## 2021-06-05 ENCOUNTER — Telehealth: Payer: Self-pay

## 2021-06-05 NOTE — Telephone Encounter (Signed)
Transition Care Management Unsuccessful Follow-up Telephone Call  Date of discharge and from where:  06/04/2021-[place]   Attempts:  1st Attempt  Reason for unsuccessful TCM follow-up call:  Unable to reach patient

## 2021-06-06 NOTE — Telephone Encounter (Signed)
Transition Care Management Unsuccessful Follow-up Telephone Call  Date of discharge and from where:  06/04/2021 from [facility]  Attempts:  2nd Attempt  Reason for unsuccessful TCM follow-up call:  Unable to leave message

## 2021-06-07 ENCOUNTER — Encounter: Payer: Self-pay | Admitting: Nurse Practitioner

## 2021-06-07 ENCOUNTER — Other Ambulatory Visit: Payer: Self-pay

## 2021-06-07 ENCOUNTER — Ambulatory Visit: Payer: Medicaid Other | Attending: Nurse Practitioner | Admitting: Nurse Practitioner

## 2021-06-07 DIAGNOSIS — J4541 Moderate persistent asthma with (acute) exacerbation: Secondary | ICD-10-CM | POA: Diagnosis not present

## 2021-06-07 MED ORDER — MONTELUKAST SODIUM 10 MG PO TABS: 10.0000 mg | ORAL_TABLET | Freq: Every day | ORAL | 1 refills | Status: DC

## 2021-06-07 MED ORDER — NEBULIZER DEVI: 1.0000 | Freq: Every day | 99 refills | Status: DC | PRN

## 2021-06-07 MED ORDER — FLUTICASONE-SALMETEROL 115-21 MCG/ACT IN AERO: 2.0000 | INHALATION_SPRAY | Freq: Two times a day (BID) | RESPIRATORY_TRACT | 12 refills | Status: DC

## 2021-06-07 MED ORDER — ALBUTEROL SULFATE HFA 108 (90 BASE) MCG/ACT IN AERS: 2.0000 | INHALATION_SPRAY | Freq: Four times a day (QID) | RESPIRATORY_TRACT | 2 refills | Status: DC | PRN

## 2021-06-07 MED ORDER — NEBULIZER MASK ADULT MISC: 1.0000 | Freq: Every day | 99 refills | Status: DC | PRN

## 2021-06-07 MED ORDER — ALBUTEROL SULFATE (2.5 MG/3ML) 0.083% IN NEBU: 2.5000 mg | INHALATION_SOLUTION | Freq: Four times a day (QID) | RESPIRATORY_TRACT | 1 refills | Status: DC | PRN

## 2021-06-07 NOTE — Telephone Encounter (Signed)
Transition Care Management Unsuccessful Follow-up Telephone Call  Date of discharge and from where:  06/04/2021-[place]   Attempts:  3rd Attempt  Reason for unsuccessful TCM follow-up call:  Unable to leave message

## 2021-06-07 NOTE — Progress Notes (Signed)
Virtual Visit via Telephone Note Due to national recommendations of social distancing due to COVID 19, telehealth visit is felt to be most appropriate for this patient at this time.  I discussed the limitations, risks, security and privacy concerns of performing an evaluation and management service by telephone and the availability of in person appointments. I also discussed with the patient that there may be a patient responsible charge related to this service. The patient expressed understanding and agreed to proceed.    I connected with Hector Gross on 06/07/21  at   3:50 PM EDT  EDT by telephone and verified that I am speaking with the correct person using two identifiers.  Location of Patient: Private Residence   [facility] of Provider: [facility]    Persons participating in Telemedicine visit: Bertram Denver FNP-BC Hector Gross    History of Present Illness: Telemedicine visit for: Re establish care He has a past medical history of Asthma.   Asthma Patient's symptoms include dyspnea and non-productive cough. Associated symptoms include  NONE . Symptoms have been gradually worsening since their onset. Medications used in the past to treat these symptoms include beta agonist inhalers, oral steroids, inhaled steroids, combination beta agonists/steroid inhalers, and leukotriene antagonists. Suspected precipitants include  heat . Patient has required Emergency Room treatment for these symptoms, and has required hospitalization. The patient has not been intubated in the past. He has had  4 Ed visits over the past 4 months and 1 hospital admission as well.  He states his insurance would not cover pulmicort flexhaler so he never picked it up after he was discharged from the hospital last month. He is currently taking prednisone and singulair and albuterol. I have sent advair today due overuse of albuterol.    Past Medical History:  Diagnosis Date    Asthma     History reviewed. No pertinent surgical history.  History reviewed. No pertinent family history.  Social History   Socioeconomic History   Marital status: Single    Spouse name: Not on file   Number of children: Not on file   Years of education: Not on file   Highest education level: Not on file  Occupational History   Not on file  Tobacco Use   Smoking status: Never   Smokeless tobacco: Never  Vaping Use   Vaping Use: Never used  Substance and Sexual Activity   Alcohol use: No   Drug use: No   Sexual activity: Not Currently  Other Topics Concern   Not on file  Social History Narrative   Not on file   Social Determinants of Health   Financial Resource Strain: Not on file  Food Insecurity: Not on file  Transportation Needs: Not on file  Physical Activity: Not on file  Stress: Not on file  Social Connections: Not on file     Observations/Objective: Awake, alert and oriented x 3   Review of Systems  Constitutional:  Negative for fever, malaise/fatigue and weight loss.  HENT: Negative.  Negative for nosebleeds.   Eyes: Negative.  Negative for blurred vision, double vision and photophobia.  Respiratory:  Positive for cough. Negative for shortness of breath and wheezing.   Cardiovascular: Negative.  Negative for chest pain, palpitations and leg swelling.  Gastrointestinal: Negative.  Negative for heartburn, nausea and vomiting.  Musculoskeletal: Negative.  Negative for myalgias.  Neurological: Negative.  Negative for dizziness, focal weakness, seizures and headaches.  Psychiatric/Behavioral: Negative.  Negative for suicidal ideas.  Assessment and Plan: Diagnoses and all orders for this visit:  Moderate persistent chronic asthma with acute exacerbation -     Ambulatory referral to Pulmonology -     For home use only DME Nebulizer machine  Other orders -     fluticasone-salmeterol (ADVAIR HFA) 115-21 MCG/ACT inhaler; Inhale 2 puffs into the lungs 2  (two) times daily. -     albuterol (VENTOLIN HFA) 108 (90 Base) MCG/ACT inhaler; Inhale 2 puffs into the lungs every 6 (six) hours as needed for wheezing or shortness of breath. -     montelukast (SINGULAIR) 10 MG tablet; Take 1 tablet (10 mg total) by mouth at bedtime. -     albuterol (PROVENTIL) (2.5 MG/3ML) 0.083% nebulizer solution; Take 3 mLs (2.5 mg total) by nebulization every 6 (six) hours as needed for wheezing or shortness of breath. -     Respiratory Therapy Supplies (NEBULIZER MASK ADULT) MISC; 1 Device by Does not apply route daily as needed (for cough, wheezing or shortness of breath). J45.41 -     Respiratory Therapy Supplies (NEBULIZER) DEVI; 1 Device by Does not apply route daily as needed (for cough, wheezing or shortness of breath). J45.41    Follow Up Instructions Return in about 5 weeks (around 07/14/2021).     I discussed the assessment and treatment plan with the patient. The patient was provided an opportunity to ask questions and all were answered. The patient agreed with the plan and demonstrated an understanding of the instructions.   The patient was advised to call back or seek an in-person evaluation if the symptoms worsen or if the condition fails to improve as anticipated.  I provided 10 minutes of non-face-to-face time during this encounter including median intraservice time, reviewing previous notes, labs, imaging, medications and explaining diagnosis and management.  [name], FNP-BC

## 2021-06-09 DIAGNOSIS — J4541 Moderate persistent asthma with (acute) exacerbation: Secondary | ICD-10-CM | POA: Diagnosis not present

## 2021-06-23 ENCOUNTER — Emergency Department (HOSPITAL_COMMUNITY): Payer: Medicaid Other

## 2021-06-23 ENCOUNTER — Encounter (HOSPITAL_COMMUNITY): Payer: Self-pay

## 2021-06-23 ENCOUNTER — Inpatient Hospital Stay (HOSPITAL_COMMUNITY)
Admission: EM | Admit: 2021-06-23 | Discharge: 2021-06-26 | DRG: 203 | Disposition: A | Discharge status: Home / Self Care | Payer: Medicaid Other | Attending: Family Medicine | Admitting: Family Medicine

## 2021-06-23 ENCOUNTER — Other Ambulatory Visit: Payer: Self-pay

## 2021-06-23 DIAGNOSIS — R000 Tachycardia, unspecified: Secondary | ICD-10-CM | POA: Diagnosis not present

## 2021-06-23 DIAGNOSIS — R0902 Hypoxemia: Secondary | ICD-10-CM | POA: Diagnosis not present

## 2021-06-23 DIAGNOSIS — J4551 Severe persistent asthma with (acute) exacerbation: Secondary | ICD-10-CM | POA: Diagnosis not present

## 2021-06-23 DIAGNOSIS — R0602 Shortness of breath: Secondary | ICD-10-CM | POA: Diagnosis not present

## 2021-06-23 DIAGNOSIS — I959 Hypotension, unspecified: Secondary | ICD-10-CM | POA: Diagnosis not present

## 2021-06-23 DIAGNOSIS — J45901 Unspecified asthma with (acute) exacerbation: Secondary | ICD-10-CM | POA: Diagnosis present

## 2021-06-23 DIAGNOSIS — I10 Essential (primary) hypertension: Secondary | ICD-10-CM | POA: Diagnosis not present

## 2021-06-23 DIAGNOSIS — Z20822 Contact with and (suspected) exposure to covid-19: Secondary | ICD-10-CM | POA: Diagnosis present

## 2021-06-23 DIAGNOSIS — J069 Acute upper respiratory infection, unspecified: Secondary | ICD-10-CM | POA: Diagnosis present

## 2021-06-23 DIAGNOSIS — R059 Cough, unspecified: Secondary | ICD-10-CM | POA: Diagnosis not present

## 2021-06-23 LAB — I-STAT VENOUS BLOOD GAS, ED
Acid-Base Excess: 1 mmol/L (ref 0.0–2.0)
Bicarbonate: 28.1 mmol/L — ABNORMAL HIGH (ref 20.0–28.0)
Calcium, Ion: 1.2 mmol/L (ref 1.15–1.40)
HCT: 48 % (ref 39.0–52.0)
Hemoglobin: 16.3 g/dL (ref 13.0–17.0)
O2 Saturation: 99 %
Potassium: 3.8 mmol/L (ref 3.5–5.1)
Sodium: 140 mmol/L (ref 135–145)
TCO2: 30 mmol/L (ref 22–32)
pCO2, Ven: 54.3 mmHg (ref 44.0–60.0)
pH, Ven: 7.321 (ref 7.250–7.430)
pO2, Ven: 142 mmHg — ABNORMAL HIGH (ref 32.0–45.0)

## 2021-06-23 LAB — CBC WITH DIFFERENTIAL/PLATELET
Abs Immature Granulocytes: 0.01 10*3/uL (ref 0.00–0.07)
Basophils Absolute: 0.1 10*3/uL (ref 0.0–0.1)
Basophils Relative: 1 %
Eosinophils Absolute: 0.9 10*3/uL — ABNORMAL HIGH (ref 0.0–0.5)
Eosinophils Relative: 12 %
HCT: 47.4 % (ref 39.0–52.0)
Hemoglobin: 15.7 g/dL (ref 13.0–17.0)
Immature Granulocytes: 0 %
Lymphocytes Relative: 17 %
Lymphs Abs: 1.2 10*3/uL (ref 0.7–4.0)
MCH: 28.1 pg (ref 26.0–34.0)
MCHC: 33.1 g/dL (ref 30.0–36.0)
MCV: 84.9 fL (ref 80.0–100.0)
Monocytes Absolute: 0.9 10*3/uL (ref 0.1–1.0)
Monocytes Relative: 12 %
Neutro Abs: 4 10*3/uL (ref 1.7–7.7)
Neutrophils Relative %: 58 %
Platelets: 363 10*3/uL (ref 150–400)
RBC: 5.58 MIL/uL (ref 4.22–5.81)
RDW: 13.6 % (ref 11.5–15.5)
WBC: 7 10*3/uL (ref 4.0–10.5)
nRBC: 0 % (ref 0.0–0.2)

## 2021-06-23 LAB — RESP PANEL BY RT-PCR (FLU A&B, COVID) ARPGX2
Influenza A by PCR: NEGATIVE
Influenza B by PCR: NEGATIVE
SARS Coronavirus 2 by RT PCR: NEGATIVE

## 2021-06-23 LAB — BASIC METABOLIC PANEL
Anion gap: 10 (ref 5–15)
BUN: 7 mg/dL (ref 6–20)
CO2: 23 mmol/L (ref 22–32)
Calcium: 9.4 mg/dL (ref 8.9–10.3)
Chloride: 105 mmol/L (ref 98–111)
Creatinine, Ser: 0.99 mg/dL (ref 0.61–1.24)
GFR, Estimated: >60 mL/min (ref >60)
Glucose, Bld: 111 mg/dL — ABNORMAL HIGH (ref 70–99)
Potassium: 3.9 mmol/L (ref 3.5–5.1)
Sodium: 138 mmol/L (ref 135–145)

## 2021-06-23 LAB — HIV ANTIBODY (ROUTINE TESTING W REFLEX): HIV Screen 4th Generation wRfx: NONREACTIVE

## 2021-06-23 MED ORDER — ALBUTEROL SULFATE (2.5 MG/3ML) 0.083% IN NEBU
10.0000 mg/h | INHALATION_SOLUTION | RESPIRATORY_TRACT | Status: DC
  Administered 2021-06-23: 10 mg/h via RESPIRATORY_TRACT
  Filled 2021-06-23: qty 3

## 2021-06-23 MED ORDER — ALBUTEROL (5 MG/ML) CONTINUOUS INHALATION SOLN
10.0000 mg/h | INHALATION_SOLUTION | Freq: Once | RESPIRATORY_TRACT | Status: AC
  Administered 2021-06-23: 10 mg/h via RESPIRATORY_TRACT
  Filled 2021-06-23: qty 20

## 2021-06-23 MED ORDER — ALBUTEROL SULFATE (2.5 MG/3ML) 0.083% IN NEBU
3.0000 mL | INHALATION_SOLUTION | RESPIRATORY_TRACT | Status: DC | PRN
  Administered 2021-06-24 (×2): 3 mL via RESPIRATORY_TRACT
  Filled 2021-06-23 (×2): qty 3

## 2021-06-23 MED ORDER — PREDNISONE 20 MG PO TABS
40.0000 mg | ORAL_TABLET | Freq: Every day | ORAL | Status: DC
  Administered 2021-06-24 – 2021-06-26 (×3): 40 mg via ORAL
  Filled 2021-06-23 (×4): qty 2

## 2021-06-23 MED ORDER — ALBUTEROL SULFATE (2.5 MG/3ML) 0.083% IN NEBU: 2.5000 mg | INHALATION_SOLUTION | RESPIRATORY_TRACT | Status: DC | PRN

## 2021-06-23 MED ORDER — ENOXAPARIN SODIUM 40 MG/0.4ML IJ SOSY
40.0000 mg | PREFILLED_SYRINGE | INTRAMUSCULAR | Status: DC
  Administered 2021-06-23 – 2021-06-25 (×3): 40 mg via SUBCUTANEOUS
  Filled 2021-06-23 (×4): qty 0.4

## 2021-06-23 MED ORDER — MONTELUKAST SODIUM 10 MG PO TABS
10.0000 mg | ORAL_TABLET | Freq: Every day | ORAL | Status: DC
  Administered 2021-06-23 – 2021-06-25 (×3): 10 mg via ORAL
  Filled 2021-06-23 (×3): qty 1

## 2021-06-23 MED ORDER — MOMETASONE FURO-FORMOTEROL FUM 200-5 MCG/ACT IN AERO
2.0000 | INHALATION_SPRAY | Freq: Two times a day (BID) | RESPIRATORY_TRACT | Status: DC
  Administered 2021-06-23 – 2021-06-26 (×6): 2 via RESPIRATORY_TRACT
  Filled 2021-06-23: qty 8.8

## 2021-06-23 MED ORDER — ALBUTEROL (5 MG/ML) CONTINUOUS INHALATION SOLN
10.0000 mg/h | INHALATION_SOLUTION | Freq: Once | RESPIRATORY_TRACT | Status: DC
  Filled 2021-06-23: qty 0.5

## 2021-06-23 NOTE — ED Provider Notes (Signed)
[facility] EMERGENCY DEPARTMENT Provider Note   CSN: [id number] Arrival date & time: 06/23/21  1356     History No chief complaint on file.   Hector Gross is a 20 y.o. male with a moderate, persistent asthma who presents emergency department with acute exacerbation.  Patient states that he has had an upper respiratory infection has increased coughing and has been using his MDI frequently.  Over the past 24 hours his coughing, breathing, wheezing has worsened and has had no relief with MDI.  Patient arrived via EMS after receiving albuterol 10 mg treatment, 1 mg of Atrovent via nebulizer, 2 g of IV magnesium, and 125 mg of Solu-Medrol with little relief.  HPI     Past Medical History:  Diagnosis Date   Asthma     Patient Active Problem List   Diagnosis Date Noted   Uncomplicated asthma    Asthma, chronic, unspecified asthma severity, with acute exacerbation 04/23/2021   Chronic asthma with acute exacerbation 04/23/2021    No past surgical history on file.     No family history on file.  Social History   Tobacco Use   Smoking status: Never   Smokeless tobacco: Never  Vaping Use   Vaping Use: Never used  Substance Use Topics   Alcohol use: No   Drug use: No    Home Medications Prior to Admission medications   Medication Sig Start Date End Date Taking? Authorizing Provider  albuterol (PROVENTIL) (2.5 MG/3ML) 0.083% nebulizer solution Take 3 mLs (2.5 mg total) by nebulization every 6 (six) hours as needed for wheezing or shortness of breath. 06/07/21   [name], NP  albuterol (VENTOLIN HFA) 108 (90 Base) MCG/ACT inhaler Inhale 2 puffs into the lungs every 6 (six) hours as needed for wheezing or shortness of breath. 06/07/21   [name], NP  fluticasone-salmeterol (ADVAIR HFA) 115-21 MCG/ACT inhaler Inhale 2 puffs into the lungs 2 (two) times daily. 06/07/21   [name], NP  montelukast (SINGULAIR) 10 MG tablet Take 1 tablet (10  mg total) by mouth at bedtime. 06/07/21 09/05/21  [name], NP  predniSONE (DELTASONE) 10 MG tablet Take 2 tablets (20 mg total) by mouth daily. 06/04/21   Wynetta Fines, MD  Respiratory Therapy Supplies (NEBULIZER MASK ADULT) MISC 1 Device by Does not apply route daily as needed (for cough, wheezing or shortness of breath). J45.41 06/07/21   [name], NP  Respiratory Therapy Supplies (NEBULIZER) DEVI 1 Device by Does not apply route daily as needed (for cough, wheezing or shortness of breath). J45.41 06/07/21   [name], NP    Allergies    Patient has no known allergies.  Review of Systems   Review of Systems Unable to review systems due to respiratory distress Physical Exam Updated Vital Signs There were no vitals taken for this visit.  Physical Exam Vitals and nursing note reviewed.  Constitutional:      General: He is not in acute distress.    Appearance: He is well-developed. He is not diaphoretic.  HENT:     Head: Normocephalic and atraumatic.  Eyes:     General: No scleral icterus.    Conjunctiva/sclera: Conjunctivae normal.  Cardiovascular:     Rate and Rhythm: Normal rate and regular rhythm.     Heart sounds: Normal heart sounds.  Pulmonary:     Effort: Tachypnea, prolonged expiration and respiratory distress present.     Breath sounds: Decreased air  movement present. Wheezing present. No rales.  Abdominal:     Palpations: Abdomen is soft.     Tenderness: There is no abdominal tenderness.  Musculoskeletal:     Cervical back: Normal range of motion and neck supple.     Right lower leg: No edema.     Left lower leg: No edema.  Skin:    General: Skin is warm and dry.  Neurological:     Mental Status: He is alert.  Psychiatric:        Behavior: Behavior normal.    ED Results / Procedures / Treatments   Labs (all labs ordered are listed, but only abnormal results are displayed) Labs Reviewed - No data to  display  EKG None  Radiology No results found.  Procedures Procedures   Medications Ordered in ED Medications - No data to display  ED Course  I have reviewed the triage vital signs and the nursing notes.  Pertinent labs & imaging results that were available during my care of the patient were reviewed by me and considered in my medical decision making (see chart for details).    MDM Rules/Calculators/A&P                           20 year old male here with asthma exacerbation.  He has gotten Solu-Medrol, magnesium 10 mg of albuterol prior to arrival and another hour-long neb treatment here in the ED.  Patient looks improved but is still wheezing significantly.  At this time patient will need a repeat treatment which he states he feels he needs.  I have given signout to PA Henderly who will assume care of the patient.  I have reviewed the patient's labs which show no acute abnormalities.  Chest x-ray is without acute abnormality. Final Clinical Impression(s) / ED Diagnoses Final diagnoses:  None    Rx / DC Orders ED Discharge Orders     None        Arthor Captain, PA-C 06/24/21 1357    Cheryll Cockayne, MD 07/01/21 [phone]

## 2021-06-23 NOTE — ED Notes (Signed)
Pt ambulated down the hall O2 saturations 89-95% RA with HR elevated to 150.  Pt when entering room started to become winded and pale.  When asked if he felt his HR elevation he did confirm being symptomatic

## 2021-06-23 NOTE — H&P (Addendum)
Family Medicine Teaching [facility] Admission History and Physical Service Pager: [phone]  [name] Medical record number: [id number] Date of birth: 2001-08-21 Age: 20 y.o. Gender: male  Primary Care Provider: [name], NP Consultants: None Code Status: Full Preferred Emergency Contact:  Mother Contact Information     Name Relation Home Work Mobile   [place] Mother   [phone]      Chief Complaint: Shortness of breath   Assessment and Plan: Hector Gross is a 20 y.o. male presenting with SOB onset 06/22/21. PMH is significant for asthma.   Shortness of breath likely secondary to asthma exacerbation  Patient presented to the emergency room with onset of shortness of breath that began on 06/22/21 after respiratory viral infection. On route patient received albuterol, Atrovent, Solu-Medrol and 2 g of magnesium via EMS.  Of note, he did just finish a prednisone taper at the end of August for a separate asthma exacerbation per chart review. Pertinent ED labs/imaging include: VBG that shows no evidence of respiratory acidosis, glucose 111, hemoglobin 15.7, WBC 7.  Patient's EKG showed sinus tachycardia no acute ST changes and unchanged from previous in 04/2021, chest x-ray shows no active disease. The likely cause of his shortness of breath would be asthma exacerbation that was likely aggravated by an upper respiratory virus.  Patient states that last exacerbation was in July 2022 and states that he has been compliant with medications.  Home medications include Advair, Albuterol, and Singulair.  He takes all these as needed but is prescribed to take the Advair twice daily.  Pt has wheezing on exam and stated he had ~50% improvement of symptoms after receiving nebulizer treatment x2 in the ED.  Patient meets moderate to severe asthma classification. Currently the patient is satting in the upper 90s on room air. Pulmonary embolism is included on the  differential but is less likely. Wells score 1.5 due to tachycardia, and pt is tachycardic to 130s, however, he did receive albuterol.  He also notes that this episode of SOB is very similar to his previous asthma exacerbations. There was no evidence of DVT on exam, no history of recent surgery, malignancy, or sitting for long periods of time.  EMS administered 10mg  Albuterol, 1mg  atrovent, 125mg  solumedrol, 2 g mag. Received nebulizer treatment in the ED. -Admit to med tele, attending Dr. -Continuous pulse ox and cardiac monitoring -VS per floor protocol  -BMP/CBC tomorrow, creatinine (creatinine weekly) -Albuterol nebulizer Q4hr PRN -Dulera 2 puffs 2xdaily  -Singulair 10mg  daily  -Monitor respiratory status >92% O2 sat -O2 sats monitored with ambulation -S/p Solu-Medrol via EMS, will give prednisone 40mg  daily x4 additional days  -Recommend ongoing discussion of proper use of his medications, was taking his controller medication as needed that was prescribed twice daily and has had multiple ED visits due to asthma exacerbation.  Tachycardia: Heart rates ranging in the 120s since admission.  EKG shows sinus tachycardia no acute ST changes and unchanged from previous.  Likely due to asthma exacerbation. As previously mentioned above, pulmonary embolism is not a likely cause of his symptoms.  -Continuous cardiac monitoring -Continuous pulse ox  -if we have concern for PE we can pursue further work up including CTPA  Chest discomfort: Patient endorses some chest discomfort that he states is worse when he takes deep breaths.  Well score of 1.5.  EKG does not show any changes from previous to suggest a pulmonary embolism and patient does not have any risk factors  for this.  Likely secondary to his coughing.  Patient does believe that it got worse due to him having significant coughing.  Physical exam shows the pain is not entirely reproducible through palpation.  Chest x-ray negative for  acute findings.  Low suspicion for ACS or PE. -We will continue to monitor closely  Medication Management  Patient has had multiple admissions in the past for asthma exacerbations, three times in the last two months.  Unsure if he is taking his home medications as prescribed.  He reports that he is taking his Advair, Singulair, and albuterol as needed.  However, he is prescribed his Advair twice daily.  We can adjust medications if deemed appropriate and use teach back methods to ensure that he is taking medications correctly prior to discharge.   FEN/GI: Regular  Prophylaxis: Lovenox  Disposition: Med tele   History of Present Illness:  Hector Gross is a 20 y.o. male presenting with acute asthma exacerbation onset yesterday on 06/22/2021. He started feeling short of breath and wheezing yesterday. It all started after an upper respiratory virus began, and patient denies any sick contacts. He has had sore throat, cough, congestion, some chest pains he thinks are related to the coughing. Pt reports that he takes albuterol, Advair, and Singulair as needed and is compliant with his medications.  However, the patient is prescribed Advair 2 puffs 2 times daily and he reported to [place] that he only takes his Advair as needed.  Pt's last asthma exacerbation was in July 2022 and his current symptoms feel like his other asthma exacerbations.  Patient also reports that with exertion his symptoms worsen and he can feel his heart rate increase which is similar to previous exacerbations.  He notes that he is awoken by his asthma 3 times a month, has symptoms once to twice daily and is always symptomatic with exercise.  He denies other medical problerms or other medications besides the above.   Lives at home with parents. Previously smoked marijuana, but stopped this past July after an asthma exacerbation. Denies tobacco or alcohol. Denies other drugs.   Review Of Systems: Per HPI with the following additions:    Review of Systems  Constitutional:  Negative for fever.  HENT:  Positive for congestion and sore throat.   Respiratory:  Positive for cough and shortness of breath.   Cardiovascular:  Positive for chest pain.  Gastrointestinal:  Negative for nausea and vomiting.  Genitourinary:  Negative for dysuria.  Musculoskeletal:  Negative for myalgias.    Patient Active Problem List   Diagnosis Date Noted   Asthma exacerbation 06/23/2021   Uncomplicated asthma    Asthma, chronic, unspecified asthma severity, with acute exacerbation 04/23/2021   Chronic asthma with acute exacerbation 04/23/2021    Past Medical History: Past Medical History:  Diagnosis Date   Asthma     Past Surgical History: History reviewed. No pertinent surgical history.  Social History: Social History   Tobacco Use   Smoking status: Never   Smokeless tobacco: Never  Vaping Use   Vaping Use: Never used  Substance Use Topics   Alcohol use: No   Drug use: No   Additional social history:   Please also refer to relevant sections of EMR.  Family History: History reviewed. No pertinent family history.   Allergies and Medications: No Known Allergies No current facility-administered medications on file prior to encounter.   Current Outpatient Medications on File Prior to Encounter  Medication Sig Dispense Refill  Acetaminophen (TYLENOL PO) Take 2 tablets by mouth every 6 (six) hours as needed (headache/pain).     albuterol (PROVENTIL) (2.5 MG/3ML) 0.083% nebulizer solution Take 3 mLs (2.5 mg total) by nebulization every 6 (six) hours as needed for wheezing or shortness of breath. 150 mL 1   albuterol (VENTOLIN HFA) 108 (90 Base) MCG/ACT inhaler Inhale 2 puffs into the lungs every 6 (six) hours as needed for wheezing or shortness of breath. 8 g 2   fluticasone-salmeterol (ADVAIR HFA) 115-21 MCG/ACT inhaler Inhale 2 puffs into the lungs 2 (two) times daily. (Patient taking differently: Inhale 2 puffs into the  lungs 2 (two) times daily as needed (shortness of breath/wheezing).) 1 each 12   montelukast (SINGULAIR) 10 MG tablet Take 1 tablet (10 mg total) by mouth at bedtime. 90 tablet 1   Respiratory Therapy Supplies (NEBULIZER MASK ADULT) MISC 1 Device by Does not apply route daily as needed (for cough, wheezing or shortness of breath). J45.41 1 each PRN   Respiratory Therapy Supplies (NEBULIZER) DEVI 1 Device by Does not apply route daily as needed (for cough, wheezing or shortness of breath). J45.41 1 each PRN    Objective: BP 128/83   Pulse (!) 130   Temp 98.3 F (36.8 C) (Oral)   Resp 19   Ht 5\' 10"  (1.778 m)   Wt 99.8 kg   SpO2 92%   BMI 31.57 kg/m  Physical Exam Vitals (Tachycardia) reviewed.  Constitutional:      General: He is not in acute distress.    Appearance: He is not ill-appearing or toxic-appearing.  Cardiovascular:     Rate and Rhythm: Regular rhythm. Tachycardia present.  Pulmonary:     Effort: No respiratory distress.     Breath sounds: Decreased air movement present.     Comments: Bilateral diffuse wheezing Abdominal:     General: Bowel sounds are normal.     Palpations: Abdomen is soft.     Tenderness: There is no abdominal tenderness.  Skin:    General: Skin is warm and dry.  Neurological:     Mental Status: He is alert and oriented to person, place, and time.  Psychiatric:        Mood and Affect: Mood normal.        Behavior: Behavior normal.     Labs and Imaging: CBC BMET  Recent Labs  Lab 06/23/21 1405 06/23/21 1458  WBC 7.0  --   HGB 15.7 16.3  HCT 47.4 48.0  PLT 363  --    Recent Labs  Lab 06/23/21 1405 06/23/21 1458  NA 138 140  K 3.9 3.8  CL 105  --   CO2 23  --   BUN 7  --   CREATININE 0.99  --   GLUCOSE 111*  --   CALCIUM 9.4  --      EKG: My own interpretation : sinus tachycardia no acute ST changes and unchanged from previous in 04/2021   Chest x-ray shows no active disease  05/2021, MD 06/23/2021, 11:11 PM PGY-1,  [facility] FPTS Intern pager: [phone], text pages welcome   Upper Level Addendum:  I have seen and evaluated this patient along with Dr. [phone] and reviewed the above note, making necessary revisions as appropriate.  I agree with the medical decision making and physical exam as noted above.  Jena Gauss, DO PGY-3 [facility]

## 2021-06-23 NOTE — ED Provider Notes (Addendum)
Care assumed from previous provider. See note for full HPI.  In summation SOB x 2 days. Hx of asthma. Received Albuterol, Atrovent, Solumedrol and 2 g Mag PTA with EMS.  Received 1 hr continuous neb here  Still wheezy however feels better, Tachycardic.  FU after repeat hour long neb  If improved can dc home with steroids and close FU however if not improved Admit  Has been admitted previous, Last July 2022  Just finish Prednise end of Aug per PCP note. Taking Singulair and Albuterol daily.  [facility]  Physical Exam  BP (!) 145/82   Pulse (!) 135   Resp 19   Ht 5\' 10"  (1.778 m)   Wt 99.8 kg   SpO2 94%   BMI 31.57 kg/m   Physical Exam Vitals and nursing note reviewed.  Constitutional:      General: He is not in acute distress.    Appearance: He is well-developed. He is not ill-appearing, toxic-appearing or diaphoretic.  HENT:     Head: Atraumatic.  Eyes:     Pupils: Pupils are equal, round, and reactive to light.  Cardiovascular:     Rate and Rhythm: Normal rate and regular rhythm.  Pulmonary:     Effort: Pulmonary effort is normal. No respiratory distress.     Breath sounds: Wheezing present.     Comments: Diffuse expiratory wheeze. Abdominal:     General: There is no distension.     Palpations: Abdomen is soft.  Musculoskeletal:        General: Normal range of motion.     Cervical back: Normal range of motion and neck supple.     Right lower leg: No tenderness. No edema.     Left lower leg: No tenderness. No edema.  Skin:    General: Skin is warm and dry.  Neurological:     General: No focal deficit present.     Mental Status: He is alert and oriented to person, place, and time.    ED Course/Procedures     .Critical Care Performed by: , PA-C Authorized by: Linwood Dibbles, PA-C   Critical care provider statement:    Critical care time (minutes):  35   Critical care was necessary to treat or prevent  imminent or life-threatening deterioration of the following conditions:  Respiratory failure   Critical care was time spent personally by me on the following activities:  Discussions with consultants, evaluation of patient's response to treatment, examination of patient, ordering and performing treatments and interventions, ordering and review of laboratory studies, ordering and review of radiographic studies, pulse oximetry, re-evaluation of patient's condition, obtaining history from patient or surrogate and review of old charts Labs Reviewed  BASIC METABOLIC PANEL - Abnormal; Notable for the following components:      Result Value   Glucose, Bld 111 (*)    All other components within normal limits  CBC WITH DIFFERENTIAL/PLATELET - Abnormal; Notable for the following components:   Eosinophils Absolute 0.9 (*)    All other components within normal limits  I-STAT VENOUS BLOOD GAS, ED - Abnormal; Notable for the following components:   pO2, Ven 142.0 (*)    Bicarbonate 28.1 (*)    All other components within normal limits  RESP PANEL BY RT-PCR (FLU A&B, COVID) [id number]   DG Chest 2 View  Result Date: 06/04/2021 CLINICAL DATA:  Cough EXAM: CHEST - 2 VIEW COMPARISON:  04/23/2021 FINDINGS: The heart size and mediastinal contours are within  normal limits. Both lungs are clear. The visualized skeletal structures are unremarkable. IMPRESSION: Normal study. Electronically Signed   By: Charlett Nose M.D.   On: 06/04/2021 20:39   DG Chest Port 1 View  Result Date: 06/23/2021 CLINICAL DATA:  Shortness of breath. EXAM: PORTABLE CHEST 1 VIEW COMPARISON:  06/04/2021 FINDINGS: 1436 hours. The lungs are clear without focal pneumonia, edema, pneumothorax or pleural effusion. The cardiopericardial silhouette is within normal limits for size. The visualized bony structures of the thorax show no acute abnormality. Telemetry leads overlie the chest. IMPRESSION: No active disease. Electronically Signed   By: Kennith Center M.D.   On: 06/23/2021 15:07    MDM  FU after repeat hour long Neb  Patient reassessed.  Has had multiple treatments including magnesium, steroids, albuterol, Atrovent, 2 rounds of 1 hour continuous nebulizers.  Still have expiratory wheeze.  States he feels improved however not at his baseline.  He is still tachycardic.  He denies any chest pain, lower extremity edema.  No prior history of PE or DVT.  Low suspicion for acute infectious process, PE. Feel this is likely acute asthma exacerbation.  Patient reassessed.  He still has moderate wheeze.  Attempted to ambulate patient he became hypoxic to 88-89 % on RA, tachycardic, tachypneic and had worsening wheeze.  Was able to recover and O2 up to 96% on RA while sitting however given patient is still symptomatic would likely benefit from admission for continued management.  Patient is agreeable for admission.  CONSULT with Teaching service who agrees to evaluate patient for admission  The patient appears reasonably stabilized for admission considering the current resources, flow, and capabilities available in the ED at this time, and I doubt any other [facility] requiring further screening and/or treatment in the ED prior to admission.          Linwood Dibbles, PA-C 06/23/21 1917    Tegeler, Canary Brim, MD 06/24/21 [phone]

## 2021-06-23 NOTE — ED Triage Notes (Signed)
Pt from home BIB EMS for c/o SHOB/wheezing  x 2 days. Hx of asthma. EMS administered 10mg  Albuterol, 1mg  Atrovent, 125mg  Solumedrol IV, and 2 gm Mag IV. Pt wheezing on arrival.  BP 148/98, HR 128, RR 28, SpO2 91-98% on NEB. GCS 15.

## 2021-06-24 DIAGNOSIS — R0602 Shortness of breath: Secondary | ICD-10-CM | POA: Diagnosis not present

## 2021-06-24 DIAGNOSIS — J45901 Unspecified asthma with (acute) exacerbation: Secondary | ICD-10-CM | POA: Diagnosis present

## 2021-06-24 DIAGNOSIS — Z20822 Contact with and (suspected) exposure to covid-19: Secondary | ICD-10-CM | POA: Diagnosis not present

## 2021-06-24 DIAGNOSIS — R0902 Hypoxemia: Secondary | ICD-10-CM | POA: Diagnosis not present

## 2021-06-24 DIAGNOSIS — J4551 Severe persistent asthma with (acute) exacerbation: Principal | ICD-10-CM

## 2021-06-24 DIAGNOSIS — J069 Acute upper respiratory infection, unspecified: Secondary | ICD-10-CM | POA: Diagnosis not present

## 2021-06-24 DIAGNOSIS — R000 Tachycardia, unspecified: Secondary | ICD-10-CM | POA: Diagnosis not present

## 2021-06-24 LAB — CBC
HCT: 47.8 % (ref 39.0–52.0)
Hemoglobin: 16 g/dL (ref 13.0–17.0)
MCH: 27.9 pg (ref 26.0–34.0)
MCHC: 33.5 g/dL (ref 30.0–36.0)
MCV: 83.3 fL (ref 80.0–100.0)
Platelets: 412 10*3/uL — ABNORMAL HIGH (ref 150–400)
RBC: 5.74 MIL/uL (ref 4.22–5.81)
RDW: 13.7 % (ref 11.5–15.5)
WBC: 7 10*3/uL (ref 4.0–10.5)
nRBC: 0 % (ref 0.0–0.2)

## 2021-06-24 LAB — BASIC METABOLIC PANEL
Anion gap: 10 (ref 5–15)
BUN: 9 mg/dL (ref 6–20)
CO2: 23 mmol/L (ref 22–32)
Calcium: 10 mg/dL (ref 8.9–10.3)
Chloride: 104 mmol/L (ref 98–111)
Creatinine, Ser: 1.07 mg/dL (ref 0.61–1.24)
GFR, Estimated: >60 mL/min (ref >60)
Glucose, Bld: 146 mg/dL — ABNORMAL HIGH (ref 70–99)
Potassium: 4.1 mmol/L (ref 3.5–5.1)
Sodium: 137 mmol/L (ref 135–145)

## 2021-06-24 MED ORDER — ALBUTEROL SULFATE (2.5 MG/3ML) 0.083% IN NEBU: 2.5000 mg | INHALATION_SOLUTION | Freq: Four times a day (QID) | RESPIRATORY_TRACT | 1 refills | Status: DC | PRN

## 2021-06-24 MED ORDER — SPIRIVA HANDIHALER 18 MCG IN CAPS: 18.0000 ug | ORAL_CAPSULE | Freq: Every day | RESPIRATORY_TRACT | 2 refills | Status: DC

## 2021-06-24 MED ORDER — PREDNISONE 20 MG PO TABS: 40.0000 mg | ORAL_TABLET | Freq: Every day | ORAL | 0 refills | Status: AC

## 2021-06-24 MED ORDER — ALBUTEROL SULFATE HFA 108 (90 BASE) MCG/ACT IN AERS: 2.0000 | INHALATION_SPRAY | Freq: Four times a day (QID) | RESPIRATORY_TRACT | 2 refills | Status: DC | PRN

## 2021-06-24 MED ORDER — ALBUTEROL SULFATE (2.5 MG/3ML) 0.083% IN NEBU
3.0000 mL | INHALATION_SOLUTION | RESPIRATORY_TRACT | Status: DC
  Administered 2021-06-24 – 2021-06-25 (×6): 3 mL via RESPIRATORY_TRACT
  Filled 2021-06-24 (×6): qty 3

## 2021-06-24 NOTE — Discharge Instructions (Addendum)
It was a pleasure to take care of you while you were in the hospital. While here you were treated for an asthma attack.  Start taking Spiriva inhaler twice a day every day Continue taking prednisone 40 mg once a day for 1 more day and then stop Continue taking Advair 1 puff twice a day every day to control asthma Use your albuterol inhaler/nebulizer as needed for wheezing, cough, shortness of breath Follow up with your primary care provider within the next week  If you have shortness of breath, feel like you cannot catch your breath, and this does not improve with using albuterol as a rescue inhaler, please go to the nearest emergency room as soon as possible.

## 2021-06-24 NOTE — Progress Notes (Signed)
Patient's vital signs Mews turned to yellow. Patient is alert and oriented. No pain and discomfort noted. Recheck vital signs  MEWs turned to green. Discussed patient's conditions with another nurse. Will continue monitor.

## 2021-06-24 NOTE — Progress Notes (Signed)
Family Medicine Teaching Service Daily Progress Note Intern Pager: [phone]  [name] Medical record number: [id number] Date of birth: 02-May-2001 Age: 20 y.o. Gender: male  Primary Care Provider: [name], NP Consultants: None Code Status: Full  Pt Overview and Major Events to Date:  9/9: admitted for asthma exacerbation  Assessment and Plan: Hector Gross is a 20 year old man presenting with an acute asthma exacerbation due to upper respiratory infection.  His past medical history is significant for asthma.  Acute asthma exacerbation due to URI Overnight patient did well, needed several albuterol nebulizers. He is satting well on room air, but dropped to 90% SPO2 while speaking.  Patient still has significant wheezing during inspiratory and expiratory phases in all lung fields. Patient had one does of solumedrol yesterday and is scheduled to get prednisone 40 mg PO today. He is on dulera MDI in hospital BID (per formulary, home med is advair).  Patient reports that he is compliant with Advair at home and uses it twice a day.  He has had 3 exacerbations in the last 2 months that resulted in hospital visits.  Patient has severe persistent asthma. -Albuterol neb q4h scheduled -Prednisone 40 mg PO qd x4d -Singulair 10 mg daily -Monitor respiratory status >92% O2 sat  Tachycardia, resolved Patient had elevated heart rates to the 120s during admission.  Today his heart rate is appropriate at 79 this morning.  This is most likely due to his albuterol usage for his asthma exacerbation.  We will continue cardiac monitoring.  EKG showed no acute ST changes, sinus tachycardia.  Not concerned for ACS.  Chest discomfort Patient has pleuritic pain when taking deep breaths.  EKG showed sinus tach otherwise normal.  Suspect this is due to coughing and asthma exacerbation.  Chest x-ray was negative for acute findings.  Will monitor, however think this is a physiologic cause  of coughing and acute asthma exacerbation.  FEN/GI: Regular PPx: Lovenox Dispo:Home pending clinical improvement . Barriers include acute asthma exacerbation.   Subjective:  Patient reports that he did well overnight, no complaints. He does become SOB with speaking, but can speak in full sentences.  Objective: Temp:  [98.2 F (36.8 C)-98.6 F (37 C)] 98.2 F (36.8 C) (09/10 0804) Pulse Rate:  [79-135] 79 (09/10 0804) Resp:  [19-23] 20 (09/10 0804) BP: (106-146)/(65-98) 133/73 (09/10 0804) SpO2:  [90 %-100 %] 96 % (09/10 0804) Weight:  [99.8 kg] 99.8 kg (09/09 1412) Physical Exam: General: young AAM, resting comfortably in bed, NAD, alert, WNWD Cardiovascular: RRR, no m/r/g, 2+ radial pulses Respiratory: Cough, no iWOB, wheezing present during inspiration and prolonged expiratory phase in all lung fields Abdomen: soft, NT, ND, normal bowel sounds Extremities: warm, 2+DP, no edema  Laboratory: Recent Labs  Lab 06/23/21 1405 06/23/21 1458 06/24/21 0015  WBC 7.0  --  7.0  HGB 15.7 16.3 16.0  HCT 47.4 48.0 47.8  PLT 363  --  412*   Recent Labs  Lab 06/23/21 1405 06/23/21 1458 06/24/21 0015  NA 138 140 137  K 3.9 3.8 4.1  CL 105  --  104  CO2 23  --  23  BUN 7  --  9  CREATININE 0.99  --  1.07  CALCIUM 9.4  --  10.0  GLUCOSE 111*  --  146*   Imaging/Diagnostic Tests: DG Chest Port 1 View  Result Date: 06/23/2021 CLINICAL DATA:  Shortness of breath. EXAM: PORTABLE CHEST 1 VIEW COMPARISON:  06/04/2021 FINDINGS: 1436  hours. The lungs are clear without focal pneumonia, edema, pneumothorax or pleural effusion. The cardiopericardial silhouette is within normal limits for size. The visualized bony structures of the thorax show no acute abnormality. Telemetry leads overlie the chest. IMPRESSION: No active disease. Electronically Signed   By: Kennith Center M.D.   On: 06/23/2021 15:07     Shirlean Mylar, MD 06/24/2021, 9:29 AM PGY-3, [facility] FPTS Intern  pager: [phone], text pages welcome

## 2021-06-24 NOTE — Progress Notes (Signed)
Patient's 02 sat is 85% in room air and 94 % with oxygen 2L while ambulating.

## 2021-06-24 NOTE — Discharge Summary (Addendum)
Family Medicine Teaching [facility] Discharge Summary  [name] Medical record number: [id number] Date of birth: 04-23-2001 Age: 20 y.o. Gender: male Date of Admission: 06/23/2021  Date of Discharge: 06/26/2021 Admitting Physician: Shirlean Mylar, MD  Primary Care Provider: [name], NP Consultants: None  Indication for Hospitalization: Asthma exacerbation  Discharge Diagnoses/Problem List:  Asthma exacerbation due to URI Severe persistent asthma  Disposition: Home  Discharge Condition: Stable and improved  Discharge Exam:  Blood pressure 115/70, pulse 87, temperature 97.6 F (36.4 C), temperature source Oral, resp. rate 18, height 5\' 10"  (1.778 m), weight 99.8 kg, SpO2 92 %. Physical Exam Constitutional:      General: He is not in acute distress.    Appearance: He is well-developed. He is not ill-appearing.  Cardiovascular:     Rate and Rhythm: Normal rate and regular rhythm.  Pulmonary:     Effort: Pulmonary effort is normal without respiratory distress     Comments: Mild wheezing present throughout  Abdominal:     Palpations: Abdomen is soft.     Tenderness: There is no abdominal tenderness.  Skin:    General: Skin is warm and dry.  Neurological:     Mental Status: He is alert.  Psychiatric:        Mood and Affect: Mood normal.        Behavior: Behavior normal.     Brief Hospital Course:  Acute asthma exacerbation due to URI Patient presented yesterday with shortness of breath and wheezing that began on June 22, 2021.  He had developed a viral URI a few days before this.  EMS gave him 10 mg albuterol, 1 mg Atrovent, 125 mg Solu-Medrol, 2 g exam.  In the ED patient remained short of breath, with wheezing, and had SPO2 desaturations with walking to 88%.  Patient reports that he uses Advair twice a day for asthma control, albuterol nebulizer for rescue, and montelukast daily.  This is his third acute asthma exacerbation in 2 months,  and second hospital admission since July.  He was admitted overnight and given albuterol nebulizers every 4 hours as needed, and continued on prednisone August x4 days.because patient reports compliance with Advair, and given multiple recent exacerbations, is recommended he start a LAMA as well.  Patient prescribed Spiriva 1 puff twice daily as well.  Patient did well overnight, remained on room air and maintain saturations.  He is stable and ready for discharge. -Continue prednisone 40 mg for 1 more day -Start Spiriva 1 puff twice daily -Continue Advair 1 puff twice daily -Continue albuterol nebulizer/MDI for rescue   Issues for Follow Up:  Follow-up with primary care provider to ensure resolution of asthma exacerbation and control over symptoms  Significant Procedures: None  Significant Labs and Imaging:  Recent Labs  Lab 06/23/21 1405 06/23/21 1458 06/24/21 0015  WBC 7.0  --  7.0  HGB 15.7 16.3 16.0  HCT 47.4 48.0 47.8  PLT 363  --  412*   Recent Labs  Lab 06/23/21 1405 06/23/21 1458 06/24/21 0015  NA 138 140 137  K 3.9 3.8 4.1  CL 105  --  104  CO2 23  --  23  GLUCOSE 111*  --  146*  BUN 7  --  9  CREATININE 0.99  --  1.07  CALCIUM 9.4  --  10.0   Chest x-ray from 06/23/2021 showed no active disease   Results/Tests Pending at Time of Discharge: None  Discharge Medications:  Allergies  as of 06/26/2021   No Known Allergies      Medication List     TAKE these medications    albuterol 108 (90 Base) MCG/ACT inhaler Commonly known as: VENTOLIN HFA Inhale 2 puffs into the lungs every 6 (six) hours as needed for wheezing or shortness of breath.   albuterol (2.5 MG/3ML) 0.083% nebulizer solution Commonly known as: PROVENTIL Take 3 mLs (2.5 mg total) by nebulization every 6 (six) hours as needed for wheezing or shortness of breath.   fluticasone-salmeterol 115-21 MCG/ACT inhaler Commonly known as: ADVAIR HFA Inhale 2 puffs into the lungs 2 (two) times  daily. What changed:  when to take this reasons to take this   montelukast 10 MG tablet Commonly known as: Singulair Take 1 tablet (10 mg total) by mouth at bedtime.   Nebulizer Mask Adult Misc 1 Device by Does not apply route daily as needed (for cough, wheezing or shortness of breath). J45.41   Nebulizer Devi 1 Device by Does not apply route daily as needed (for cough, wheezing or shortness of breath). J45.41   predniSONE 20 MG tablet Commonly known as: DELTASONE Take 2 tablets (40 mg total) by mouth daily with breakfast for 3 days.   Spiriva HandiHaler 18 MCG inhalation capsule Generic drug: tiotropium Place 1 capsule (18 mcg total) into inhaler and inhale daily.   TYLENOL PO Take 2 tablets by mouth every 6 (six) hours as needed (headache/pain).         Discharge Instructions: Please refer to Patient Instructions section of EMR for full details.  Patient was counseled important signs and symptoms that should prompt return to medical care, changes in medications, dietary instructions, activity restrictions, and follow up appointments.   Follow-Up Appointments:  Follow-up Information     [name], NP. Call in 2 day(s).   Specialty: Nurse Practitioner Contact information: [address] [place] [address] [phone]                 Alfredo Martinez, MD 06/26/2021, 12:22 PM PGY-1, [facility]

## 2021-06-24 NOTE — Hospital Course (Signed)
Acute asthma exacerbation due to URI Patient presented yesterday with shortness of breath and wheezing that began on June 22, 2021.  He had developed a viral URI a few days before this.  EMS gave him 10 mg albuterol, 1 mg Atrovent, 125 mg Solu-Medrol, 2 g exam.  In the ED patient remained short of breath, with wheezing, and had SPO2 desaturations with walking to 88%.  Patient reports that he uses Advair twice a day for asthma control, albuterol nebulizer for rescue, and montelukast daily.  This is his third acute asthma exacerbation in 2 months, and second hospital admission since July.  He was admitted overnight and given albuterol nebulizers every 4 hours as needed, and continued on prednisone x4 days.because patient reports compliance with Advair, and given multiple recent exacerbations, is recommended he start a LAMA as well.  Patient prescribed Spiriva 1 puff twice daily as well.  Patient did well overnight, remained on room air and maintain saturations.  He is stable and ready for discharge. -Continue prednisone 40 mg for 3 more days -Start Spiriva 1 puff twice daily -Continue Advair 1 puff twice daily -Continue albuterol nebulizer/MDI for rescue

## 2021-06-25 DIAGNOSIS — J45901 Unspecified asthma with (acute) exacerbation: Secondary | ICD-10-CM | POA: Diagnosis not present

## 2021-06-25 DIAGNOSIS — J4551 Severe persistent asthma with (acute) exacerbation: Secondary | ICD-10-CM | POA: Diagnosis not present

## 2021-06-25 MED ORDER — METHYLPREDNISOLONE SODIUM SUCC 125 MG IJ SOLR
60.0000 mg | Freq: Once | INTRAMUSCULAR | Status: AC
  Administered 2021-06-25: 60 mg via INTRAVENOUS
  Filled 2021-06-25: qty 2

## 2021-06-25 MED ORDER — ALBUTEROL SULFATE (2.5 MG/3ML) 0.083% IN NEBU
3.0000 mL | INHALATION_SOLUTION | Freq: Four times a day (QID) | RESPIRATORY_TRACT | Status: DC
  Administered 2021-06-25 – 2021-06-26 (×2): 3 mL via RESPIRATORY_TRACT
  Filled 2021-06-25 (×3): qty 3

## 2021-06-25 NOTE — Progress Notes (Signed)
FPTS Brief Progress Note  S: Patient seen at bedside for evening rounds. He denies complaints at this time. Reports his breathing is improved from prior. States he does not feel short of breath at rest or when ambulating to/from the bathroom.  Discussed case with primary RN who does not have any concerns.  O: BP 132/71 (BP Location: Right Arm)   Pulse 88   Temp 97.7 F (36.5 C) (Oral)   Resp 18   Ht 5\' 10"  (1.778 m)   Wt 99.8 kg   SpO2 96%   BMI 31.57 kg/m   Gen: alert, no acute distress Resp: normal work of breathing on room air, diffuse expiratory wheezes Abd: soft, nontender Ext: no peripheral edema  A/P: Patient initially on 2L  when I entered the room. Switched to room air and he was able to maintain SpO2 without difficulty. Still with fairly diffuse wheezes. -Plan per day team -Anticipate discharge tomorrow -Orders reviewed. Labs for AM not ordered, not indicated   , MD 06/25/2021, 12:14 AM PGY-2, [place] Night Resident  Please page [phone] with questions.

## 2021-06-25 NOTE — Progress Notes (Signed)
SATURATION QUALIFICATIONS: (This note is used to comply with regulatory documentation for home oxygen)  Patient Saturations on Room Air at Rest = 94%  Patient Saturations on Room Air while Ambulating = 90-96 %  Patient Saturations on 0 Liters of oxygen while Ambulating = 90-96%  Please briefly explain why patient needs home oxygen:

## 2021-06-25 NOTE — Progress Notes (Signed)
Patient desats in the 85's around 0500 but asymptomatic. Oxygen 2L in situ. Patient comfortable now.

## 2021-06-25 NOTE — Progress Notes (Signed)
RT NOTES: PT found on room air. Sats 88%. Placed patient back on 2lpm nasal cannula. Sats increased to 91%. Will continue to monitor.

## 2021-06-25 NOTE — Progress Notes (Signed)
Family Medicine Teaching Service Daily Progress Note Intern Pager: [phone]  [name] Medical record number: [id number] Date of birth: [birth year] Age: 20 y.o. Gender: male  Primary Care Provider: [name], NP Consultants: None Code Status: Full  Pt Overview and Major Events to Date:  9/9: Admitted   Assessment and Plan: Clance Boll. Lakey is a 20 year old man presenting with an acute asthma exacerbation due to upper respiratory infection.  His past medical history is significant for asthma.   Acute asthma exacerbation  In the setting of history of severe persistent asthma. Patient continues to improve, initially required oxygen due to satting in 80s but after midnight did well on room air. Again this morning noted to be in the low 80s and placed on 2L which was removed. Patient now satting appropriately on room air, will ambulate to ensure patient's respiratory status is appropriate with hopeful discharge today. -Albuterol neb q4h scheduled -Prednisone 40 mg PO qd (9/9-9/13) -Singulair 10 mg daily -Monitor respiratory status >92% O2 sat -ambulate with pulse ox, discharge if O2 sat >92%  FEN/GI: regular PPx: lovenox  Dispo:Home pending clinical improvement . Barriers include demonstrating appropriate pulse ox with ambulation.   Subjective:  No acute overnight events reported. Patient doing well, denies any concerns. Denies dyspnea. Hopeful to be able to go home today.   Objective: Temp:  [97.7 F (36.5 C)-98.9 F (37.2 C)] 98.3 F (36.8 C) (09/11 0506) Pulse Rate:  [71-112] 73 (09/11 0506) Resp:  [18-20] 18 (09/11 0506) BP: (125-150)/(70-89) 125/80 (09/11 0506) SpO2:  [89 %-98 %] 93 % (09/11 0506) Physical Exam: General: Patient sitting upright in bed, in no acute distress. Cardiovascular: RRR, no murmurs or gallops auscultated Respiratory: minimal wheezing noted on exam with non-focal findings, breathing comfortably on room air without  respiratory distress Abdomen: soft, nontender, nondistended, presence of bowel sounds Extremities: no LE edema noted bilaterally, radial and distal pulses strong and equal bilaterally   Laboratory: Recent Labs  Lab 06/23/21 1405 06/23/21 1458 06/24/21 0015  WBC 7.0  --  7.0  HGB 15.7 16.3 16.0  HCT 47.4 48.0 47.8  PLT 363  --  412*   Recent Labs  Lab 06/23/21 1405 06/23/21 1458 06/24/21 0015  NA 138 140 137  K 3.9 3.8 4.1  CL 105  --  104  CO2 23  --  23  BUN 7  --  9  CREATININE 0.99  --  1.07  CALCIUM 9.4  --  10.0  GLUCOSE 111*  --  146*      Imaging/Diagnostic Tests: No results found.   Reece Leader, DO 06/25/2021, 7:40 AM PGY-2,  Family Medicine FPTS Intern pager: [phone], text pages welcome

## 2021-06-26 NOTE — Progress Notes (Signed)
AVS reviewed with patient/family. All questions answered at this time. Transport provided by pt's father.

## 2021-06-26 NOTE — Progress Notes (Addendum)
Family Medicine Teaching Service Daily Progress Note Intern Pager: [phone]  [name] Medical record number: [id number] Date of birth: 07-27-2001 Age: 20 y.o. Gender: male  Primary Care Provider: [name], NP Consultants: None Code Status: Full  Pt Overview and Major Events to Date:  9/9: Admitted to FPTS  Assessment and Plan: Hector Gross is a 20 yo man presenting with an acute asthma exacerbation due to a URI. PMHx significant for asthma with multiple hospital admissions for asthma exacerbation.   Acute asthma exacerbation Patient has had multiple hospital admissions for severe persistent asthma.  He has been improving daily and initially required oxygen.  He has intermittently needed nasal cannula throughout this admission but has been on room air for approximately 12 hours and is satting at 97%. With ambulation, pt was satting between 90-95% on RA. He is feeling much better today. Pt also sounds significantly improved from physical exam on admission.  -Plan to discharge today  -Prednisone 40 mg p.o. for 1 more day -Start Spiriva 1 puff twice daily on d/c   -Continue Advair 1 puff twice daily  -Continue with albuterol neb for rescue as needed -Close follow up with PCP -See discharge summary for full hospital course   FEN/GI: Regular  PPx: Lovenox  Dispo:Home today pending clinical improvement . Barriers include ability to sat well with ambulation.   Subjective:  Pt is doing well without complaints. He notes that he is breathing much better and hasn't been on oxygen overnight. Since admission, he reports that he is 80% improved from admission. Denies abdominal pain, nausea, vomiting, diarrhea, chest pain.   Objective: Temp:  [97.7 F (36.5 C)-98.3 F (36.8 C)] 97.9 F (36.6 C) (09/12 0452) Pulse Rate:  [71-83] 82 (09/12 0452) Resp:  [16-18] 16 (09/12 0452) BP: (115-135)/(72-91) 116/72 (09/12 0452) SpO2:  [91 %-97 %] 97 % (09/12 0452) Physical  Exam Constitutional:      General: He is not in acute distress.    Appearance: He is well-developed. He is not ill-appearing.  Cardiovascular:     Rate and Rhythm: Normal rate and regular rhythm.  Pulmonary:     Effort: Pulmonary effort is normal.     Comments: Mild wheezing present throughout  Abdominal:     Palpations: Abdomen is soft.     Tenderness: There is no abdominal tenderness.  Skin:    General: Skin is warm and dry.  Neurological:     Mental Status: He is alert.  Psychiatric:        Mood and Affect: Mood normal.        Behavior: Behavior normal.     Laboratory: Recent Labs  Lab 06/23/21 1405 06/23/21 1458 06/24/21 0015  WBC 7.0  --  7.0  HGB 15.7 16.3 16.0  HCT 47.4 48.0 47.8  PLT 363  --  412*   Recent Labs  Lab 06/23/21 1405 06/23/21 1458 06/24/21 0015  NA 138 140 137  K 3.9 3.8 4.1  CL 105  --  104  CO2 23  --  23  BUN 7  --  9  CREATININE 0.99  --  1.07  CALCIUM 9.4  --  10.0  GLUCOSE 111*  --  146*      Alfredo Martinez, MD 06/26/2021, 8:21 AM PGY-1, [facility] Family Medicine FPTS Intern pager: [phone], text pages welcome

## 2021-06-26 NOTE — Plan of Care (Signed)
  Problem: Education: Goal: Knowledge of General Education information will improve Description: Including pain rating scale, medication(s)/side effects and non-pharmacologic comfort measures Outcome: Completed/Met

## 2021-06-26 NOTE — Progress Notes (Signed)
SATURATION QUALIFICATIONS: (This note is used to comply with regulatory documentation for home oxygen)  Patient Saturations on Room Air at Rest = 92-93%  Patient Saturations on Room Air while Ambulating = 92%

## 2021-06-27 ENCOUNTER — Telehealth: Payer: Self-pay

## 2021-06-27 NOTE — Telephone Encounter (Signed)
Transition Care Management Unsuccessful Follow-up Telephone Call  Date of discharge and from where:  06/26/2021-[place]   Attempts:  1st Attempt  Reason for unsuccessful TCM follow-up call:  Unable to reach patient

## 2021-06-27 NOTE — Telephone Encounter (Signed)
Transition Care Management Unsuccessful Follow-up Telephone Call  Date of discharge and from where:  06/26/2021, [facility]  Attempts:  1st Attempt  Reason for unsuccessful TCM follow-up call:  Unable to leave message voicemail not set up # [phone].

## 2021-06-28 ENCOUNTER — Telehealth: Payer: Self-pay

## 2021-06-28 NOTE — Telephone Encounter (Signed)
Transition Care Management Unsuccessful Follow-up Telephone Call  Date of discharge and from where:  9/12/202, [facility]   Attempts:  2nd Attempt  Reason for unsuccessful TCM follow-up call:  Unable to leave message, voicemail not set up  # [phone].

## 2021-06-29 ENCOUNTER — Telehealth: Payer: Self-pay

## 2021-06-29 NOTE — Telephone Encounter (Signed)
Transition Care Management Unsuccessful Follow-up Telephone Call  Date of discharge and from where:  06/26/2021, [facility]   Attempts:  3rd Attempt  Reason for unsuccessful TCM follow-up call:  Unable to leave message, voicemail not set up # [phone]   He has an appointment with Ms Meredeth Ide, NP @ [facility]- 07/14/2021.

## 2021-07-14 ENCOUNTER — Ambulatory Visit: Payer: Medicaid Other | Attending: Nurse Practitioner | Admitting: Nurse Practitioner

## 2021-07-14 ENCOUNTER — Encounter: Payer: Self-pay | Admitting: Nurse Practitioner

## 2021-07-14 ENCOUNTER — Other Ambulatory Visit: Payer: Self-pay

## 2021-07-14 VITALS — BP 135/80 | HR 90 | Resp 16 | Wt 199.2 lb

## 2021-07-14 DIAGNOSIS — Z79899 Other long term (current) drug therapy: Secondary | ICD-10-CM | POA: Diagnosis not present

## 2021-07-14 DIAGNOSIS — J4541 Moderate persistent asthma with (acute) exacerbation: Secondary | ICD-10-CM | POA: Insufficient documentation

## 2021-07-14 MED ORDER — MOMETASONE FURO-FORMOTEROL FUM 100-5 MCG/ACT IN AERO: 2.0000 | INHALATION_SPRAY | Freq: Two times a day (BID) | RESPIRATORY_TRACT | 6 refills | Status: DC

## 2021-07-14 NOTE — Progress Notes (Signed)
Assessment & Plan:  Hector Gross was seen today for asthma.  Diagnoses and all orders for this visit:  Moderate persistent chronic asthma with acute exacerbation -     mometasone-formoterol (DULERA) 100-5 MCG/ACT AERO; Inhale 2 puffs into the lungs 2 (two) times daily. Continue all other medications as prescribed.   Patient has been counseled on age-appropriate routine health concerns for screening and prevention. These are reviewed and up-to-date. Referrals have been placed accordingly. Immunizations are up-to-date or declined.    Subjective:   Chief Complaint  Patient presents with   Asthma   Asthma There is no cough or shortness of breath. Pertinent negatives include no chest pain, fever, headaches, heartburn, malaise/fatigue, myalgias or weight loss. His past medical history is significant for asthma.  Hector Gross 20 y.o. male presents to office today for follow up to asthma.   Asthma He was admitted to the hospital September 9 and discharged on September 12 for asthma exacerbation due to upper respiratory illness.  At that time he did report adherence using his Advair twice a day for asthma control, albuterol nebulizers for rescue and montelukast daily.  In the hospital he was treated with prednisone and nebulizers.  He was discharged home with prednisone p.o. and instructed to start Spiriva 1 puff twice daily and continue Advair 1 puff twice daily. Today he reports his asthma symptoms are controlled however he only has his ProAir inhaler with him today.  States he has not been using his Advair inhaler.  It looks like insurance does not cover Advair and I have switched him to [facility] today he is aware that the [facility] is for every day use and his rescue inhaler is only for rescue or backup.  He does have a spacer at home and declines prescription for this today.  He continues taking montelukast 10 mg at bedtime as instructed.  Review of Systems  Constitutional:  Negative for fever,  malaise/fatigue and weight loss.  HENT: Negative.  Negative for nosebleeds.   Eyes: Negative.  Negative for blurred vision, double vision and photophobia.  Respiratory: Negative.  Negative for cough and shortness of breath.   Cardiovascular: Negative.  Negative for chest pain, palpitations and leg swelling.  Gastrointestinal: Negative.  Negative for heartburn, nausea and vomiting.  Musculoskeletal: Negative.  Negative for myalgias.  Neurological: Negative.  Negative for dizziness, focal weakness, seizures and headaches.  Psychiatric/Behavioral: Negative.  Negative for suicidal ideas.    Past Medical History:  Diagnosis Date   Asthma     No past surgical history on file.  No family history on file.  Social History Reviewed with no changes to be made today.   Outpatient Medications Prior to Visit  Medication Sig Dispense Refill   Acetaminophen (TYLENOL PO) Take 2 tablets by mouth every 6 (six) hours as needed (headache/pain).     albuterol (PROVENTIL) (2.5 MG/3ML) 0.083% nebulizer solution Take 3 mLs (2.5 mg total) by nebulization every 6 (six) hours as needed for wheezing or shortness of breath. 150 mL 1   albuterol (VENTOLIN HFA) 108 (90 Base) MCG/ACT inhaler Inhale 2 puffs into the lungs every 6 (six) hours as needed for wheezing or shortness of breath. 8 g 2   montelukast (SINGULAIR) 10 MG tablet Take 1 tablet (10 mg total) by mouth at bedtime. 90 tablet 1   Respiratory Therapy Supplies (NEBULIZER MASK ADULT) MISC 1 Device by Does not apply route daily as needed (for cough, wheezing or shortness of breath). J45.41 1 each PRN  Respiratory Therapy Supplies (NEBULIZER) DEVI 1 Device by Does not apply route daily as needed (for cough, wheezing or shortness of breath). J45.41 1 each PRN   fluticasone-salmeterol (ADVAIR HFA) 115-21 MCG/ACT inhaler Inhale 2 puffs into the lungs 2 (two) times daily. (Patient taking differently: Inhale 2 puffs into the lungs 2 (two) times daily as needed  (shortness of breath/wheezing).) 1 each 12   tiotropium (SPIRIVA HANDIHALER) 18 MCG inhalation capsule Place 1 capsule (18 mcg total) into inhaler and inhale daily. 30 capsule 2   No facility-administered medications prior to visit.    No Known Allergies     Objective:    BP 135/80   Pulse 90   Resp 16   Wt 199 lb 3.2 oz (90.4 kg)   SpO2 94%   BMI 28.58 kg/m  Wt Readings from Last 3 Encounters:  07/14/21 199 lb 3.2 oz (90.4 kg)  06/23/21 220 lb (99.8 kg)  04/23/21 220 lb (99.8 kg)    Physical Exam Vitals and nursing note reviewed.  Constitutional:      Appearance: He is well-developed.  HENT:     Head: Normocephalic and atraumatic.  Cardiovascular:     Rate and Rhythm: Normal rate and regular rhythm.     Heart sounds: Normal heart sounds. No murmur heard.   No friction rub. No gallop.  Pulmonary:     Effort: Pulmonary effort is normal. No tachypnea or respiratory distress.     Breath sounds: Normal breath sounds. No decreased breath sounds, wheezing, rhonchi or rales.  Chest:     Chest wall: No tenderness.  Abdominal:     General: Bowel sounds are normal.     Palpations: Abdomen is soft.  Musculoskeletal:        General: Normal range of motion.     Cervical back: Normal range of motion.  Skin:    General: Skin is warm and dry.  Neurological:     Mental Status: He is alert and oriented to person, place, and time.     Coordination: Coordination normal.  Psychiatric:        Behavior: Behavior normal. Behavior is cooperative.        Thought Content: Thought content normal.        Judgment: Judgment normal.         Patient has been counseled extensively about nutrition and exercise as well as the importance of adherence with medications and regular follow-up. The patient was given clear instructions to go to ER or return to medical center if symptoms don't improve, worsen or new problems develop. The patient verbalized understanding.   Follow-up: Return if  symptoms worsen or fail to improve.   [name], FNP-BC [facility] [place], [place] [phone]   07/14/2021, 3:14 PM

## 2021-11-28 ENCOUNTER — Other Ambulatory Visit: Payer: Self-pay | Admitting: Nurse Practitioner

## 2021-11-28 ENCOUNTER — Encounter: Payer: Self-pay | Admitting: Nurse Practitioner

## 2021-11-28 DIAGNOSIS — L2089 Other atopic dermatitis: Secondary | ICD-10-CM

## 2021-11-28 MED ORDER — TRIAMCINOLONE ACETONIDE 0.1 % EX OINT: 1.0000 "application " | TOPICAL_OINTMENT | Freq: Two times a day (BID) | CUTANEOUS | 0 refills | Status: DC | PRN

## 2022-04-01 ENCOUNTER — Other Ambulatory Visit: Payer: Self-pay | Admitting: Nurse Practitioner

## 2022-04-01 DIAGNOSIS — L2089 Other atopic dermatitis: Secondary | ICD-10-CM

## 2022-04-02 ENCOUNTER — Other Ambulatory Visit: Payer: Self-pay | Admitting: Nurse Practitioner

## 2022-04-02 DIAGNOSIS — L2089 Other atopic dermatitis: Secondary | ICD-10-CM

## 2022-04-02 MED ORDER — TRIAMCINOLONE ACETONIDE 0.1 % EX OINT: 1.0000 | TOPICAL_OINTMENT | Freq: Two times a day (BID) | CUTANEOUS | 0 refills | Status: DC | PRN

## 2022-04-13 ENCOUNTER — Encounter (HOSPITAL_COMMUNITY): Payer: Self-pay | Admitting: *Deleted

## 2022-04-13 ENCOUNTER — Ambulatory Visit (HOSPITAL_COMMUNITY)
Admission: EM | Admit: 2022-04-13 | Discharge: 2022-04-13 | Disposition: A | Discharge status: Home / Self Care | Payer: Medicaid Other

## 2022-04-13 ENCOUNTER — Other Ambulatory Visit: Payer: Self-pay

## 2022-04-13 DIAGNOSIS — Z0289 Encounter for other administrative examinations: Secondary | ICD-10-CM

## 2022-04-13 NOTE — ED Triage Notes (Signed)
Pt reports ABD pain started this morning .

## 2022-04-13 NOTE — ED Provider Notes (Signed)
[facility]    CSN: [id number] Arrival date & time: 04/13/22  1131      History   Chief Complaint Chief Complaint  Patient presents with   Abdominal Pain    HPI Hector Gross is a 21 y.o. male presenting for work note.  He states that he had a transient episode of epigastric pain this morning, this lasted about 2 hours and terminated on its own.  It was not associated with nausea, vomiting, diarrhea.  Last bowel movement was 1 day ago and he is still passing gas, this is normal for him.  Has not attempted any medications for the symptoms.  HPI  Past Medical History:  Diagnosis Date   Asthma     Patient Active Problem List   Diagnosis Date Noted   Acute asthma exacerbation 06/24/2021   Asthma exacerbation 06/23/2021   Uncomplicated asthma    Asthma, chronic, unspecified asthma severity, with acute exacerbation 04/23/2021   Chronic asthma with acute exacerbation 04/23/2021    History reviewed. No pertinent surgical history.     Home Medications    Prior to Admission medications   Medication Sig Start Date End Date Taking? Authorizing Provider  Acetaminophen (TYLENOL PO) Take 2 tablets by mouth every 6 (six) hours as needed (headache/pain).    [provider]  albuterol (PROVENTIL) (2.5 MG/3ML) 0.083% nebulizer solution Take 3 mLs (2.5 mg total) by nebulization every 6 (six) hours as needed for wheezing or shortness of breath. 06/24/21   Shirlean Mylar, MD  albuterol (VENTOLIN HFA) 108 (90 Base) MCG/ACT inhaler Inhale 2 puffs into the lungs every 6 (six) hours as needed for wheezing or shortness of breath. 06/24/21   Shirlean Mylar, MD  mometasone-formoterol [facility]) 100-5 MCG/ACT AERO Inhale 2 puffs into the lungs 2 (two) times daily. 07/14/21   [name], NP  montelukast (SINGULAIR) 10 MG tablet Take 1 tablet (10 mg total) by mouth at bedtime. 06/07/21 09/05/21  [name], NP  Respiratory Therapy Supplies (NEBULIZER MASK ADULT) MISC  1 Device by Does not apply route daily as needed (for cough, wheezing or shortness of breath). J45.41 06/07/21   [name], NP  Respiratory Therapy Supplies (NEBULIZER) DEVI 1 Device by Does not apply route daily as needed (for cough, wheezing or shortness of breath). J45.41 06/07/21   [name], NP  triamcinolone ointment (KENALOG) 0.1 % Apply 1 Application topically 2 (two) times daily as needed. 04/02/22   [name], NP    Family History History reviewed. No pertinent family history.  Social History Social History   Tobacco Use   Smoking status: Never   Smokeless tobacco: Never  Vaping Use   Vaping Use: Never used  Substance Use Topics   Alcohol use: No   Drug use: No     Allergies   Patient has no known allergies.   Review of Systems Review of Systems  Gastrointestinal:  Negative for abdominal pain, constipation, diarrhea, nausea and vomiting.  All other systems reviewed and are negative.    Physical Exam Triage Vital Signs ED Triage Vitals [04/13/22 1213]  Enc Vitals Group     BP 118/77     Pulse Rate 64     Resp 18     Temp 97.7 F (36.5 C)     Temp src      SpO2 100 %     Weight      Height      Head Circumference  Peak Flow      Pain Score 4     Pain Loc      Pain Edu?      Excl. in GC?    No data found.  Updated Vital Signs BP 118/77   Pulse 64   Temp 97.7 F (36.5 C)   Resp 18   SpO2 100%   Visual Acuity Right Eye Distance:   Left Eye Distance:   Bilateral Distance:    Right Eye Near:   Left Eye Near:    Bilateral Near:     Physical Exam Vitals reviewed.  Constitutional:      General: He is not in acute distress.    Appearance: Normal appearance. He is not ill-appearing.     Comments: Resting comfortably   HENT:     Head: Normocephalic and atraumatic.     Mouth/Throat:     Mouth: Mucous membranes are moist.     Comments: Moist mucous membranes Eyes:     Extraocular Movements: Extraocular movements  intact.     Pupils: Pupils are equal, round, and reactive to light.  Cardiovascular:     Rate and Rhythm: Normal rate and regular rhythm.     Heart sounds: Normal heart sounds.  Pulmonary:     Effort: Pulmonary effort is normal.     Breath sounds: Normal breath sounds. No wheezing, rhonchi or rales.  Abdominal:     General: Bowel sounds are normal. There is no distension.     Palpations: Abdomen is soft. There is no mass.     Tenderness: There is no abdominal tenderness. There is no right CVA tenderness, left CVA tenderness, guarding or rebound.     Comments: No pain   Skin:    General: Skin is warm.     Capillary Refill: Capillary refill takes less than 2 seconds.     Comments: Good skin turgor  Neurological:     General: No focal deficit present.     Mental Status: He is alert and oriented to person, place, and time.  Psychiatric:        Mood and Affect: Mood normal.        Behavior: Behavior normal.      UC Treatments / Results  Labs (all labs ordered are listed, but only abnormal results are displayed) Labs Reviewed - No data to display  EKG   Radiology No results found.  Procedures Procedures (including critical care time)  Medications Ordered in UC Medications - No data to display  Initial Impression / Assessment and Plan / UC Course  I have reviewed the triage vital signs and the nursing notes.  Pertinent labs & imaging results that were available during my care of the patient were reviewed by me and considered in my medical decision making (see chart for details).     This patient is a very pleasant 21 y.o. year old male presenting with epigastric pain- resolved at time of visit, requesting work note. Afebrile, nontachy. There is no pain on exam. Work note provided. ED return precautions discussed. Patient verbalizes understanding and agreement.  Final Clinical Impressions(s) / UC Diagnoses   Final diagnoses:  Encounter to obtain excuse from work    Discharge Instructions   None    ED Prescriptions   None    PDMP not reviewed this encounter.   Rhys Martini, PA-C 04/13/22 1245

## 2022-04-25 ENCOUNTER — Other Ambulatory Visit: Payer: Self-pay | Admitting: Internal Medicine

## 2022-04-25 MED ORDER — METRONIDAZOLE 500 MG PO TABS: 500.0000 mg | ORAL_TABLET | Freq: Two times a day (BID) | ORAL | 0 refills | Status: AC

## 2022-04-25 NOTE — Progress Notes (Signed)
Rx for flagyl x7 days for partner treatment for trichomoniasis

## 2022-05-12 ENCOUNTER — Encounter: Payer: Self-pay | Admitting: Nurse Practitioner

## 2022-05-13 ENCOUNTER — Other Ambulatory Visit: Payer: Self-pay | Admitting: Nurse Practitioner

## 2022-05-13 DIAGNOSIS — J4541 Moderate persistent asthma with (acute) exacerbation: Secondary | ICD-10-CM

## 2022-05-13 MED ORDER — ALBUTEROL SULFATE (2.5 MG/3ML) 0.083% IN NEBU: 2.5000 mg | INHALATION_SOLUTION | Freq: Four times a day (QID) | RESPIRATORY_TRACT | 1 refills | Status: DC | PRN

## 2022-05-13 MED ORDER — MOMETASONE FURO-FORMOTEROL FUM 100-5 MCG/ACT IN AERO: 2.0000 | INHALATION_SPRAY | Freq: Two times a day (BID) | RESPIRATORY_TRACT | 6 refills | Status: DC

## 2022-05-13 MED ORDER — SPIRIVA HANDIHALER 18 MCG IN CAPS: 18.0000 ug | ORAL_CAPSULE | Freq: Every day | RESPIRATORY_TRACT | 12 refills | Status: DC

## 2022-05-13 MED ORDER — VENTOLIN HFA 108 (90 BASE) MCG/ACT IN AERS: 1.0000 | INHALATION_SPRAY | Freq: Four times a day (QID) | RESPIRATORY_TRACT | 2 refills | Status: DC | PRN

## 2022-05-13 MED ORDER — MONTELUKAST SODIUM 10 MG PO TABS: 10.0000 mg | ORAL_TABLET | Freq: Every day | ORAL | 1 refills | Status: DC

## 2022-05-15 ENCOUNTER — Encounter: Payer: Medicaid Other | Admitting: Nurse Practitioner

## 2022-05-15 DIAGNOSIS — J4541 Moderate persistent asthma with (acute) exacerbation: Secondary | ICD-10-CM

## 2022-05-15 NOTE — Progress Notes (Signed)
No vm set up. 

## 2022-06-24 ENCOUNTER — Other Ambulatory Visit: Payer: Self-pay | Admitting: Nurse Practitioner

## 2022-07-26 ENCOUNTER — Other Ambulatory Visit: Payer: Self-pay | Admitting: Nurse Practitioner

## 2022-07-26 DIAGNOSIS — J4541 Moderate persistent asthma with (acute) exacerbation: Secondary | ICD-10-CM

## 2022-10-05 DIAGNOSIS — Z5321 Procedure and treatment not carried out due to patient leaving prior to being seen by health care provider: Secondary | ICD-10-CM | POA: Diagnosis not present

## 2022-10-06 ENCOUNTER — Emergency Department (HOSPITAL_COMMUNITY)
Admission: EM | Admit: 2022-10-06 | Discharge: 2022-10-06 | Discharge status: Against Medical Advice | Payer: Medicaid Other | Attending: Emergency Medicine | Admitting: Emergency Medicine

## 2022-10-06 ENCOUNTER — Ambulatory Visit (HOSPITAL_COMMUNITY)
Admission: EM | Admit: 2022-10-06 | Discharge: 2022-10-06 | Disposition: A | Discharge status: Home / Self Care | Payer: Medicaid Other | Attending: Emergency Medicine | Admitting: Emergency Medicine

## 2022-10-06 ENCOUNTER — Encounter (HOSPITAL_COMMUNITY): Payer: Self-pay | Admitting: Emergency Medicine

## 2022-10-06 DIAGNOSIS — J069 Acute upper respiratory infection, unspecified: Secondary | ICD-10-CM

## 2022-10-06 DIAGNOSIS — K0889 Other specified disorders of teeth and supporting structures: Secondary | ICD-10-CM

## 2022-10-06 MED ORDER — AMOXICILLIN-POT CLAVULANATE 875-125 MG PO TABS: 1.0000 | ORAL_TABLET | Freq: Two times a day (BID) | ORAL | 0 refills | Status: DC

## 2022-10-06 NOTE — ED Notes (Signed)
Pt called for triage x3, no response.  

## 2022-10-06 NOTE — ED Triage Notes (Signed)
Chills, runny nose, cough, chills since this morning. Tooth ache x 2 days.  Denies fever, sore throat, body aches, headache, N/V/D.  Took tylenol and goody powder without much improvement.

## 2022-10-06 NOTE — ED Provider Notes (Signed)
[facility]    CSN: [id number] Arrival date & time: 10/06/22  1010      History   Chief Complaint Chief Complaint  Patient presents with   URI   Dental Pain    HPI Hector Gross is a 21 y.o. male.   Patient presents for evaluation of left upper dental pain beginning 2 days ago.  Symptoms occur intermittently.  Pain radiating to the left ear.  Has been able to tolerate food and liquids.  Has attempted use of Goody's powder and Tylenol which has been ineffective.  Unknown if need for dental work.    Patient presents with congestion and a nonproductive cough beginning this morning.  Known sick contacts within household.  Tolerating food and liquids.  Has attempted use of NyQuil which has been somewhat helpful.  Denies fever, chills, body aches, sore throat, shortness of breath or wheezing.  History of asthma.   Past Medical History:  Diagnosis Date   Asthma     Patient Active Problem List   Diagnosis Date Noted   Acute asthma exacerbation 06/24/2021   Asthma exacerbation [id number]   Uncomplicated asthma    Asthma, chronic, unspecified asthma severity, with acute exacerbation 04/23/2021   Chronic asthma with acute exacerbation 04/23/2021    History reviewed. No pertinent surgical history.     Home Medications    Prior to Admission medications   Medication Sig Start Date End Date Taking? Authorizing Provider  albuterol (VENTOLIN HFA) 108 (90 Base) MCG/ACT inhaler Inhale 1-2 puffs into the lungs every 6 (six) hours as needed for wheezing or shortness of breath. 05/13/22   Gildardo Pounds, NP  mometasone-formoterol (DULERA) 100-5 MCG/ACT AERO Inhale 2 puffs into the lungs 2 (two) times daily. 05/13/22  Yes Gildardo Pounds, NP  tiotropium (SPIRIVA HANDIHALER) 18 MCG inhalation capsule Place 1 capsule (18 mcg total) into inhaler and inhale daily. 05/13/22   Gildardo Pounds, NP  Acetaminophen (TYLENOL PO) Take 2 tablets by mouth every 6 (six) hours as needed  (headache/pain).    [provider]  albuterol (PROVENTIL) (2.5 MG/3ML) 0.083% nebulizer solution Take 3 mLs (2.5 mg total) by nebulization every 6 (six) hours as needed for wheezing or shortness of breath. 05/13/22   Gildardo Pounds, NP  montelukast (SINGULAIR) 10 MG tablet Take 1 tablet (10 mg total) by mouth at bedtime. 05/13/22   Gildardo Pounds, NP  Respiratory Therapy Supplies (NEBULIZER MASK ADULT) MISC 1 Device by Does not apply route daily as needed (for cough, wheezing or shortness of breath). J45.41 06/07/21   Gildardo Pounds, NP  Respiratory Therapy Supplies (NEBULIZER) DEVI 1 Device by Does not apply route daily as needed (for cough, wheezing or shortness of breath). J45.41 06/07/21   Gildardo Pounds, NP  triamcinolone ointment (KENALOG) 0.1 % Apply 1 Application topically 2 (two) times daily as needed. 04/02/22   Gildardo Pounds, NP    Family History History reviewed. No pertinent family history.  Social History Social History   Tobacco Use   Smoking status: Never   Smokeless tobacco: Never  Vaping Use   Vaping Use: Never used  Substance Use Topics   Alcohol use: No   Drug use: No     Allergies   Patient has no known allergies.   Review of Systems Review of Systems  Constitutional: Negative.   HENT:  Positive for congestion, dental problem and rhinorrhea. Negative for drooling, ear discharge, ear pain, facial swelling, hearing loss, mouth  sores, nosebleeds, postnasal drip, sinus pressure, sinus pain, sneezing, sore throat, tinnitus, trouble swallowing and voice change.   Respiratory:  Positive for cough. Negative for apnea, choking, chest tightness, shortness of breath, wheezing and stridor.   Cardiovascular: Negative.   Gastrointestinal: Negative.   Skin: Negative.   Neurological: Negative.      Physical Exam Triage Vital Signs ED Triage Vitals [10/06/22 1052]  Enc Vitals Group     BP 119/79     Pulse Rate 78     Resp 16     Temp 97.8 F (36.6  C)     Temp Source Oral     SpO2 97 %     Weight      Height      Head Circumference      Peak Flow      Pain Score 6     Pain Loc      Pain Edu?      Excl. in GC?    No data found.  Updated Vital Signs BP 119/79 (BP Location: Right Arm)   Pulse 78   Temp 97.8 F (36.6 C) (Oral)   Resp 16   SpO2 97%   Visual Acuity Right Eye Distance:   Left Eye Distance:   Bilateral Distance:    Right Eye Near:   Left Eye Near:    Bilateral Near:     Physical Exam Constitutional:      Appearance: Normal appearance.  HENT:     Head: Normocephalic.     Right Ear: Tympanic membrane, ear canal and external ear normal.     Left Ear: Tympanic membrane, ear canal and external ear normal.     Nose: Congestion and rhinorrhea present.     Mouth/Throat:     Comments: Mild to moderate gingival swelling present along the left upper gumline, no presence of dental decay or abscess noted, pharynx is clear without obstruction, erythema or exudate Cardiovascular:     Rate and Rhythm: Normal rate and regular rhythm.     Pulses: Normal pulses.     Heart sounds: Normal heart sounds.  Pulmonary:     Effort: Pulmonary effort is normal.     Breath sounds: Normal breath sounds.  Musculoskeletal:     Cervical back: Normal range of motion.  Skin:    General: Skin is warm and dry.  Neurological:     Mental Status: He is alert and oriented to person, place, and time. Mental status is at baseline.  Psychiatric:        Mood and Affect: Mood normal.        Behavior: Behavior normal.      UC Treatments / Results  Labs (all labs ordered are listed, but only abnormal results are displayed) Labs Reviewed - No data to display  EKG   Radiology No results found.  Procedures Procedures (including critical care time)  Medications Ordered in UC Medications - No data to display  Initial Impression / Assessment and Plan / UC Course  I have reviewed the triage vital signs and the nursing  notes.  Pertinent labs & imaging results that were available during my care of the patient were reviewed by me and considered in my medical decision making (see chart for details).  Dental pain, viral URI with cough  Gingival swelling noted on exam therefore bacterial coverage provided Tessalon prescribed and discussed administration, recommended additional supportive measures and advised follow-up with dentist for reevaluation as soon as possible  Etiology is  most likely viral, vitals are stable patient is in no signs of distress nontoxic-appearing, his lungs are clear to auscultation and O2 saturation 97% on room air, low suspicion for asthma exacerbation at this time, declined prescription cough medicine, recommended over-the-counter medication that may be used as Gross alternative, may follow-up with this urgent care as needed, work note given  Final Clinical Impressions(s) / UC Diagnoses   Final diagnoses:  None   Discharge Instructions   None    ED Prescriptions   None    PDMP not reviewed this encounter.   Valinda Hoar, NP 10/06/22 1128

## 2022-10-06 NOTE — Discharge Instructions (Signed)
Your symptoms today are most likely being caused by a virus and should steadily improve in time it can take up to 7 to 10 days before you truly start to see a turnaround however things will get better    You can take Tylenol and/or Ibuprofen as needed for fever reduction and pain relief.   For cough: honey 1/2 to 1 teaspoon (you can dilute the honey in water or another fluid).  You can also use guaifenesin and dextromethorphan for cough. You can use a humidifier for chest congestion and cough.  If you don't have a humidifier, you can sit in the bathroom with the hot shower running.      For sore throat: try warm salt water gargles, cepacol lozenges, throat spray, warm tea or water with lemon/honey, popsicles or ice, or OTC cold relief medicine for throat discomfort.   For congestion: take a daily anti-histamine like Zyrtec, Claritin, and a oral decongestant, such as pseudoephedrine.  You can also use Flonase 1-2 sprays in each nostril daily.   It is important to stay hydrated: drink plenty of fluids (water, gatorade/powerade/pedialyte, juices, or teas) to keep your throat moisturized and help further relieve irritation/discomfort.    Your teeth -On exam there is swelling around your gumline therefore we will provide coverage for germs that may be causing this - Take Augmentin every morning every evening for 7 days, ideally you will see improvement in about 2 days Progression from there - You may use salt water gargles, Listerine gargles, soft foods, warm or cold liquids to preference for additional comfort -Eventually you will need to follow-up with a dentist for evaluation of your symptoms will most likely return at some point

## 2022-10-07 DIAGNOSIS — R42 Dizziness and giddiness: Secondary | ICD-10-CM | POA: Diagnosis not present

## 2022-10-07 DIAGNOSIS — R531 Weakness: Secondary | ICD-10-CM | POA: Diagnosis not present

## 2022-10-07 DIAGNOSIS — I499 Cardiac arrhythmia, unspecified: Secondary | ICD-10-CM | POA: Diagnosis not present

## 2022-10-08 ENCOUNTER — Encounter (HOSPITAL_COMMUNITY): Payer: Self-pay

## 2022-10-08 ENCOUNTER — Emergency Department (HOSPITAL_COMMUNITY)
Admission: EM | Admit: 2022-10-08 | Discharge: 2022-10-08 | Disposition: A | Discharge status: Home / Self Care | Payer: Medicaid Other | Attending: Emergency Medicine | Admitting: Emergency Medicine

## 2022-10-08 DIAGNOSIS — K0889 Other specified disorders of teeth and supporting structures: Secondary | ICD-10-CM | POA: Diagnosis not present

## 2022-10-08 MED ORDER — LIDOCAINE VISCOUS HCL 2 % MT SOLN: 15.0000 mL | OROMUCOSAL | 0 refills | Status: DC | PRN

## 2022-10-08 NOTE — Discharge Instructions (Addendum)
Thank you for allowing me to be part of your care today.  I have sent a prescription for viscous lidocaine that you can swish with to help with gum related pain.  I also recommend alternating Tylenol 500-1000mg  with ibuprofen 600-800 mg every 3 - 4 hours.  You can use cool compresses to the side of your face if you find this beneficial.  Do not take more than 4000 mg of Tylenol in a 24-hour period, do not take more than 3200 mg of ibuprofen in a 24-hour period.  I have attached a resource guide to help you find a dentist in the area.  Please schedule an appointment as soon as possible for a visit.

## 2022-10-08 NOTE — ED Provider Notes (Signed)
[facility] EMERGENCY DEPARTMENT Provider Note   CSN: [id number] Arrival date & time: 10/08/22  1558     History  Chief Complaint  Patient presents with   Dental Pain    Hector Gross is a 21 y.o. male presents to the ED complaining of left upper dental pain that has been going on for about a week.  Patient was seen in urgent care for same issue and was prescribed Augmentin.  He has been taking 3 days worth of Augmentin without relief.  He states that he feels pain radiating up into his jaw.  Patient has not yet had a chance to be evaluated by a dentist.  He has been taking Tylenol at home with little relief.  Denies fever, chills, difficulty swallowing, otalgia, voice changes.        Home Medications Prior to Admission medications   Medication Sig Start Date End Date Taking? Authorizing Provider  albuterol (VENTOLIN HFA) 108 (90 Base) MCG/ACT inhaler Inhale 1-2 puffs into the lungs every 6 (six) hours as needed for wheezing or shortness of breath. 05/13/22   [name], NP  lidocaine (XYLOCAINE) 2 % solution Use as directed 15 mLs in the mouth or throat as needed for mouth pain. 10/08/22  Yes Ayvion Kavanagh R, PA  tiotropium (SPIRIVA HANDIHALER) 18 MCG inhalation capsule Place 1 capsule (18 mcg total) into inhaler and inhale daily. 05/13/22   [name], NP  Acetaminophen (TYLENOL PO) Take 2 tablets by mouth every 6 (six) hours as needed (headache/pain).    [provider]  albuterol (PROVENTIL) (2.5 MG/3ML) 0.083% nebulizer solution Take 3 mLs (2.5 mg total) by nebulization every 6 (six) hours as needed for wheezing or shortness of breath. 05/13/22   [name], NP  amoxicillin-clavulanate (AUGMENTIN) 875-125 MG tablet Take 1 tablet by mouth every 12 (twelve) hours. 10/06/22   White, Elita Boone, NP  mometasone-formoterol (DULERA) 100-5 MCG/ACT AERO Inhale 2 puffs into the lungs 2 (two) times daily. 05/13/22   [name], NP   montelukast (SINGULAIR) 10 MG tablet Take 1 tablet (10 mg total) by mouth at bedtime. 05/13/22   [name], NP  Respiratory Therapy Supplies (NEBULIZER MASK ADULT) MISC 1 Device by Does not apply route daily as needed (for cough, wheezing or shortness of breath). J45.41 06/07/21   [name], NP  Respiratory Therapy Supplies (NEBULIZER) DEVI 1 Device by Does not apply route daily as needed (for cough, wheezing or shortness of breath). J45.41 06/07/21   [name], NP  triamcinolone ointment (KENALOG) 0.1 % Apply 1 Application topically 2 (two) times daily as needed. 04/02/22   [name], NP      Allergies    Patient has no known allergies.    Review of Systems   Review of Systems  Constitutional:  Negative for fever.  HENT:  Negative for ear pain, facial swelling, mouth sores, sinus pressure, sinus pain, trouble swallowing and voice change.        Dental pain, upper left  Neurological:  Negative for headaches.    Physical Exam Updated Vital Signs BP 134/75 (BP Location: Right Arm)   Pulse 78   Temp 98 F (36.7 C) (Oral)   Resp 16   SpO2 97%  Physical Exam Vitals and nursing note reviewed.  Constitutional:      General: He is not in acute distress.    Appearance: Normal appearance. He is not ill-appearing or diaphoretic.  HENT:  Head: Normocephalic and atraumatic.     Comments: No obvious swelling to the face, no tenderness to palpation of maxillary bones    Left Ear: No mastoid tenderness.     Ears:     Comments: There is no mastoid tenderness to palpation    Nose: No congestion or rhinorrhea.     Mouth/Throat:     Lips: Pink.     Mouth: Mucous membranes are moist. No oral lesions.     Dentition: Dental tenderness present. No gingival swelling, dental caries, dental abscesses or gum lesions.     Comments: Patient has tenderness to palpation of tooth #15 and the site where tooth #16 would be.  Patient does still have his wisdom teeth but they are  impacted.  Gingiva is pink, not erythematous, no exudate, no drainage, no bleeding.  There is no evidence of periapical abscess or other infection.  There are no dental caries or broken dentition. Cardiovascular:     Rate and Rhythm: Normal rate and regular rhythm.  Pulmonary:     Effort: Pulmonary effort is normal.  Neurological:     Mental Status: He is alert. Mental status is at baseline.  Psychiatric:        Mood and Affect: Mood normal.        Behavior: Behavior normal.     ED Results / Procedures / Treatments   Labs (all labs ordered are listed, but only abnormal results are displayed) Labs Reviewed - No data to display  EKG None  Radiology No results found.  Procedures Procedures    Medications Ordered in ED Medications - No data to display  ED Course/ Medical Decision Making/ A&P                           Medical Decision Making Risk Prescription drug management.   This patient presents to the ED with chief complaint(s) of dental pain on the upper left that has been persisting for the past week.  Patient was evaluated in urgent care and given antibiotic but has not improved despite 72 hours of antibiotic treatment.    The differential diagnosis includes dental infection, periapical abscess, periodontal abscess, broken dentition, dental fracture, dental avulsion, pulpitis; suspect liquids angina given location of patient's pain, lack of submandibular swelling, trouble swallowing, or voice change  The initial plan is to assess patient  Additional history obtained: Additional history obtained from  girlfriend Records reviewed  urgent care records  Initial Assessment:   On exam, patient's mouth is moist without obvious oral lesions.  There is no erythema, swelling, or bleeding around dentition.  There are no obvious dental caries or broken dentition.  No gingival swelling on the upper left for patient is experiencing pain.  Mild tenderness to palpation of tooth  #15 in the area where tooth #16 would be.  Patient does still have his wisdom teeth, suspect that they are impacted.  No tenderness to palpation of mastoid process or maxillofacial bones.  Independent ECG/labs interpretation:  The following labs were independently interpreted:  Not indicated  Independent visualization and interpretation of imaging: I independently visualized the following imaging with scope of interpretation limited to determining acute life threatening conditions related to emergency care: Not indicated  Treatment and Reassessment: Not indicated  Disposition:   The patient has been appropriately medically screened and/or stabilized in the ED. I have low suspicion for any other emergent medical condition which would require further screening, evaluation or  treatment in the ED or require inpatient management. At time of discharge the patient is hemodynamically stable and in no acute distress. I have discussed work-up results and diagnosis with patient and answered all questions. Patient is agreeable with discharge plan. We discussed strict return precautions for returning to the emergency department and they verbalized understanding.  Given patient's presentation and physical exam, I am not suspicious of periodontal or periapical abscess.  He has no gingival swelling, erythema, discharge, or evidence of periapical abscess.  I suspect patient's pain is secondary to impacted wisdom teeth.  He appears to have good dentition and takes good care of his teeth.  Will provide patient with a list of dentists in the area, encouraged patient to schedule an appointment as soon as possible.  Discussed with patient supportive care measures at home including alternating Tylenol and ibuprofen.  Will prescribe patient viscous lidocaine to swish with to help with dental and gum related pain.          Final Clinical Impression(s) / ED Diagnoses Final diagnoses:  Pain, dental    Rx / DC  Orders ED Discharge Orders          Ordered    lidocaine (XYLOCAINE) 2 % solution  As needed        10/08/22 1737              Lenard Simmer, [place] 10/08/22 1749    Gloris Manchester, MD 10/08/22 2049

## 2022-10-08 NOTE — ED Triage Notes (Signed)
Pt reports left upper dental pain for about 1 week. States he has taken 3 days of amoxicillin with no relief.

## 2022-11-09 ENCOUNTER — Encounter: Payer: Self-pay | Admitting: Nurse Practitioner

## 2022-11-09 ENCOUNTER — Ambulatory Visit: Payer: Self-pay | Attending: Nurse Practitioner | Admitting: Nurse Practitioner

## 2022-11-09 ENCOUNTER — Other Ambulatory Visit: Payer: Self-pay

## 2022-11-09 VITALS — BP 115/75 | HR 76 | Ht 71.0 in | Wt 181.0 lb

## 2022-11-09 DIAGNOSIS — R7989 Other specified abnormal findings of blood chemistry: Secondary | ICD-10-CM

## 2022-11-09 DIAGNOSIS — J4541 Moderate persistent asthma with (acute) exacerbation: Secondary | ICD-10-CM

## 2022-11-09 DIAGNOSIS — R7309 Other abnormal glucose: Secondary | ICD-10-CM

## 2022-11-09 DIAGNOSIS — Z833 Family history of diabetes mellitus: Secondary | ICD-10-CM

## 2022-11-09 DIAGNOSIS — J45901 Unspecified asthma with (acute) exacerbation: Secondary | ICD-10-CM

## 2022-11-09 DIAGNOSIS — K921 Melena: Secondary | ICD-10-CM

## 2022-11-09 MED ORDER — ALBUTEROL SULFATE HFA 108 (90 BASE) MCG/ACT IN AERS: 1.0000 | INHALATION_SPRAY | Freq: Four times a day (QID) | RESPIRATORY_TRACT | 2 refills | Status: DC | PRN

## 2022-11-09 MED ORDER — MOMETASONE FURO-FORMOTEROL FUM 100-5 MCG/ACT IN AERO
2.0000 | INHALATION_SPRAY | Freq: Two times a day (BID) | RESPIRATORY_TRACT | 6 refills | Status: DC
  Filled 2022-11-09: qty 13, 30d supply, fill #0

## 2022-11-09 MED ORDER — MONTELUKAST SODIUM 10 MG PO TABS: 10.0000 mg | ORAL_TABLET | Freq: Every day | ORAL | 1 refills | Status: DC

## 2022-11-09 NOTE — Progress Notes (Unsigned)
Assessment & Plan:  Hector Gross was seen today for gi problem and medication refill.  Diagnoses and all orders for this visit:  Moderate persistent chronic asthma with acute exacerbation -     albuterol (VENTOLIN HFA) 108 (90 Base) MCG/ACT inhaler; Inhale 1-2 puffs into the lungs every 6 (six) hours as needed for wheezing or shortness of breath. -     montelukast (SINGULAIR) 10 MG tablet; Take 1 tablet (10 mg total) by mouth at bedtime. -     mometasone-formoterol (DULERA) 100-5 MCG/ACT AERO; Inhale 2 puffs into the lungs 2 (two) times daily. NEEDS PRIOR AUTH  Blood in the stool -     Fecal occult blood, imunochemical([facility])  Elevated platelet count -     CBC with Differential  Elevated glucose -     CMP14+EGFR  Family history of diabetes mellitus (DM) -     CMP14+EGFR    Patient has been counseled on age-appropriate routine health concerns for screening and prevention. These are reviewed and up-to-date. Referrals have been placed accordingly. Immunizations are up-to-date or declined.    Subjective:   Chief Complaint  Patient presents with   GI Problem   Medication Refill   HPI Hector Gross 22 y.o. male presents to office today for medication refills.  He has a PMH of asthma  Asthma Ran out of his inhalers over a week ago. He has not taken singulair in quite some time. Today he does not endorse any worsening shortness of breath, wheezing, cough or chest pain.   Notes an episode of blood in stool. Has not re occurred however we will send him home with a stool kit today. Denies any other GI symptoms.    Review of Systems  Constitutional:  Negative for fever, malaise/fatigue and weight loss.  HENT: Negative.  Negative for nosebleeds.   Eyes: Negative.  Negative for blurred vision, double vision and photophobia.  Respiratory: Negative.  Negative for cough and shortness of breath.   Cardiovascular: Negative.  Negative for chest pain, palpitations and leg  swelling.  Gastrointestinal:  Positive for blood in stool. Negative for abdominal pain, constipation, diarrhea, heartburn, melena, nausea and vomiting.  Musculoskeletal: Negative.  Negative for myalgias.  Neurological: Negative.  Negative for dizziness, focal weakness, seizures and headaches.  Psychiatric/Behavioral: Negative.  Negative for suicidal ideas.     Past Medical History:  Diagnosis Date   Asthma     History reviewed. No pertinent surgical history.  Family History  Problem Relation Age of Onset   Diabetes Maternal Grandmother     Social History Reviewed with no changes to be made today.   Outpatient Medications Prior to Visit  Medication Sig Dispense Refill   Acetaminophen (TYLENOL PO) Take 2 tablets by mouth every 6 (six) hours as needed (headache/pain).     albuterol (PROVENTIL) (2.5 MG/3ML) 0.083% nebulizer solution Take 3 mLs (2.5 mg total) by nebulization every 6 (six) hours as needed for wheezing or shortness of breath. 150 mL 1   Respiratory Therapy Supplies (NEBULIZER MASK ADULT) MISC 1 Device by Does not apply route daily as needed (for cough, wheezing or shortness of breath). J45.41 1 each PRN   Respiratory Therapy Supplies (NEBULIZER) DEVI 1 Device by Does not apply route daily as needed (for cough, wheezing or shortness of breath). J45.41 1 each PRN   triamcinolone ointment (KENALOG) 0.1 % Apply 1 Application topically 2 (two) times daily as needed. 100 g 0   amoxicillin-clavulanate (AUGMENTIN) 875-125 MG tablet Take 1 tablet  by mouth every 12 (twelve) hours. 14 tablet 0   lidocaine (XYLOCAINE) 2 % solution Use as directed 15 mLs in the mouth or throat as needed for mouth pain. 100 mL 0   mometasone-formoterol (DULERA) 100-5 MCG/ACT AERO Inhale 2 puffs into the lungs 2 (two) times daily. 13 g 6   tiotropium (SPIRIVA HANDIHALER) 18 MCG inhalation capsule Place 1 capsule (18 mcg total) into inhaler and inhale daily. 30 capsule 12   albuterol (VENTOLIN HFA) 108 (90  Base) MCG/ACT inhaler Inhale 1-2 puffs into the lungs every 6 (six) hours as needed for wheezing or shortness of breath. (Patient not taking: Reported on 11/09/2022) 54 g 2   montelukast (SINGULAIR) 10 MG tablet Take 1 tablet (10 mg total) by mouth at bedtime. (Patient not taking: Reported on 11/09/2022) 90 tablet 1   No facility-administered medications prior to visit.    No Known Allergies     Objective:    BP 115/75   Pulse 76   Ht 5\' 11"  (1.803 m)   Wt 181 lb (82.1 kg)   SpO2 98%   BMI 25.24 kg/m  Wt Readings from Last 3 Encounters:  11/09/22 181 lb (82.1 kg)  07/14/21 199 lb 3.2 oz (90.4 kg)  06/23/21 220 lb (99.8 kg)    Physical Exam Vitals and nursing note reviewed.  Constitutional:      Appearance: He is well-developed.  HENT:     Head: Normocephalic and atraumatic.  Cardiovascular:     Rate and Rhythm: Normal rate and regular rhythm.     Heart sounds: Normal heart sounds. No murmur heard.    No friction rub. No gallop.  Pulmonary:     Effort: Pulmonary effort is normal. No tachypnea or respiratory distress.     Breath sounds: Normal breath sounds. No decreased breath sounds, wheezing, rhonchi or rales.  Chest:     Chest wall: No tenderness.  Abdominal:     General: Bowel sounds are normal.     Palpations: Abdomen is soft.  Musculoskeletal:        General: Normal range of motion.     Cervical back: Normal range of motion.  Skin:    General: Skin is warm and dry.  Neurological:     Mental Status: He is alert and oriented to person, place, and time.     Coordination: Coordination normal.  Psychiatric:        Behavior: Behavior normal. Behavior is cooperative.        Thought Content: Thought content normal.        Judgment: Judgment normal.          Patient has been counseled extensively about nutrition and exercise as well as the importance of adherence with medications and regular follow-up. The patient was given clear instructions to go to ER or  return to medical center if symptoms don't improve, worsen or new problems develop. The patient verbalized understanding.   Follow-up: Return if symptoms worsen or fail to improve.   Gildardo Pounds, FNP-BC [facility] and [place], [place]   11/11/2022, 9:28 PM

## 2022-11-10 LAB — CMP14+EGFR
ALT: 26 IU/L (ref 0–44)
AST: 23 IU/L (ref 0–40)
Albumin/Globulin Ratio: 1.6 (ref 1.2–2.2)
Albumin: 4.6 g/dL (ref 4.3–5.2)
Alkaline Phosphatase: 78 IU/L (ref 44–121)
BUN/Creatinine Ratio: 11 (ref 9–20)
BUN: 12 mg/dL (ref 6–20)
Bilirubin Total: 0.4 mg/dL (ref 0.0–1.2)
CO2: 21 mmol/L (ref 20–29)
Calcium: 10.1 mg/dL (ref 8.7–10.2)
Chloride: 102 mmol/L (ref 96–106)
Creatinine, Ser: 1.12 mg/dL (ref 0.76–1.27)
Globulin, Total: 2.9 g/dL (ref 1.5–4.5)
Glucose: 77 mg/dL (ref 70–99)
Potassium: 4 mmol/L (ref 3.5–5.2)
Sodium: 140 mmol/L (ref 134–144)
Total Protein: 7.5 g/dL (ref 6.0–8.5)
eGFR: 95 mL/min/{1.73_m2} (ref >59)

## 2022-11-10 LAB — CBC WITH DIFFERENTIAL/PLATELET
Basophils Absolute: 0.1 10*3/uL (ref 0.0–0.2)
Basos: 1 %
EOS (ABSOLUTE): 0.5 10*3/uL — ABNORMAL HIGH (ref 0.0–0.4)
Eos: 10 %
Hematocrit: 45 % (ref 37.5–51.0)
Hemoglobin: 15.2 g/dL (ref 13.0–17.7)
Immature Grans (Abs): 0 10*3/uL (ref 0.0–0.1)
Immature Granulocytes: 0 %
Lymphocytes Absolute: 1.2 10*3/uL (ref 0.7–3.1)
Lymphs: 27 %
MCH: 27.1 pg (ref 26.6–33.0)
MCHC: 33.8 g/dL (ref 31.5–35.7)
MCV: 80 fL (ref 79–97)
Monocytes Absolute: 0.4 10*3/uL (ref 0.1–0.9)
Monocytes: 10 %
Neutrophils Absolute: 2.3 10*3/uL (ref 1.4–7.0)
Neutrophils: 52 %
Platelets: 390 10*3/uL (ref 150–450)
RBC: 5.6 x10E6/uL (ref 4.14–5.80)
RDW: 12.3 % (ref 11.6–15.4)
WBC: 4.5 10*3/uL (ref 3.4–10.8)

## 2022-11-11 ENCOUNTER — Encounter: Payer: Self-pay | Admitting: Nurse Practitioner

## 2022-11-27 ENCOUNTER — Other Ambulatory Visit: Payer: Self-pay

## 2022-11-27 ENCOUNTER — Other Ambulatory Visit: Payer: Self-pay | Admitting: Pharmacist

## 2022-11-27 MED ORDER — FLUTICASONE FUROATE-VILANTEROL 100-25 MCG/ACT IN AEPB
1.0000 | INHALATION_SPRAY | Freq: Every day | RESPIRATORY_TRACT | 2 refills | Status: DC
  Filled 2022-11-27: qty 60, 30d supply, fill #0
  Filled 2022-11-27: qty 60, 60d supply, fill #0
  Filled 2022-12-12 – 2023-01-07 (×6): qty 60, 30d supply, fill #1

## 2022-11-30 ENCOUNTER — Other Ambulatory Visit: Payer: Self-pay

## 2022-11-30 ENCOUNTER — Telehealth: Payer: Self-pay

## 2022-11-30 NOTE — Telephone Encounter (Signed)
Patient has requested patient assistance for the [facility] inhaler. A [employer] Patient Assistance Application was received from patient but is missing information. Estimated annual or monthly income is needed. Verification is needed if no income.

## 2022-12-07 ENCOUNTER — Other Ambulatory Visit: Payer: Self-pay

## 2022-12-13 ENCOUNTER — Other Ambulatory Visit: Payer: Self-pay

## 2022-12-20 ENCOUNTER — Other Ambulatory Visit: Payer: Self-pay

## 2022-12-21 ENCOUNTER — Emergency Department (HOSPITAL_COMMUNITY)
Admission: EM | Admit: 2022-12-21 | Discharge: 2022-12-21 | Disposition: A | Discharge status: Home / Self Care | Payer: Medicaid Other | Attending: Emergency Medicine | Admitting: Emergency Medicine

## 2022-12-21 ENCOUNTER — Emergency Department (HOSPITAL_COMMUNITY): Payer: Medicaid Other

## 2022-12-21 ENCOUNTER — Other Ambulatory Visit: Payer: Self-pay

## 2022-12-21 DIAGNOSIS — Z7951 Long term (current) use of inhaled steroids: Secondary | ICD-10-CM | POA: Insufficient documentation

## 2022-12-21 DIAGNOSIS — R062 Wheezing: Secondary | ICD-10-CM | POA: Diagnosis present

## 2022-12-21 DIAGNOSIS — J45901 Unspecified asthma with (acute) exacerbation: Secondary | ICD-10-CM

## 2022-12-21 DIAGNOSIS — Z20822 Contact with and (suspected) exposure to covid-19: Secondary | ICD-10-CM | POA: Diagnosis not present

## 2022-12-21 DIAGNOSIS — J4541 Moderate persistent asthma with (acute) exacerbation: Secondary | ICD-10-CM

## 2022-12-21 LAB — RESP PANEL BY RT-PCR (RSV, FLU A&B, COVID)  RVPGX2
Influenza A by PCR: NEGATIVE
Influenza B by PCR: NEGATIVE
Resp Syncytial Virus by PCR: NEGATIVE
SARS Coronavirus 2 by RT PCR: NEGATIVE

## 2022-12-21 MED ORDER — ALBUTEROL SULFATE HFA 108 (90 BASE) MCG/ACT IN AERS
2.0000 | INHALATION_SPRAY | Freq: Once | RESPIRATORY_TRACT | Status: AC
  Administered 2022-12-21: 2 via RESPIRATORY_TRACT
  Filled 2022-12-21: qty 6.7

## 2022-12-21 MED ORDER — ALBUTEROL SULFATE HFA 108 (90 BASE) MCG/ACT IN AERS: 1.0000 | INHALATION_SPRAY | Freq: Four times a day (QID) | RESPIRATORY_TRACT | 2 refills | Status: DC | PRN

## 2022-12-21 NOTE — ED Provider Notes (Signed)
[facility] Provider Note   CSN: [id number] Arrival date & time: 12/21/22  0730     History  Chief Complaint  Patient presents with   Asthma    Hector Gross is a 22 y.o. male with asthma who presents with wheezing, cough.   Pt reports asthma flare up x 2-3 days. Reports wheezing and nonproductive cough and being out of his inhaler prescription d/t financial reasons. Denies any other flu-like symptoms, fevers/chills. States he does not feel short of breath at rest but when he gets up and moves around he feels SOB. Has had an inhaler in the past and it has helped. Otherwise feels in his [place], no CP or LEE.   Asthma       Home Medications Prior to Admission medications   Medication Sig Start Date End Date Taking? Authorizing Provider  Acetaminophen (TYLENOL PO) Take 2 tablets by mouth every 6 (six) hours as needed (headache/pain).    [provider]  albuterol (PROVENTIL) (2.5 MG/3ML) 0.083% nebulizer solution Take 3 mLs (2.5 mg total) by nebulization every 6 (six) hours as needed for wheezing or shortness of breath. 05/13/22   Gildardo Pounds, NP  albuterol (VENTOLIN HFA) 108 (90 Base) MCG/ACT inhaler Inhale 1-2 puffs into the lungs every 6 (six) hours as needed for wheezing or shortness of breath. 12/21/22   Audley Hose, MD  fluticasone furoate-vilanterol (BREO ELLIPTA) 100-25 MCG/ACT AEPB Inhale 1 puff into the lungs daily. 11/27/22   Charlott Rakes, MD  montelukast (SINGULAIR) 10 MG tablet Take 1 tablet (10 mg total) by mouth at bedtime. 11/09/22   Gildardo Pounds, NP  Respiratory Therapy Supplies (NEBULIZER MASK ADULT) MISC 1 Device by Does not apply route daily as needed (for cough, wheezing or shortness of breath). J45.41 06/07/21   Gildardo Pounds, NP  Respiratory Therapy Supplies (NEBULIZER) DEVI 1 Device by Does not apply route daily as needed (for cough, wheezing or shortness of breath). J45.41 06/07/21   Gildardo Pounds, NP  triamcinolone ointment (KENALOG) 0.1 % Apply 1 Application topically 2 (two) times daily as needed. 04/02/22   Gildardo Pounds, NP      Allergies    Patient has no known allergies.    Review of Systems   Review of Systems Review of systems Negative for f/c.  A 10 point review of systems was performed and is negative unless otherwise reported in HPI.  Physical Exam Updated Vital Signs BP (!) 113/93 (BP Location: Right Arm)   Pulse 67   Temp (!) 78.7 F (25.9 C)   Resp 18   Ht '5\' 11"'$  (1.803 m)   Wt 81.6 kg   SpO2 99%   BMI 25.10 kg/m  Physical Exam General: Normal appearing male, sitting in bed.  HEENT: Sclera anicteric, MMM, trachea midline.  Cardiology: RRR, no murmurs/rubs/gallops. Resp: Normal respiratory rate and effort. Inspiratory and expiratory wheezing, mild, on the left. No wheezing noted on the right. No rhonchi, crackles.  Abd: Soft, non-tender, non-distended. No rebound tenderness or guarding.  GU: Deferred. MSK: No peripheral edema or signs of trauma.Skin: warm, dry.  Neuro: A&Ox4, CNs II-XII grossly intact. MAEs. Sensation grossly intact.  Psych: Normal mood and affect.   ED Results / Procedures / Treatments   Labs (all labs ordered are listed, but only abnormal results are displayed) Labs Reviewed  RESP PANEL BY RT-PCR (RSV, FLU A&B, COVID)  [id number]    EKG None  Radiology  DG Chest 2 View  Result Date: 12/21/2022 CLINICAL DATA:  asthma, cough EXAM: CHEST - 2 VIEW COMPARISON:  Chest x-ray 06/23/2021. FINDINGS: The heart size and mediastinal contours are within normal limits. Both lungs are clear. The visualized skeletal structures are unremarkable. IMPRESSION: No active cardiopulmonary disease. Electronically Signed   By: Margaretha Sheffield M.D.   On: 12/21/2022 08:17    Procedures Procedures    Medications Ordered in ED Medications  albuterol (VENTOLIN HFA) 108 (90 Base) MCG/ACT inhaler 2 puff (2 puffs Inhalation Given 12/21/22 [id number])     ED Course/ Medical Decision Making/ A&P                          Medical Decision Making Amount and/or Complexity of Data Reviewed Labs:  Decision-making details documented in ED Course. Radiology: ordered. Decision-making details documented in ED Course.  Risk Prescription drug management.    This patient presents to the ED for concern of wheezing/cough with h/o asthma; this involves an extensive number of treatment options, and is a complaint that carries with it a high risk of complications and morbidity.  I considered the following differential and admission for this acute, potentially life threatening condition. However patient is in no resp distress, overall well-appearing, HDS, not hypoxic on RA.   MDM:    Patient with likely asthma exacerbation. Given that wheezing is unilateral with cough will also obtain CXR to r/o pneumonia. Consider URI or bronchitis as well. No crackles on exam or peripheral edema to indicate CHF. No CP to indicate ACS or PE, no signs/sxs of DVT. Will give albuterol inhaler treatment here and reassess. No respiratory distress, no hypoxia, well-appearing overall.    Clinical Course as of 12/21/22 0932  Fri Dec 21, 2022  0828 DG Chest 2 View FINDINGS: The heart size and mediastinal contours are within normal limits. Both lungs are clear. The visualized skeletal structures are unremarkable.  IMPRESSION: No active cardiopulmonary disease.   [HN]  0828 SpO2: 97 % [HN]  0828 O2 Device: Room Air [HN]  0842 Resp panel by RT-PCR (RSV, Flu A&B, Covid) Anterior Nasal Swab Neg [HN]  [id number] Patient is much improved after the albuterol.  He denies any shortness of breath.  On auscultation his wheezing is resolved.  Patient is still not hypoxic on room air and has no increased work of breathing.  Patient is safer discharge.  He was given an inhaler and was given a prescription as well for another 1.  I encourage patient to schedule the 2 puffs of the albuterol  inhaler every 4 hours for the next 2 to 3 days and then after that can use it only as needed.  I encouraged him to follow-up with his primary care physician within 1 to 2 weeks for his asthma maintenance care and reevaluation and given extensive discharge instructions and return precautions.  All questions answered to patient satisfaction. [HN]    Clinical Course User Index [HN] Audley Hose, MD    Labs: I Ordered, and personally interpreted labs.  The pertinent results include:  viral panel neg  Imaging Studies ordered: I ordered imaging studies including CXR I independently visualized and interpreted imaging. I agree with the radiologist interpretation  Additional history obtained from chart reivew.   Reevaluation: After the interventions noted above, I reevaluated the patient and found that they have :resolved  Social Determinants of Health: Patient lives independently   Disposition:  DC w/ discharge instructions/return precautions. All  questions answered to patient's satisfaction.    Co morbidities that complicate the patient evaluation  Past Medical History:  Diagnosis Date   Asthma      Medicines Meds ordered this encounter  Medications   albuterol (VENTOLIN HFA) 108 (90 Base) MCG/ACT inhaler 2 puff   albuterol (VENTOLIN HFA) 108 (90 Base) MCG/ACT inhaler    Sig: Inhale 1-2 puffs into the lungs every 6 (six) hours as needed for wheezing or shortness of breath.    Dispense:  54 g    Refill:  2    I have reviewed the patients home medicines and have made adjustments as needed  Problem List / ED Course: Problem List Items Addressed This Visit       Respiratory   Chronic asthma with acute exacerbation   Relevant Medications   albuterol (VENTOLIN HFA) 108 (90 Base) MCG/ACT inhaler   Asthma exacerbation - Primary   Relevant Medications   albuterol (VENTOLIN HFA) 108 (90 Base) MCG/ACT inhaler                This note was created using dictation  software, which may contain spelling or grammatical errors.    Audley Hose, MD 12/21/22 [phone]

## 2022-12-21 NOTE — Discharge Instructions (Addendum)
Thank you for coming to [facility] Emergency Department. You were seen for cough and wheezing. We did an exam, labs, and imaging, and these showed a minor asthma exacerbation.  Please use your inhaler 2 puffs every 4-6 hours scheduled for the next 2 to 3 days and then you can use it only as needed for wheezing or shortness of breath.  If you note that you are short of breath at rest, have worsening breathing, or chest pain, please come back to the emergency department. Please follow up with your primary care provider within 1 week.   Do not hesitate to return to the ED or call 911 if you experience: -Worsening symptoms -shortness of breath -chest pain -Lightheadedness, passing out -Fevers/chills -Anything else that concerns you

## 2022-12-21 NOTE — ED Triage Notes (Signed)
Pt reports asthma flare up x 2-3 days. Reports wheezing and cough and being out of his inhaler prescription d/t financial reasons. Speaking in complete sentences without distress.

## 2022-12-21 NOTE — ED Notes (Signed)
Pt to xray

## 2022-12-21 NOTE — ED Notes (Signed)
This RN assumed care of patient. Pt presented to the ED with tight chest and SOB related to asthma from home. PT reports his insurance is unable to fill his asthma RX. Pt has history of asthma. Pt is resting on gurney at this time, respirations are spontaneous, even, unlabored and symmetrical bilaterally. Pt skin tone is appropriate for ethnicity, dry and warm. Pt connected to pulse ox and BP. Support person at bedside.

## 2022-12-21 NOTE — ED Notes (Signed)
Pt is well appearing, NAD noted at this time.

## 2022-12-21 NOTE — ED Notes (Signed)
Pt back from x-ray.

## 2022-12-21 NOTE — ED Notes (Signed)
This RN reviewed discharge instructions with patient. He verbalized understanding and denied any further questions. PT well appearing upon discharge and reports tolerable pain. Pt ambulated with stable gait to exit. Pt endorses ride home.  

## 2022-12-26 ENCOUNTER — Other Ambulatory Visit: Payer: Self-pay

## 2022-12-27 ENCOUNTER — Other Ambulatory Visit: Payer: Self-pay

## 2022-12-27 ENCOUNTER — Encounter (HOSPITAL_COMMUNITY): Payer: Self-pay

## 2022-12-27 ENCOUNTER — Emergency Department (HOSPITAL_COMMUNITY)
Admission: EM | Admit: 2022-12-27 | Discharge: 2022-12-27 | Disposition: A | Discharge status: Home / Self Care | Payer: Medicaid Other | Attending: Emergency Medicine | Admitting: Emergency Medicine

## 2022-12-27 DIAGNOSIS — R12 Heartburn: Secondary | ICD-10-CM | POA: Insufficient documentation

## 2022-12-27 DIAGNOSIS — J45909 Unspecified asthma, uncomplicated: Secondary | ICD-10-CM | POA: Diagnosis not present

## 2022-12-27 DIAGNOSIS — R0602 Shortness of breath: Secondary | ICD-10-CM | POA: Diagnosis present

## 2022-12-27 MED ORDER — METHYLPREDNISOLONE 4 MG PO TBPK: ORAL_TABLET | ORAL | 0 refills | Status: DC

## 2022-12-27 NOTE — ED Triage Notes (Signed)
PT came in POV with c/o of tightness in the chest, feellings of lightheadeness  while at work. Pt left when he became dizzy.Symptoms have been ongoing for the last week and Pt states that he visited the emergency room last week and they gave him an inhaler which he states  that he has to use every 5 min due to frequent shortness of breath.Pt does have a dx of asthma.

## 2022-12-27 NOTE — Discharge Instructions (Signed)
Take zytrec or xyzal. Take prilosec or nexium Get help right away if: You are getting worse and do not respond to treatment during an asthma attack. You are short of breath when at rest or when doing very little physical activity. You have difficulty eating, drinking, or talking. You have chest pain or tightness. You develop a fast heartbeat or palpitations. You have a bluish color to your lips or fingernails. You are light-headed or dizzy, or you faint. Your peak flow reading is less than 50% of your personal best. You feel too tired to breathe normally. These symptoms may be an emergency. Get help right away. Call 911. Do not wait to see if the symptoms will go away. Do not drive yourself to the hospital.

## 2022-12-27 NOTE — ED Provider Notes (Signed)
[facility] Provider Note   CSN: [id number] Arrival date & time: 12/27/22  1022     History  Chief Complaint  Patient presents with   Shortness of Breath    Hector Gross is a 22 y.o. male who presents emergency department for asthma.  He has seasonal allergies.  He was seen for the same complaint on 06/04/2023.  Patient reports that he has been using his inhaler a lot and still having coughing wheezing and tightness.  He has seasonal allergies and also reports he has been having increased heartburn.  He does not feel specifically short of breath right now but feels like his treatment is not well-controlled.  HPI     Home Medications Prior to Admission medications   Medication Sig Start Date End Date Taking? Authorizing Provider  methylPREDNISolone (MEDROL DOSEPAK) 4 MG TBPK tablet Use as directed 12/27/22  Yes Antoney Biven, PA-C  Acetaminophen (TYLENOL PO) Take 2 tablets by mouth every 6 (six) hours as needed (headache/pain).    [provider]  albuterol (PROVENTIL) (2.5 MG/3ML) 0.083% nebulizer solution Take 3 mLs (2.5 mg total) by nebulization every 6 (six) hours as needed for wheezing or shortness of breath. 05/13/22   Gildardo Pounds, NP  albuterol (VENTOLIN HFA) 108 (90 Base) MCG/ACT inhaler Inhale 1-2 puffs into the lungs every 6 (six) hours as needed for wheezing or shortness of breath. 12/21/22   Audley Hose, MD  fluticasone furoate-vilanterol (BREO ELLIPTA) 100-25 MCG/ACT AEPB Inhale 1 puff into the lungs daily. 11/27/22   Charlott Rakes, MD  montelukast (SINGULAIR) 10 MG tablet Take 1 tablet (10 mg total) by mouth at bedtime. 11/09/22   Gildardo Pounds, NP  Respiratory Therapy Supplies (NEBULIZER MASK ADULT) MISC 1 Device by Does not apply route daily as needed (for cough, wheezing or shortness of breath). J45.41 06/07/21   Gildardo Pounds, NP  Respiratory Therapy Supplies (NEBULIZER) DEVI 1 Device by Does not  apply route daily as needed (for cough, wheezing or shortness of breath). J45.41 06/07/21   Gildardo Pounds, NP  triamcinolone ointment (KENALOG) 0.1 % Apply 1 Application topically 2 (two) times daily as needed. 04/02/22   Gildardo Pounds, NP      Allergies    Patient has no known allergies.    Review of Systems   Review of Systems  Physical Exam Updated Vital Signs BP 124/81   Pulse 88   Temp 97.9 F (36.6 C) (Oral)   Resp 16   Ht '5\' 11"'$  (1.803 m)   Wt 81.6 kg   SpO2 96%   BMI 25.09 kg/m  Physical Exam Vitals and nursing note reviewed.  Constitutional:      General: He is not in acute distress.    Appearance: He is well-developed. He is not diaphoretic.  HENT:     Head: Normocephalic and atraumatic.  Eyes:     General: No scleral icterus.    Conjunctiva/sclera: Conjunctivae normal.  Cardiovascular:     Rate and Rhythm: Normal rate and regular rhythm.     Heart sounds: Normal heart sounds.  Pulmonary:     Effort: Pulmonary effort is normal. No respiratory distress.     Breath sounds: Wheezing present.     Comments: Minimal  Abdominal:     Palpations: Abdomen is soft.     Tenderness: There is no abdominal tenderness.  Musculoskeletal:     Cervical back: Normal range of motion and neck supple.  Skin:    General: Skin is warm and dry.  Neurological:     Mental Status: He is alert.  Psychiatric:        Behavior: Behavior normal.     ED Results / Procedures / Treatments   Labs (all labs ordered are listed, but only abnormal results are displayed) Labs Reviewed - No data to display  EKG None  Radiology No results found.  Procedures Procedures    Medications Ordered in ED Medications - No data to display  ED Course/ Medical Decision Making/ A&P                             Medical Decision Making  Patient here with mild wheezing.  He still using his inhaler several times a day.  Will add a steroid pack.  Recommend over-the-counter PPI and allergy  treatment.  Follow-up with PCP.  Discussed return precautions.        Final Clinical Impression(s) / ED Diagnoses Final diagnoses:  Asthma due to seasonal allergies  Heartburn    Rx / DC Orders ED Discharge Orders          Ordered    methylPREDNISolone (MEDROL DOSEPAK) 4 MG TBPK tablet        12/27/22 [place], [place], PA-C 12/27/22 [place], [place], DO 12/27/22 1130

## 2022-12-28 ENCOUNTER — Other Ambulatory Visit: Payer: Self-pay

## 2023-01-07 ENCOUNTER — Emergency Department (HOSPITAL_COMMUNITY)
Admission: EM | Admit: 2023-01-07 | Discharge: 2023-01-07 | Disposition: A | Discharge status: Home / Self Care | Payer: Medicaid Other | Attending: Emergency Medicine | Admitting: Emergency Medicine

## 2023-01-07 ENCOUNTER — Emergency Department (HOSPITAL_COMMUNITY): Payer: Medicaid Other

## 2023-01-07 ENCOUNTER — Other Ambulatory Visit: Payer: Self-pay

## 2023-01-07 ENCOUNTER — Encounter (HOSPITAL_COMMUNITY): Payer: Self-pay

## 2023-01-07 DIAGNOSIS — J4541 Moderate persistent asthma with (acute) exacerbation: Secondary | ICD-10-CM

## 2023-01-07 DIAGNOSIS — Z20822 Contact with and (suspected) exposure to covid-19: Secondary | ICD-10-CM | POA: Diagnosis not present

## 2023-01-07 DIAGNOSIS — J45909 Unspecified asthma, uncomplicated: Secondary | ICD-10-CM | POA: Diagnosis not present

## 2023-01-07 DIAGNOSIS — R0602 Shortness of breath: Secondary | ICD-10-CM | POA: Insufficient documentation

## 2023-01-07 DIAGNOSIS — R0689 Other abnormalities of breathing: Secondary | ICD-10-CM | POA: Diagnosis not present

## 2023-01-07 DIAGNOSIS — Z7951 Long term (current) use of inhaled steroids: Secondary | ICD-10-CM | POA: Insufficient documentation

## 2023-01-07 LAB — SARS CORONAVIRUS 2 BY RT PCR: SARS Coronavirus 2 by RT PCR: NEGATIVE

## 2023-01-07 MED ORDER — IPRATROPIUM-ALBUTEROL 0.5-2.5 (3) MG/3ML IN SOLN
3.0000 mL | Freq: Once | RESPIRATORY_TRACT | Status: AC
  Administered 2023-01-07: 3 mL via RESPIRATORY_TRACT
  Filled 2023-01-07: qty 3

## 2023-01-07 MED ORDER — MAGNESIUM SULFATE 2 GM/50ML IV SOLN
2.0000 g | Freq: Once | INTRAVENOUS | Status: AC
  Administered 2023-01-07: 2 g via INTRAVENOUS
  Filled 2023-01-07: qty 50

## 2023-01-07 MED ORDER — PREDNISONE 10 MG (21) PO TBPK: ORAL_TABLET | Freq: Every day | ORAL | 0 refills | Status: DC

## 2023-01-07 MED ORDER — ALBUTEROL SULFATE HFA 108 (90 BASE) MCG/ACT IN AERS: 1.0000 | INHALATION_SPRAY | Freq: Four times a day (QID) | RESPIRATORY_TRACT | 1 refills | Status: DC | PRN

## 2023-01-07 MED ORDER — ALBUTEROL SULFATE HFA 108 (90 BASE) MCG/ACT IN AERS
1.0000 | INHALATION_SPRAY | RESPIRATORY_TRACT | Status: DC | PRN
  Administered 2023-01-07: 1 via RESPIRATORY_TRACT
  Filled 2023-01-07: qty 6.7

## 2023-01-07 MED ORDER — CETIRIZINE HCL 10 MG PO TABS: 10.0000 mg | ORAL_TABLET | Freq: Every day | ORAL | 0 refills | Status: DC

## 2023-01-07 NOTE — ED Triage Notes (Signed)
PT arrived via Ems.Has hx of asthma but is currently out of his inhaler and can't afford to purchase a new one. Expiratory and Inspiratory wheezing heard by EMS. 2 Albuterol and 1 Atrovent given by EMS, PT was also given 125 mg Solu-medrol.Has a 18 gauge in Left AC, Last BP was 127/81, O2 was 96% and P:60.

## 2023-01-07 NOTE — Discharge Instructions (Signed)
It was a pleasure caring for you today in the emergency department. ° °Please return to the emergency department for any worsening or worrisome symptoms. ° ° °

## 2023-01-07 NOTE — ED Provider Notes (Signed)
[facility] Provider Note  CSN: [id number] Arrival date & time: 01/07/23 1123  Chief Complaint(s) Shortness of Breath  HPI Hector Gross is a 22 y.o. male with past medical history as below, significant for asthma who presents to the ED with complaint of dib.  Patient reports known history of asthma, unable to afford albuterol inhaler.  Worsening DIB over past 2 days, nonproductive cough, chest tightness.  No fevers or chills.  No nausea or vomiting.  No sick contacts or recent travel.  Diffuse wheezing on arrival per EMS, given nebulized breathing treatment, Solu-Medrol with mild improvement.  Past Medical Hisretory Past Medical History:  Diagnosis Date   Asthma    Patient Active Problem List   Diagnosis Date Noted   Acute asthma exacerbation 06/24/2021   Asthma exacerbation [id number]   Uncomplicated asthma    Asthma, chronic, unspecified asthma severity, with acute exacerbation 04/23/2021   Chronic asthma with acute exacerbation 04/23/2021   Home Medication(s) Prior to Admission medications   Medication Sig Start Date End Date Taking? Authorizing Provider  Acetaminophen (TYLENOL PO) Take 2 tablets by mouth every 6 (six) hours as needed (headache/pain).    [provider]  albuterol (PROVENTIL) (2.5 MG/3ML) 0.083% nebulizer solution Take 3 mLs (2.5 mg total) by nebulization every 6 (six) hours as needed for wheezing or shortness of breath. 05/13/22   Gildardo Pounds, NP  albuterol (VENTOLIN HFA) 108 (90 Base) MCG/ACT inhaler Inhale 1-2 puffs into the lungs every 6 (six) hours as needed for wheezing or shortness of breath. 12/21/22   Audley Hose, MD  fluticasone furoate-vilanterol (BREO ELLIPTA) 100-25 MCG/ACT AEPB Inhale 1 puff into the lungs daily. 11/27/22   Charlott Rakes, MD  methylPREDNISolone (MEDROL DOSEPAK) 4 MG TBPK tablet Use as directed 12/27/22   Margarita Mail, PA-C  montelukast (SINGULAIR) 10 MG tablet Take 1  tablet (10 mg total) by mouth at bedtime. 11/09/22   Gildardo Pounds, NP  Respiratory Therapy Supplies (NEBULIZER MASK ADULT) MISC 1 Device by Does not apply route daily as needed (for cough, wheezing or shortness of breath). J45.41 06/07/21   Gildardo Pounds, NP  Respiratory Therapy Supplies (NEBULIZER) DEVI 1 Device by Does not apply route daily as needed (for cough, wheezing or shortness of breath). J45.41 06/07/21   Gildardo Pounds, NP  triamcinolone ointment (KENALOG) 0.1 % Apply 1 Application topically 2 (two) times daily as needed. 04/02/22   Gildardo Pounds, NP                                                                                                                                    Past Surgical History History reviewed. No pertinent surgical history. Family History Family History  Problem Relation Age of Onset   Diabetes Maternal Grandmother     Social History Social History   Tobacco Use   Smoking status: Never  Smokeless tobacco: Never  Vaping Use   Vaping Use: Never used  Substance Use Topics   Alcohol use: No   Drug use: Yes    Types: Marijuana   Allergies Patient has no known allergies.  Review of Systems Review of Systems  Physical Exam Vital Signs  I have reviewed the triage vital signs BP 135/81 (BP Location: Right Arm)   Pulse 73   Temp 97.6 F (36.4 C) (Oral)   Ht 5\' 11"  (1.803 m)   Wt 81.6 kg   SpO2 98%   BMI 25.09 kg/m  Physical Exam  ED Results and Treatments Labs (all labs ordered are listed, but only abnormal results are displayed) Labs Reviewed  SARS CORONAVIRUS 2 BY RT PCR  CBC WITH DIFFERENTIAL/PLATELET  BASIC METABOLIC PANEL                                                                                                                          Radiology No results found.  Pertinent labs & imaging results that were available during my care of the patient were reviewed by me and considered in my medical decision making (see  MDM for details).  Medications Ordered in ED Medications  magnesium sulfate IVPB 2 g 50 mL (has no administration in time range)  ipratropium-albuterol (DUONEB) 0.5-2.5 (3) MG/3ML nebulizer solution 3 mL (has no administration in time range)                                                                                                                                     Procedures Procedures  (including critical care time)  Medical Decision Making / ED Course    Medical Decision Making:    Hector Gross is a 22 y.o. male ***. The complaint involves an extensive differential diagnosis and also carries with it a high risk of complications and morbidity.  Serious etiology was considered. Ddx includes but is not limited to: ***  Complete initial physical exam performed, notably the patient  was ***.    Reviewed and confirmed nursing documentation for past medical history, family history, social history.  Vital signs reviewed.        Additional history obtained: -Additional history obtained from {[email or web address]:[id number]} -External records from outside source obtained and reviewed including: Chart review including previous notes, labs, imaging, consultation notes including ***   Lab Tests: -I ordered, reviewed, and interpreted labs.  The pertinent results include:   Labs Reviewed  SARS CORONAVIRUS 2 BY RT PCR  CBC WITH DIFFERENTIAL/PLATELET  BASIC METABOLIC PANEL    Notable for ***  EKG   EKG Interpretation  Date/Time:    Ventricular Rate:    PR Interval:    QRS Duration:   QT Interval:    QTC Calculation:   R Axis:     Text Interpretation:           Imaging Studies ordered: I ordered imaging studies including *** I independently visualized the following imaging with scope of interpretation limited to determining acute life threatening conditions related to emergency care; findings noted above, significant for *** I independently visualized and  interpreted imaging. I agree with the radiologist interpretation   Medicines ordered and prescription drug management: Meds ordered this encounter  Medications   magnesium sulfate IVPB 2 g 50 mL   ipratropium-albuterol (DUONEB) 0.5-2.5 (3) MG/3ML nebulizer solution 3 mL    -I have reviewed the patients home medicines and have made adjustments as needed   Consultations Obtained: I requested consultation with the ***,  and discussed lab and imaging findings as well as pertinent plan - they recommend: ***   Cardiac Monitoring: The patient was maintained on a cardiac monitor.  I personally viewed and interpreted the cardiac monitored which showed an underlying rhythm of: ***  Social Determinants of Health:  Diagnosis or treatment significantly limited by social determinants of health: {wssoc:28071}   Reevaluation: After the interventions noted above, I reevaluated the patient and found that they have {resolved/improved/worsened:23923::"improved"}  Co morbidities that complicate the patient evaluation  Past Medical History:  Diagnosis Date   Asthma       Dispostion: Disposition decision including need for hospitalization was considered, and patient {wsdispo:28070::"discharged from emergency department."}    Final Clinical Impression(s) / ED Diagnoses Final diagnoses:  None     This chart was dictated using voice recognition software.  Despite best efforts to proofread,  errors can occur which can change the documentation meaning.

## 2023-01-08 ENCOUNTER — Ambulatory Visit: Payer: Self-pay | Admitting: Nurse Practitioner

## 2023-01-11 ENCOUNTER — Other Ambulatory Visit: Payer: Self-pay

## 2023-01-22 ENCOUNTER — Emergency Department (HOSPITAL_COMMUNITY)
Admission: EM | Admit: 2023-01-22 | Discharge: 2023-01-22 | Disposition: A | Discharge status: Home / Self Care | Payer: Medicaid Other | Attending: Emergency Medicine | Admitting: Emergency Medicine

## 2023-01-22 ENCOUNTER — Encounter (HOSPITAL_COMMUNITY): Payer: Self-pay

## 2023-01-22 ENCOUNTER — Emergency Department (HOSPITAL_COMMUNITY): Payer: Medicaid Other

## 2023-01-22 ENCOUNTER — Other Ambulatory Visit: Payer: Self-pay

## 2023-01-22 DIAGNOSIS — J45901 Unspecified asthma with (acute) exacerbation: Secondary | ICD-10-CM | POA: Insufficient documentation

## 2023-01-22 DIAGNOSIS — Z7951 Long term (current) use of inhaled steroids: Secondary | ICD-10-CM | POA: Insufficient documentation

## 2023-01-22 DIAGNOSIS — R0602 Shortness of breath: Secondary | ICD-10-CM | POA: Diagnosis present

## 2023-01-22 MED ORDER — PREDNISONE 20 MG PO TABS: 60.0000 mg | ORAL_TABLET | Freq: Every day | ORAL | 0 refills | Status: AC

## 2023-01-22 MED ORDER — IPRATROPIUM BROMIDE HFA 17 MCG/ACT IN AERS
2.0000 | INHALATION_SPRAY | Freq: Once | RESPIRATORY_TRACT | Status: AC
  Administered 2023-01-22: 2 via RESPIRATORY_TRACT
  Filled 2023-01-22: qty 12.9

## 2023-01-22 MED ORDER — ALBUTEROL SULFATE HFA 108 (90 BASE) MCG/ACT IN AERS
6.0000 | INHALATION_SPRAY | Freq: Once | RESPIRATORY_TRACT | Status: AC
  Administered 2023-01-22: 6 via RESPIRATORY_TRACT
  Filled 2023-01-22: qty 6.7

## 2023-01-22 MED ORDER — METHYLPREDNISOLONE SODIUM SUCC 125 MG IJ SOLR
125.0000 mg | Freq: Once | INTRAMUSCULAR | Status: AC
  Administered 2023-01-22: 125 mg via INTRAMUSCULAR
  Filled 2023-01-22: qty 2

## 2023-01-22 NOTE — ED Notes (Signed)
Patient awake and alert, currently on his phone, no s/s of distress.

## 2023-01-22 NOTE — ED Provider Notes (Signed)
[place] EMERGENCY DEPARTMENT AT [facility] Provider Note   CSN: [id number] Arrival date & time: 01/22/23  1711     History  Chief Complaint  Patient presents with   Shortness of Breath    DETAVIOUS Gross is a 22 y.o. male.  Patient here with shortness of breath.  Feels like he is having asthma exacerbation.  Vital signs normal with EMS.  He got breathing treatment with EMS that has helped a little bit.  He still feels tight.  Feels like it is due to the pollen in the air.  Denies any fever or chills or chest pain.  No recent surgery or travel    The history is provided by the patient.       Home Medications Prior to Admission medications   Medication Sig Start Date End Date Taking? Authorizing Provider  predniSONE (DELTASONE) 20 MG tablet Take 3 tablets (60 mg total) by mouth daily for 5 days. 01/22/23 01/27/23 Yes Maricia Scotti, DO  Acetaminophen (TYLENOL PO) Take 2 tablets by mouth every 6 (six) hours as needed (headache/pain).    [provider]  albuterol (PROVENTIL) (2.5 MG/3ML) 0.083% nebulizer solution Take 3 mLs (2.5 mg total) by nebulization every 6 (six) hours as needed for wheezing or shortness of breath. 05/13/22   [name], NP  albuterol (VENTOLIN HFA) 108 (90 Base) MCG/ACT inhaler Inhale 1-2 puffs into the lungs every 6 (six) hours as needed for wheezing or shortness of breath. 12/21/22   Loetta Rough, MD  albuterol (VENTOLIN HFA) 108 (90 Base) MCG/ACT inhaler Inhale 1-2 puffs into the lungs every 6 (six) hours as needed for wheezing or shortness of breath. 01/07/23   Sloan Leiter, DO  cetirizine (ZYRTEC ALLERGY) 10 MG tablet Take 1 tablet (10 mg total) by mouth daily. 01/07/23 02/06/23  Tanda Rockers A, DO  fluticasone furoate-vilanterol (BREO ELLIPTA) 100-25 MCG/ACT AEPB Inhale 1 puff into the lungs daily. 11/27/22   Hoy Register, MD  montelukast (SINGULAIR) 10 MG tablet Take 1 tablet (10 mg total) by mouth at bedtime. 11/09/22    [name], NP  Respiratory Therapy Supplies (NEBULIZER MASK ADULT) MISC 1 Device by Does not apply route daily as needed (for cough, wheezing or shortness of breath). J45.41 06/07/21   [name], NP  Respiratory Therapy Supplies (NEBULIZER) DEVI 1 Device by Does not apply route daily as needed (for cough, wheezing or shortness of breath). J45.41 06/07/21   [name], NP  triamcinolone ointment (KENALOG) 0.1 % Apply 1 Application topically 2 (two) times daily as needed. 04/02/22   [name], NP      Allergies    Patient has no known allergies.    Review of Systems   Review of Systems  Physical Exam Updated Vital Signs BP 124/70 (BP Location: Left Arm)   Pulse 85   Temp 98 F (36.7 C) (Oral)   Resp 20   Ht 6' (1.829 m)   Wt 81.6 kg   SpO2 97%   BMI 24.41 kg/m  Physical Exam Vitals and nursing note reviewed.  Constitutional:      General: He is not in acute distress.    Appearance: He is well-developed.  HENT:     Head: Normocephalic and atraumatic.  Eyes:     Conjunctiva/sclera: Conjunctivae normal.  Cardiovascular:     Rate and Rhythm: Normal rate and regular rhythm.     Pulses: Normal pulses.     Heart sounds:  Normal heart sounds. No murmur heard. Pulmonary:     Effort: Tachypnea present. No respiratory distress.     Breath sounds: Wheezing present.     Comments: Mildly tachypneic with some wheezing worse on the right than the left, good air movement, no respiratory distress Abdominal:     Palpations: Abdomen is soft.     Tenderness: There is no abdominal tenderness.  Musculoskeletal:        General: No swelling.     Cervical back: Neck supple.  Skin:    General: Skin is warm and dry.     Capillary Refill: Capillary refill takes less than 2 seconds.  Neurological:     Mental Status: He is alert.  Psychiatric:        Mood and Affect: Mood normal.     ED Results / Procedures / Treatments   Labs (all labs ordered are listed, but only  abnormal results are displayed) Labs Reviewed - No data to display  EKG None  Radiology DG Chest 2 View  Result Date: 01/22/2023 CLINICAL DATA:  Cough and shortness of breath. EXAM: CHEST - 2 VIEW COMPARISON:  Chest radiograph 01/07/2023 FINDINGS: The cardiomediastinal contours are within normal limits. The lungs are clear. No pneumothorax or pleural effusion. No acute finding in the visualized skeleton. IMPRESSION: No active cardiopulmonary disease. Electronically Signed   By: Emmaline Kluver M.D.   On: 01/22/2023 17:57    Procedures Procedures    Medications Ordered in ED Medications  ipratropium (ATROVENT HFA) inhaler 2 puff (2 puffs Inhalation Given 01/22/23 1813)  albuterol (VENTOLIN HFA) 108 (90 Base) MCG/ACT inhaler 6 puff (6 puffs Inhalation Given 01/22/23 1813)  methylPREDNISolone sodium succinate (SOLU-MEDROL) 125 mg/2 mL injection 125 mg (125 mg Intramuscular Given 01/22/23 1813)    ED Course/ Medical Decision Making/ A&P                             Medical Decision Making Amount and/or Complexity of Data Reviewed Radiology: ordered.  Risk Prescription drug management.   Hector Gross is here with shortness of breath.  Overall differential diagnosis likely asthma exacerbation in the setting of environmental allergies.  Vital signs are normal.  No fever.  Will give breathing treatment, IM Solu-Medrol and get a chest x-ray and reevaluate.  Have no concern for ACS or PE.  Chest x-ray with no evidence of pneumonia or pneumothorax per my review and interpretation.  Patient feeling much better after breathing treatments.  Will continue prednisone and breathing treatments at home.  Discharged in good condition.  Overall asthma exacerbation likely in setting of allergies.  This chart was dictated using voice recognition software.  Despite best efforts to proofread,  errors can occur which can change the documentation meaning.         Final Clinical Impression(s) / ED  Diagnoses Final diagnoses:  Mild asthma with exacerbation, unspecified whether persistent    Rx / DC Orders ED Discharge Orders          Ordered    predniSONE (DELTASONE) 20 MG tablet  Daily        01/22/23 1943              Virgina Norfolk, DO 01/22/23 1943

## 2023-01-22 NOTE — ED Notes (Signed)
Patient verbalizes understanding of discharge instructions. Opportunity for questioning and answers were provided. Armband removed by staff, pt discharged from ED. Pt ambulatory to ED waiting room with steady gait.  

## 2023-01-22 NOTE — ED Triage Notes (Signed)
PT BIB EMS for shortness of breath, history of Asthma, wheezing noted.   Duoneb in route BP 120/74 HR 90 100 RA

## 2023-01-28 ENCOUNTER — Emergency Department (HOSPITAL_COMMUNITY): Payer: Medicaid Other

## 2023-01-28 ENCOUNTER — Encounter (HOSPITAL_COMMUNITY): Payer: Self-pay

## 2023-01-28 ENCOUNTER — Other Ambulatory Visit: Payer: Self-pay

## 2023-01-28 ENCOUNTER — Inpatient Hospital Stay (HOSPITAL_COMMUNITY)
Admission: EM | Admit: 2023-01-28 | Discharge: 2023-01-31 | DRG: 202 | Disposition: A | Discharge status: Home / Self Care | Payer: Medicaid Other | Attending: Internal Medicine | Admitting: Internal Medicine

## 2023-01-28 DIAGNOSIS — J45901 Unspecified asthma with (acute) exacerbation: Secondary | ICD-10-CM | POA: Diagnosis present

## 2023-01-28 DIAGNOSIS — J4541 Moderate persistent asthma with (acute) exacerbation: Principal | ICD-10-CM

## 2023-01-28 DIAGNOSIS — Z1152 Encounter for screening for COVID-19: Secondary | ICD-10-CM

## 2023-01-28 DIAGNOSIS — F1210 Cannabis abuse, uncomplicated: Secondary | ICD-10-CM | POA: Diagnosis present

## 2023-01-28 DIAGNOSIS — Z87891 Personal history of nicotine dependence: Secondary | ICD-10-CM | POA: Diagnosis not present

## 2023-01-28 DIAGNOSIS — Z833 Family history of diabetes mellitus: Secondary | ICD-10-CM

## 2023-01-28 DIAGNOSIS — J9601 Acute respiratory failure with hypoxia: Secondary | ICD-10-CM | POA: Diagnosis present

## 2023-01-28 DIAGNOSIS — Z91148 Patient's other noncompliance with medication regimen for other reason: Secondary | ICD-10-CM | POA: Diagnosis not present

## 2023-01-28 LAB — RAPID URINE DRUG SCREEN, HOSP PERFORMED
Amphetamines: NOT DETECTED
Barbiturates: NOT DETECTED
Benzodiazepines: NOT DETECTED
Cocaine: NOT DETECTED
Opiates: NOT DETECTED
Tetrahydrocannabinol: NOT DETECTED

## 2023-01-28 LAB — I-STAT VENOUS BLOOD GAS, ED
Acid-base deficit: 3 mmol/L — ABNORMAL HIGH (ref 0.0–2.0)
Bicarbonate: 25 mmol/L (ref 20.0–28.0)
Calcium, Ion: 1.29 mmol/L (ref 1.15–1.40)
HCT: 47 % (ref 39.0–52.0)
Hemoglobin: 16 g/dL (ref 13.0–17.0)
O2 Saturation: 98 %
Potassium: 3.5 mmol/L (ref 3.5–5.1)
Sodium: 140 mmol/L (ref 135–145)
TCO2: 27 mmol/L (ref 22–32)
pCO2, Ven: 54.1 mmHg (ref 44–60)
pH, Ven: 7.273 (ref 7.25–7.43)
pO2, Ven: 123 mmHg — ABNORMAL HIGH (ref 32–45)

## 2023-01-28 LAB — COMPREHENSIVE METABOLIC PANEL
ALT: 31 U/L (ref 0–44)
AST: 27 U/L (ref 15–41)
Albumin: 3.9 g/dL (ref 3.5–5.0)
Alkaline Phosphatase: 60 U/L (ref 38–126)
Anion gap: 9 (ref 5–15)
BUN: 10 mg/dL (ref 6–20)
CO2: 24 mmol/L (ref 22–32)
Calcium: 9.4 mg/dL (ref 8.9–10.3)
Chloride: 105 mmol/L (ref 98–111)
Creatinine, Ser: 1.09 mg/dL (ref 0.61–1.24)
GFR, Estimated: >60 mL/min (ref >60)
Glucose, Bld: 115 mg/dL — ABNORMAL HIGH (ref 70–99)
Potassium: 3.5 mmol/L (ref 3.5–5.1)
Sodium: 138 mmol/L (ref 135–145)
Total Bilirubin: 0.5 mg/dL (ref 0.3–1.2)
Total Protein: 7.5 g/dL (ref 6.5–8.1)

## 2023-01-28 LAB — RESP PANEL BY RT-PCR (RSV, FLU A&B, COVID)  RVPGX2
Influenza A by PCR: NEGATIVE
Influenza B by PCR: NEGATIVE
Resp Syncytial Virus by PCR: NEGATIVE
SARS Coronavirus 2 by RT PCR: NEGATIVE

## 2023-01-28 LAB — CBC
HCT: 45.7 % (ref 39.0–52.0)
Hemoglobin: 16 g/dL (ref 13.0–17.0)
MCH: 28.4 pg (ref 26.0–34.0)
MCHC: 35 g/dL (ref 30.0–36.0)
MCV: 81.2 fL (ref 80.0–100.0)
Platelets: 403 10*3/uL — ABNORMAL HIGH (ref 150–400)
RBC: 5.63 MIL/uL (ref 4.22–5.81)
RDW: 13 % (ref 11.5–15.5)
WBC: 8.7 10*3/uL (ref 4.0–10.5)
nRBC: 0 % (ref 0.0–0.2)

## 2023-01-28 LAB — HIV ANTIBODY (ROUTINE TESTING W REFLEX): HIV Screen 4th Generation wRfx: NONREACTIVE

## 2023-01-28 MED ORDER — FLUTICASONE FUROATE-VILANTEROL 100-25 MCG/ACT IN AEPB
1.0000 | INHALATION_SPRAY | Freq: Every day | RESPIRATORY_TRACT | Status: DC
  Administered 2023-01-30 – 2023-01-31 (×2): 1 via RESPIRATORY_TRACT
  Filled 2023-01-28: qty 28

## 2023-01-28 MED ORDER — LORATADINE 10 MG PO TABS
10.0000 mg | ORAL_TABLET | Freq: Every day | ORAL | Status: DC
  Administered 2023-01-28 – 2023-01-31 (×4): 10 mg via ORAL
  Filled 2023-01-28 (×4): qty 1

## 2023-01-28 MED ORDER — POLYETHYLENE GLYCOL 3350 17 G PO PACK: 17.0000 g | PACK | Freq: Every day | ORAL | Status: DC | PRN

## 2023-01-28 MED ORDER — IPRATROPIUM BROMIDE 0.02 % IN SOLN: 1.0000 mg | Freq: Once | RESPIRATORY_TRACT | Status: AC

## 2023-01-28 MED ORDER — METHYLPREDNISOLONE SODIUM SUCC 125 MG IJ SOLR
80.0000 mg | Freq: Two times a day (BID) | INTRAMUSCULAR | Status: DC
  Administered 2023-01-28 – 2023-01-29 (×3): 80 mg via INTRAVENOUS
  Filled 2023-01-28 (×3): qty 2

## 2023-01-28 MED ORDER — ALBUTEROL SULFATE (2.5 MG/3ML) 0.083% IN NEBU: 10.0000 mg/h | INHALATION_SOLUTION | RESPIRATORY_TRACT | Status: DC

## 2023-01-28 MED ORDER — ALBUTEROL SULFATE (2.5 MG/3ML) 0.083% IN NEBU
INHALATION_SOLUTION | RESPIRATORY_TRACT | Status: AC
  Administered 2023-01-28: 2.5 mg
  Filled 2023-01-28: qty 12

## 2023-01-28 MED ORDER — SODIUM CHLORIDE 0.9% FLUSH
3.0000 mL | Freq: Two times a day (BID) | INTRAVENOUS | Status: DC
  Administered 2023-01-28 – 2023-01-31 (×7): 3 mL via INTRAVENOUS

## 2023-01-28 MED ORDER — ACETAMINOPHEN 325 MG PO TABS: 650.0000 mg | ORAL_TABLET | Freq: Four times a day (QID) | ORAL | Status: DC | PRN

## 2023-01-28 MED ORDER — GUAIFENESIN ER 600 MG PO TB12: 600.0000 mg | ORAL_TABLET | Freq: Two times a day (BID) | ORAL | Status: DC | PRN

## 2023-01-28 MED ORDER — ALBUTEROL SULFATE (2.5 MG/3ML) 0.083% IN NEBU
2.5000 mg | INHALATION_SOLUTION | RESPIRATORY_TRACT | Status: DC | PRN
  Administered 2023-01-28: 2.5 mg via RESPIRATORY_TRACT

## 2023-01-28 MED ORDER — MONTELUKAST SODIUM 10 MG PO TABS
10.0000 mg | ORAL_TABLET | Freq: Every day | ORAL | Status: DC
  Administered 2023-01-28 – 2023-01-30 (×3): 10 mg via ORAL
  Filled 2023-01-28 (×3): qty 1

## 2023-01-28 MED ORDER — IPRATROPIUM BROMIDE 0.02 % IN SOLN
RESPIRATORY_TRACT | Status: AC
  Administered 2023-01-28: 0.5 mg
  Filled 2023-01-28: qty 5

## 2023-01-28 MED ORDER — BISACODYL 5 MG PO TBEC: 5.0000 mg | DELAYED_RELEASE_TABLET | Freq: Every day | ORAL | Status: DC | PRN

## 2023-01-28 MED ORDER — MAGNESIUM SULFATE 2 GM/50ML IV SOLN
2.0000 g | Freq: Once | INTRAVENOUS | Status: AC
  Administered 2023-01-28: 2 g via INTRAVENOUS
  Filled 2023-01-28: qty 50

## 2023-01-28 MED ORDER — ACETAMINOPHEN 650 MG RE SUPP: 650.0000 mg | Freq: Four times a day (QID) | RECTAL | Status: DC | PRN

## 2023-01-28 MED ORDER — IPRATROPIUM-ALBUTEROL 0.5-2.5 (3) MG/3ML IN SOLN
3.0000 mL | Freq: Four times a day (QID) | RESPIRATORY_TRACT | Status: DC
  Administered 2023-01-28 – 2023-01-30 (×8): 3 mL via RESPIRATORY_TRACT
  Filled 2023-01-28 (×2): qty 3
  Filled 2023-01-28: qty 6
  Filled 2023-01-28 (×4): qty 3

## 2023-01-28 MED ORDER — HYDRALAZINE HCL 20 MG/ML IJ SOLN: 5.0000 mg | INTRAMUSCULAR | Status: DC | PRN

## 2023-01-28 MED ORDER — ENOXAPARIN SODIUM 40 MG/0.4ML IJ SOSY
40.0000 mg | PREFILLED_SYRINGE | INTRAMUSCULAR | Status: DC
  Administered 2023-01-28 – 2023-01-30 (×3): 40 mg via SUBCUTANEOUS
  Filled 2023-01-28 (×4): qty 0.4

## 2023-01-28 MED ORDER — ONDANSETRON HCL 4 MG/2ML IJ SOLN: 4.0000 mg | Freq: Four times a day (QID) | INTRAMUSCULAR | Status: DC | PRN

## 2023-01-28 MED ORDER — ONDANSETRON HCL 4 MG PO TABS: 4.0000 mg | ORAL_TABLET | Freq: Four times a day (QID) | ORAL | Status: DC | PRN

## 2023-01-28 NOTE — Care Management (Signed)
CM Acknowledges "Consult to [facility] for Home Health/DME needs"  TOC will complete initial assessment and continue to follow for needs

## 2023-01-28 NOTE — ED Triage Notes (Signed)
Pt to ED via EMS from home. Pt c/o SOB, pt has hx of asthma, pt used rescue inhalers and nebs tonight with no relief, pt low 90%s on RA, pt placed on CPAP with EMS.   EMS Vitals: 10 albuterol  0.5 atrovent 125 IV solumedrol 18 LAC 130/78 100% CPAP 24 RR 100 HR

## 2023-01-28 NOTE — ED Notes (Signed)
Patient removed from bipap and placed on 3L nasal cannula. Pt tolerating well and denies complaints. Will continue to monitor.

## 2023-01-28 NOTE — H&P (Signed)
History and Physical    Patient: Hector Gross UYQ:[id number] DOB: April 22, 2001 DOA: 01/28/2023 DOS: the patient was seen and examined on 01/28/2023 PCP: [name], NP  Patient coming from: Home - lives with brother; NOK: Mother, Herbie Drape, [phone]   Chief Complaint: SOB  HPI: Hector Gross is a 22 y.o. male with medical history significant of asthma presenting with SOB.   He reports several days of wheezing and SOB.  No recent URI symptoms, no fever, no sick contacts.  He does periodically smoke marijuana, last within the week.  He does have seasonal allergies that may be contributing.  He has had previous hospitalizations, no intubations.  He was taken off BIPAP prior to my arrival but reports ongoing SOB, feels like he needs to go back on BIPAP.  In review of medications, he has not been taking antihistamine, controller inhaler, or Singulair.    ER Course:  Asthma exacerbation.  Came in on CPAP -> BIPAP.  Given nebs, moving some air better.  Trying to wean off BIPAP.  Given Solumedrol, nebs with EMS.  Nebs and Mag++ in ER.       Review of Systems: As mentioned in the history of present illness. All other systems reviewed and are negative. Past Medical History:  Diagnosis Date   Asthma    History reviewed. No pertinent surgical history. Social History:  reports that he has never smoked. He has never used smokeless tobacco. He reports current drug use. Drug: Marijuana. He reports that he does not drink alcohol.  No Known Allergies  Family History  Problem Relation Age of Onset   Diabetes Maternal Grandmother     Prior to Admission medications   Medication Sig Start Date End Date Taking? Authorizing Provider  albuterol (VENTOLIN HFA) 108 (90 Base) MCG/ACT inhaler Inhale 1-2 puffs into the lungs every 6 (six) hours as needed for wheezing or shortness of breath. 01/07/23  Yes Tanda Rockers A, DO  triamcinolone ointment (KENALOG) 0.1 % Apply 1 Application  topically 2 (two) times daily as needed. Patient taking differently: Apply 1 Application topically 2 (two) times daily as needed (for itching). 04/02/22  Yes [name], NP  albuterol (PROVENTIL) (2.5 MG/3ML) 0.083% nebulizer solution Take 3 mLs (2.5 mg total) by nebulization every 6 (six) hours as needed for wheezing or shortness of breath. Patient not taking: Reported on 01/28/2023 05/13/22   [name], NP  cetirizine (ZYRTEC ALLERGY) 10 MG tablet Take 1 tablet (10 mg total) by mouth daily. Patient not taking: Reported on 01/28/2023 01/07/23 02/06/23  Tanda Rockers A, DO  fluticasone furoate-vilanterol (BREO ELLIPTA) 100-25 MCG/ACT AEPB Inhale 1 puff into the lungs daily. Patient not taking: Reported on 01/28/2023 11/27/22   Hoy Register, MD  montelukast (SINGULAIR) 10 MG tablet Take 1 tablet (10 mg total) by mouth at bedtime. Patient not taking: Reported on 01/28/2023 11/09/22   [name], NP  Respiratory Therapy Supplies (NEBULIZER MASK ADULT) MISC 1 Device by Does not apply route daily as needed (for cough, wheezing or shortness of breath). J45.41 06/07/21   [name], NP  Respiratory Therapy Supplies (NEBULIZER) DEVI 1 Device by Does not apply route daily as needed (for cough, wheezing or shortness of breath). J45.41 06/07/21   [name], NP    Physical Exam: Vitals:   01/28/23 0919 01/28/23 0930 01/28/23 1001 01/28/23 1100  BP: (!) 117/93 (!) 122/100 130/88 133/73  Pulse: (!) 109 (!) 119 (!) 121 (!) 110  Resp: (!) 21 18 17 16   Temp:      TempSrc:      SpO2: 96% 92% 95% 94%   General:  Appears calm and comfortable and is in NAD, some increased WOB Eyes:  EOMI, normal lids, iris ENT:  grossly normal hearing, lips & tongue, mmm; appropriate dentition Neck:  no LAD, masses or thyromegaly Cardiovascular:  RR with mild tachycardia, obscured by breath sounds. No LE edema.  Respiratory:   Diffuse wheezing with moderate to poor air movement.  Mildly increased  respiratory effort. Abdomen:  soft, NT, ND Skin:  no rash or induration seen on limited exam Musculoskeletal:  grossly normal tone BUE/BLE, good ROM, no bony abnormality Psychiatric:  blunted mood and affect, speech fluent and appropriate, AOx3 Neurologic:  CN 2-12 grossly intact, moves all extremities in coordinated fashion   Radiological Exams on Admission: Independently reviewed - see discussion in A/P where applicable  DG Chest Port 1 View  Result Date: 01/28/2023 CLINICAL DATA:  Shortness of breath EXAM: PORTABLE CHEST 1 VIEW COMPARISON:  01/22/2023 FINDINGS: Artifact from EKG leads. Normal heart size and mediastinal contours. No acute infiltrate or edema. No effusion or pneumothorax. No acute osseous findings. IMPRESSION: No active disease. Electronically Signed   By: Tiburcio Pea M.D.   On: 01/28/2023 06:02    EKG: Independently reviewed.  Sinus tachycardia with rate 110; no evidence of acute ischemia   Labs on Admission: I have personally reviewed the available labs and imaging studies at the time of the admission.  Pertinent labs:    VBG: 7.273/54.1/25 Glucose 115 Normal CBC COVID/flu/RSV negative   Assessment and Plan: Active Problems:   Chronic asthma with acute exacerbation   Marijuana abuse    Asthma exacerbation -He has history of severe asthma without history of intubation but with multiple hospital admissions  -He stopped taking Zyrtec, Breo, and Singulair and continues to smoke marijuana. -Will resume Claritin, Breo, and Singulair at this time. -He may benefit from pulmonary consult as an outpatient; if he is not improved tomorrow, would consider inpatient consultation. -CXR negative for PNA; no other apparent viral infection -He continues to need BIPAP -will admit to progressive care -Nebulizers: prn albuterol and Duonebs -Solu-Medrol 80 mg IV BID  -No antibiotics at this time  -Received magnesium in the ER as well as continuous neb   Marijuana  abuse -Cessation encouraged; this should be encouraged on an ongoing basis as ongoing inhalation will increase risks of exacerbations -UDS ordered     Advance Care Planning:   Code Status: Full Code   Consults: RT; TOC team  DVT Prophylaxis: Lovenox  Family Communication: None present; he declined to have me contact his family at the time of admission  Severity of Illness: The appropriate patient status for this patient is INPATIENT. Inpatient status is judged to be reasonable and necessary in order to provide the required intensity of service to ensure the patient's safety. The patient's presenting symptoms, physical exam findings, and initial radiographic and laboratory data in the context of their chronic comorbidities is felt to place them at high risk for further clinical deterioration. Furthermore, it is not anticipated that the patient will be medically stable for discharge from the hospital within 2 midnights of admission.   * I certify that at the point of admission it is my clinical judgment that the patient will require inpatient hospital care spanning beyond 2 midnights from the point of admission due to high intensity of service, high risk for further  deterioration and high frequency of surveillance required.*  Author: Jonah Blue, MD 01/28/2023 12:01 PM  For on call review [email or web address].

## 2023-01-28 NOTE — ED Triage Notes (Signed)
Pt also c/o chest pain.

## 2023-01-28 NOTE — ED Notes (Addendum)
ED TO INPATIENT HANDOFF REPORT  ED Nurse Name and Phone #: Maralyn Sago [phone]  S Name/Age/Gender Hector Gross 22 y.o. male Room/Bed: RESUSC/RESUSC  Code Status   Code Status: Prior  Home/SNF/Other Home Patient oriented to: self, place, time, and situation Is this baseline? Yes   Triage Complete: Triage complete  Chief Complaint Asthma exacerbation [J45.901]  Triage Note Pt to ED via EMS from home. Pt c/o SOB, pt has hx of asthma, pt used rescue inhalers and nebs tonight with no relief, pt low 90%s on RA, pt placed on CPAP with EMS.   EMS Vitals: 10 albuterol  0.5 atrovent 125 IV solumedrol 18 LAC 130/78 100% CPAP 24 RR 100 HR  Pt also c/o chest pain   Allergies No Known Allergies  Level of Care/Admitting Diagnosis ED Disposition     ED Disposition  Admit   Condition  --   Comment  Hospital [facility]: [facility] [[id number]]  Level of Care: Progressive [102]  Admit to Progressive based on following criteria: RESPIRATORY PROBLEMS hypoxemic/hypercapnic respiratory failure that is responsive to NIPPV (BiPAP) or High Flow Nasal Cannula (6-80 lpm). Frequent assessment/intervention, no > Q2 hrs < Q4 hrs, to maintain oxygenation and pulmonary hygiene.  May admit patient to [facility] or [facility] if equivalent level of care is available:: Yes  Covid Evaluation: Confirmed COVID Negative  Diagnosis: Asthma exacerbation [659935]  Admitting Physician: Jonah Blue [[id number]]  Attending Physician: Jonah Blue [[id number]]  Certification:: I certify this patient will need inpatient services for at least 2 midnights  Estimated Length of Stay: 2          B Medical/Surgery History Past Medical History:  Diagnosis Date   Asthma    History reviewed. No pertinent surgical history.   A IV Location/Drains/Wounds Patient Lines/Drains/Airways Status     Active Line/Drains/Airways     Name Placement date Placement time Site Days   Peripheral IV  01/28/23 18 G Left Antecubital 01/28/23  0535  Antecubital  less than 1            Intake/Output Last 24 hours No intake or output data in the 24 hours ending 01/28/23 0854  Labs/Imaging Results for orders placed or performed during the hospital encounter of 01/28/23 (from the past 48 hour(s))  Comprehensive metabolic panel     Status: Abnormal   Collection Time: 01/28/23  5:30 AM  Result Value Ref Range   Sodium 138 135 - 145 mmol/L   Potassium 3.5 3.5 - 5.1 mmol/L   Chloride 105 98 - 111 mmol/L   CO2 24 22 - 32 mmol/L   Glucose, Bld 115 (H) 70 - 99 mg/dL    Comment: Glucose reference range applies only to samples taken after fasting for at least 8 hours.   BUN 10 6 - 20 mg/dL   Creatinine, Ser 7.01 0.61 - 1.24 mg/dL   Calcium 9.4 8.9 - 77.9 mg/dL   Total Protein 7.5 6.5 - 8.1 g/dL   Albumin 3.9 3.5 - 5.0 g/dL   AST 27 15 - 41 U/L   ALT 31 0 - 44 U/L   Alkaline Phosphatase 60 38 - 126 U/L   Total Bilirubin 0.5 0.3 - 1.2 mg/dL   GFR, Estimated >39 >03 mL/min    Comment: (NOTE) Calculated using the CKD-EPI Creatinine Equation (2021)    Anion gap 9 5 - 15    Comment: Performed at [facility], [address], [place], [place] [address]  CBC  Status: Abnormal   Collection Time: 01/28/23  5:30 AM  Result Value Ref Range   WBC 8.7 4.0 - 10.5 K/uL   RBC 5.63 4.22 - 5.81 MIL/uL   Hemoglobin 16.0 13.0 - 17.0 g/dL   HCT 16.1 09.6 - 04.5 %   MCV 81.2 80.0 - 100.0 fL   MCH 28.4 26.0 - 34.0 pg   MCHC 35.0 30.0 - 36.0 g/dL   RDW 40.9 81.1 - 91.4 %   Platelets 403 (H) 150 - 400 K/uL   nRBC 0.0 0.0 - 0.2 %    Comment: Performed at [facility], [address] [place], [place] [address]  Resp panel by RT-PCR (RSV, Flu A&B, Covid) Anterior Nasal Swab     Status: None   Collection Time: 01/28/23  5:41 AM   Specimen: Anterior Nasal Swab  Result Value Ref Range   SARS Coronavirus 2 by RT PCR NEGATIVE NEGATIVE   Influenza A by PCR NEGATIVE NEGATIVE   Influenza B by  PCR NEGATIVE NEGATIVE    Comment: (NOTE) The Xpert Xpress SARS-CoV-2/FLU/RSV plus assay is intended as an aid in the diagnosis of influenza from Nasopharyngeal swab specimens and should not be used as a sole basis for treatment. Nasal washings and aspirates are unacceptable for Xpert Xpress SARS-CoV-2/FLU/RSV testing.  Fact Sheet for Patients: BloggerCourse.com  Fact Sheet for Healthcare Providers: SeriousBroker.it  This test is not yet approved or cleared by the [place] FDA and has been authorized for detection and/or diagnosis of SARS-CoV-2 by FDA under an Emergency Use Authorization (EUA). This EUA will remain in effect (meaning this test can be used) for the duration of the COVID-19 declaration under Section 564(b)(1) of the Act, 21 U.S.C. section 360bbb-3(b)(1), unless the authorization is terminated or revoked.     Resp Syncytial Virus by PCR NEGATIVE NEGATIVE    Comment: (NOTE) Fact Sheet for Patients: BloggerCourse.com  Fact Sheet for Healthcare Providers: SeriousBroker.it  This test is not yet approved or cleared by the [place] FDA and has been authorized for detection and/or diagnosis of SARS-CoV-2 by FDA under an Emergency Use Authorization (EUA). This EUA will remain in effect (meaning this test can be used) for the duration of the COVID-19 declaration under Section 564(b)(1) of the Act, 21 U.S.C. section 360bbb-3(b)(1), unless the authorization is terminated or revoked.  Performed at [facility], [address] [place], [place] [address]   I-Stat venous blood gas, ED     Status: Abnormal   Collection Time: 01/28/23  5:49 AM  Result Value Ref Range   pH, Ven 7.273 7.25 - 7.43   pCO2, Ven 54.1 44 - 60 mmHg   pO2, Ven 123 (H) 32 - 45 mmHg   Bicarbonate 25.0 20.0 - 28.0 mmol/L   TCO2 27 22 - 32 mmol/L   O2 Saturation 98 %   Acid-base deficit  3.0 (H) 0.0 - 2.0 mmol/L   Sodium 140 135 - 145 mmol/L   Potassium 3.5 3.5 - 5.1 mmol/L   Calcium, Ion 1.29 1.15 - 1.40 mmol/L   HCT 47.0 39.0 - 52.0 %   Hemoglobin 16.0 13.0 - 17.0 g/dL   Sample type VENOUS    DG Chest Port 1 View  Result Date: 01/28/2023 CLINICAL DATA:  Shortness of breath EXAM: PORTABLE CHEST 1 VIEW COMPARISON:  01/22/2023 FINDINGS: Artifact from EKG leads. Normal heart size and mediastinal contours. No acute infiltrate or edema. No effusion or pneumothorax. No acute osseous findings. IMPRESSION: No active  disease. Electronically Signed   By: Tiburcio Pea M.D.   On: 01/28/2023 06:02    Pending Labs Unresulted Labs (From admission, onward)    None       Vitals/Pain Today's Vitals   01/28/23 0700 01/28/23 0715 01/28/23 0717 01/28/23 0730  BP: (!) 142/86 124/71  131/88  Pulse: (!) 120 99  (!) 106  Resp: 20 20  20   Temp:      TempSrc:      SpO2: 100% 99%  96%  PainSc:   0-No pain     Isolation Precautions No active isolations  Medications Medications  albuterol (PROVENTIL) (2.5 MG/3ML) 0.083% nebulizer solution (2.5 mg Nebulization Given 01/28/23 0541)  magnesium sulfate IVPB 2 g 50 mL (0 g Intravenous Stopped 01/28/23 0647)  ipratropium (ATROVENT) nebulizer solution 1 mg (0.5 mg Nebulization Given 01/28/23 0541)    Mobility walks     Focused Assessments Pulmonary Assessment Handoff:  Lung sounds: Bilateral Breath Sounds: Diminished O2 Device: (S) Bi-PAP      R Recommendations: See Admitting Provider Note  Report given to:   Additional Notes:

## 2023-01-28 NOTE — Progress Notes (Signed)
MEWS Progress Note  Patient Details Name: Hector Gross MRN: [id number] DOB: 05-Sep-2001 Today's Date: 01/28/2023   MEWS Flowsheet Documentation:  Assess: MEWS Score Temp: 98.2 F (36.8 C) BP: (!) 130/94 MAP (mmHg): 106 Pulse Rate: (!) 121 ECG Heart Rate: (!) 121 Resp: 18 Level of Consciousness: Alert SpO2: 96 % O2 Device: Bi-PAP Patient Activity (if Appropriate): In bed FiO2 (%): 30 % Assess: MEWS Score MEWS Temp: 0 MEWS Systolic: 0 MEWS Pulse: 2 MEWS RR: 0 MEWS LOC: 0 MEWS Score: 2 MEWS Score Color: Yellow Assess: SIRS CRITERIA SIRS Temperature : 0 SIRS Respirations : 0 SIRS Pulse: 1 SIRS WBC: 0 SIRS Score Sum : 1 SIRS Temperature : 0 SIRS Pulse: 1 SIRS Respirations : 0 SIRS WBC: 0 SIRS Score Sum : 1 Assess: if the MEWS score is Yellow or Red Were vital signs taken at a resting state?: Yes Focused Assessment: Change from prior assessment (see assessment flowsheet) Does the patient meet 2 or more of the SIRS criteria?: No MEWS guidelines implemented : Yes, yellow Treat MEWS Interventions: Considered administering scheduled or prn medications/treatments as ordered Take Vital Signs Increase Vital Sign Frequency : Yellow: Q2hr x1, continue Q4hrs until patient remains green for 12hrs Escalate MEWS: Escalate: Yellow: Discuss with charge nurse and consider notifying provider and/or RRT Notify: Charge Nurse/RN Name of Charge Nurse/RN Notified: Devin Provider Notification Provider Name/Title: Jonah Blue MD Date Provider Notified: 01/28/23 Time Provider Notified: [id number] Method of Notification: Page Notification Reason: New onset of dysrhythmia Type of New Onset of Dysrhythmia: Sinus tachycardia      Elvera Maria 01/28/2023, 12:27 PM

## 2023-01-28 NOTE — ED Provider Notes (Signed)
[place] EMERGENCY DEPARTMENT AT [facility] Provider Note  CSN: [id number] Arrival date & time: 01/28/23 7414  Chief Complaint(s) Respiratory Distress  HPI Hector Gross is a 22 y.o. male with a past medical history listed below including asthma who presents to the emergency department with 1 day of gradually worsening shortness of breath consistent with asthma exacerbation not relieved with his home breathing treatments.  Patient endorses dry and productive cough.  No fevers or chills.  No recent infections.  Patient states that he no longer smokes.  No associated chest pain.  No other physical complaints.  Patient brought in by EMS who noted patient had significant wheezing and decreased air movement.  He was given Solu-Medrol, 10 mg of albuterol and placed on CPAP.  The history is provided by the patient.    Past Medical History Past Medical History:  Diagnosis Date   Asthma    Patient Active Problem List   Diagnosis Date Noted   Acute asthma exacerbation 06/24/2021   Asthma exacerbation 06/23/2021   Uncomplicated asthma    Asthma, chronic, unspecified asthma severity, with acute exacerbation 04/23/2021   Chronic asthma with acute exacerbation 04/23/2021   Home Medication(s) Prior to Admission medications   Medication Sig Start Date End Date Taking? Authorizing Provider  albuterol (VENTOLIN HFA) 108 (90 Base) MCG/ACT inhaler Inhale 1-2 puffs into the lungs every 6 (six) hours as needed for wheezing or shortness of breath. 01/07/23  Yes Tanda Rockers A, DO  triamcinolone ointment (KENALOG) 0.1 % Apply 1 Application topically 2 (two) times daily as needed. Patient taking differently: Apply 1 Application topically 2 (two) times daily as needed (for itching). 04/02/22  Yes [name], NP  albuterol (PROVENTIL) (2.5 MG/3ML) 0.083% nebulizer solution Take 3 mLs (2.5 mg total) by nebulization every 6 (six) hours as needed for wheezing or shortness of breath. Patient  not taking: Reported on 01/28/2023 05/13/22   [name], NP  cetirizine (ZYRTEC ALLERGY) 10 MG tablet Take 1 tablet (10 mg total) by mouth daily. Patient not taking: Reported on 01/28/2023 01/07/23 02/06/23  Tanda Rockers A, DO  fluticasone furoate-vilanterol (BREO ELLIPTA) 100-25 MCG/ACT AEPB Inhale 1 puff into the lungs daily. Patient not taking: Reported on 01/28/2023 11/27/22   Hoy Register, MD  montelukast (SINGULAIR) 10 MG tablet Take 1 tablet (10 mg total) by mouth at bedtime. Patient not taking: Reported on 01/28/2023 11/09/22   [name], NP  Respiratory Therapy Supplies (NEBULIZER MASK ADULT) MISC 1 Device by Does not apply route daily as needed (for cough, wheezing or shortness of breath). J45.41 06/07/21   [name], NP  Respiratory Therapy Supplies (NEBULIZER) DEVI 1 Device by Does not apply route daily as needed (for cough, wheezing or shortness of breath). J45.41 06/07/21   [name], NP  Allergies Patient has no known allergies.  Review of Systems Review of Systems As noted in HPI  Physical Exam Vital Signs  I have reviewed the triage vital signs BP 134/79   Pulse (!) 110   Temp (!) 97.1 F (36.2 C) (Oral)   Resp 20   SpO2 100%   Physical Exam Vitals reviewed.  Constitutional:      General: He is not in acute distress.    Appearance: He is well-developed. He is not diaphoretic.  HENT:     Head: Normocephalic and atraumatic.     Nose: Nose normal.  Eyes:     General: No scleral icterus.       Right eye: No discharge.        Left eye: No discharge.     Conjunctiva/sclera: Conjunctivae normal.     Pupils: Pupils are equal, round, and reactive to light.  Cardiovascular:     Rate and Rhythm: Normal rate and regular rhythm.     Heart sounds: No murmur heard.    No friction rub. No gallop.  Pulmonary:     Effort:  Tachypnea, accessory muscle usage and respiratory distress present.     Breath sounds: Decreased air movement present. No stridor. Wheezing present.  Abdominal:     General: There is no distension.     Palpations: Abdomen is soft.     Tenderness: There is no abdominal tenderness.  Musculoskeletal:        General: No tenderness.     Cervical back: Normal range of motion and neck supple.  Skin:    General: Skin is warm and dry.     Findings: No erythema or rash.  Neurological:     Mental Status: He is alert and oriented to person, place, and time.     ED Results and Treatments Labs (all labs ordered are listed, but only abnormal results are displayed) Labs Reviewed  COMPREHENSIVE METABOLIC PANEL - Abnormal; Notable for the following components:      Result Value   Glucose, Bld 115 (*)    All other components within normal limits  CBC - Abnormal; Notable for the following components:   Platelets 403 (*)    All other components within normal limits  I-STAT VENOUS BLOOD GAS, ED - Abnormal; Notable for the following components:   pO2, Ven 123 (*)    Acid-base deficit 3.0 (*)    All other components within normal limits  RESP PANEL BY RT-PCR (RSV, FLU A&B, COVID)  [id number]                                                                                                                         EKG  EKG Interpretation  Date/Time:  Monday January 28 2023 05:30:37 EDT Ventricular Rate:  110 PR Interval:  59 QRS Duration: 83 QT Interval:  325 QTC Calculation: 440 R Axis:   78 Text Interpretation: Sinus tachycardia Right atrial enlargement RSR' in V1 or V2, probably normal  variant Artifact in lead(s) II III aVF V4 V5 V6 Confirmed by Drema Pry [phone]) on 01/28/2023 5:43:53 AM       Radiology DG Chest [place] 1 View  Result Date: 01/28/2023 CLINICAL DATA:  Shortness of breath EXAM: PORTABLE CHEST 1 VIEW COMPARISON:  01/22/2023 FINDINGS: Artifact from EKG leads. Normal heart size and  mediastinal contours. No acute infiltrate or edema. No effusion or pneumothorax. No acute osseous findings. IMPRESSION: No active disease. Electronically Signed   By: Tiburcio Pea M.D.   On: 01/28/2023 06:02    Medications Ordered in ED Medications  albuterol (PROVENTIL) (2.5 MG/3ML) 0.083% nebulizer solution (2.5 mg Nebulization Given 01/28/23 0541)  magnesium sulfate IVPB 2 g 50 mL (0 g Intravenous Stopped 01/28/23 0647)  ipratropium (ATROVENT) nebulizer solution 1 mg (0.5 mg Nebulization Given 01/28/23 0541)                                                                                                                                     Procedures .Critical Care  Performed by: Nira Conn, MD Authorized by: Nira Conn, MD   Critical care provider statement:    Critical care time (minutes):  45   Critical care time was exclusive of:  Separately billable procedures and treating other patients   Critical care was necessary to treat or prevent imminent or life-threatening deterioration of the following conditions:  Respiratory failure   Critical care was time spent personally by me on the following activities:  Development of treatment plan with patient or surrogate, discussions with consultants, evaluation of patient's response to treatment, examination of patient, obtaining history from patient or surrogate, review of old charts, re-evaluation of patient's condition, pulse oximetry, ordering and review of radiographic studies, ordering and review of laboratory studies and ordering and performing treatments and interventions   Care discussed with: admitting provider     (including critical care time)  Medical Decision Making / ED Course  Click here for ABCD2, HEART and other calculators  Medical Decision Making Amount and/or Complexity of Data Reviewed Labs: ordered. Decision-making details documented in ED Course. Radiology: ordered and independent  interpretation performed. Decision-making details documented in ED Course. ECG/medicine tests: ordered and independent interpretation performed. Decision-making details documented in ED Course.  Risk Prescription drug management. Decision regarding hospitalization.    This patient presents to the ED for concern of SOB, this involves an extensive number of treatment options, and is a complaint that carries with it a high risk of complications and morbidity. The differential diagnosis includes but not limited to asthma exacerbation, will assess for evidence of pneumonia, pneumothorax.  Less suspicious for PE.  Initial intervention:  Transition to BiPAP. Continuous albuterol and Atrovent magnesium  Work up: [employer] and/or imaging independently interpreted by me) EKG without acute dysrhythmias or blocks CBC without leukocytosis or anemia Metabolic panel without significant electrolyte derangements or renal sufficiency Viral panel negative for COVID/influenza/RSV VBG  without respiratory acidosis Chest x-ray without evidence of pneumonia, pneumothorax, pulmonary edema or pleural effusions  Reassessment: Reports significant improvement in his work of breathing and shortness of breath. He has better air movement. Will attempt to wean off of BiPAP.  Still requires admission.       Final Clinical Impression(s) / ED Diagnoses Final diagnoses:  Moderate persistent asthma with exacerbation           This chart was dictated using voice recognition software.  Despite best efforts to proofread,  errors can occur which can change the documentation meaning.    Nira Conn, MD 01/28/23 [phone]

## 2023-01-29 DIAGNOSIS — J4541 Moderate persistent asthma with (acute) exacerbation: Secondary | ICD-10-CM

## 2023-01-29 LAB — BASIC METABOLIC PANEL
Anion gap: 11 (ref 5–15)
BUN: 10 mg/dL (ref 6–20)
CO2: 23 mmol/L (ref 22–32)
Calcium: 9.8 mg/dL (ref 8.9–10.3)
Chloride: 101 mmol/L (ref 98–111)
Creatinine, Ser: 1 mg/dL (ref 0.61–1.24)
GFR, Estimated: >60 mL/min (ref >60)
Glucose, Bld: 146 mg/dL — ABNORMAL HIGH (ref 70–99)
Potassium: 4.3 mmol/L (ref 3.5–5.1)
Sodium: 135 mmol/L (ref 135–145)

## 2023-01-29 LAB — CBC
HCT: 45.4 % (ref 39.0–52.0)
Hemoglobin: 15.9 g/dL (ref 13.0–17.0)
MCH: 28.3 pg (ref 26.0–34.0)
MCHC: 35 g/dL (ref 30.0–36.0)
MCV: 80.8 fL (ref 80.0–100.0)
Platelets: 416 10*3/uL — ABNORMAL HIGH (ref 150–400)
RBC: 5.62 MIL/uL (ref 4.22–5.81)
RDW: 13.2 % (ref 11.5–15.5)
WBC: 10.7 10*3/uL — ABNORMAL HIGH (ref 4.0–10.5)
nRBC: 0 % (ref 0.0–0.2)

## 2023-01-29 MED ORDER — METHYLPREDNISOLONE SODIUM SUCC 40 MG IJ SOLR
40.0000 mg | Freq: Two times a day (BID) | INTRAMUSCULAR | Status: DC
  Administered 2023-01-29 – 2023-01-31 (×4): 40 mg via INTRAVENOUS
  Filled 2023-01-29 (×4): qty 1

## 2023-01-29 NOTE — Progress Notes (Signed)
PROGRESS NOTE                                                                                                                                                                                                             Patient Demographics:    Hector Gross, is a 22 y.o. male, DOB - 05-05-2001, EKC:[id number]  Outpatient Primary MD for the patient is [name], NP    LOS - 1  Admit date - 01/28/2023    Chief Complaint  Patient presents with   Respiratory Distress       Brief Narrative (HPI from H&P)   22 y.o. male with medical history significant of asthma presenting with SOB.   He reports several days of wheezing and SOB.  No recent URI symptoms, no fever, no sick contacts.  He does periodically smoke marijuana, last within the week.  He does have seasonal allergies that may be contributing.  He has had previous hospitalizations, no intubations.  He was taken off BIPAP prior to my arrival but reports ongoing SOB, feels like he needs to go back on BIPAP.  In review of medications, he has not been taking antihistamine, controller inhaler, or Singulair.    Subjective:    Leonia Reeves today has, No headache, No chest pain, No abdominal pain - No Nausea, No new weakness tingling or numbness, improved cough and shortness of breath   Assessment  & Plan :   Acute hypoxic respiratory failure caused by acute on chronic persistent asthma exacerbation due to medication noncompliance. He stopped taking Zyrtec, Breo, and Singulair and continues to smoke marijuana, developed asthma exacerbation with acute hypoxic respiratory failure requiring BiPAP in the ER, much improved now continue steroids, continue nebulizer treatments and oxygen supplementation currently on 3 L nasal cannula oxygen.  Encouraged to sit in chair in daytime use I-S and flutter valve, advance activity titrate down oxygen.  Counseled on medication compliance.   Likely discharge home in 1 to 2 days with outpatient pulmonary follow-up.  Marijuana use.  Counseled to quit.      Condition - Fair  Family Communication  :  None  Code Status :  Full  Consults  :  None  PUD Prophylaxis :    Procedures  :          Disposition Plan  :  Status is: Inpatient   DVT Prophylaxis  :    enoxaparin (LOVENOX) injection 40 mg Start: 01/28/23 1000    Lab Results  Component Value Date   PLT 416 (H) 01/29/2023    Diet :  Diet Order             Diet regular Room service appropriate? Yes; Fluid consistency: Thin  Diet effective now                    Inpatient Medications  Scheduled Meds:  enoxaparin (LOVENOX) injection  40 mg Subcutaneous Q24H   fluticasone furoate-vilanterol  1 puff Inhalation Daily   ipratropium-albuterol  3 mL Nebulization Q6H   loratadine  10 mg Oral Daily   methylPREDNISolone (SOLU-MEDROL) injection  80 mg Intravenous BID   montelukast  10 mg Oral QHS   sodium chloride flush  3 mL Intravenous Q12H   Continuous Infusions: PRN Meds:.acetaminophen **OR** acetaminophen, albuterol, bisacodyl, guaiFENesin, hydrALAZINE, ondansetron **OR** ondansetron (ZOFRAN) IV, polyethylene glycol  Antibiotics  :    Anti-infectives (From admission, onward)    None         Objective:   Vitals:   01/28/23 2031 01/29/23 0000 01/29/23 0103 01/29/23 0400  BP:  112/60  123/71  Pulse:  (!) 101  67  Resp:  20  14  Temp:  98.1 F (36.7 C)  98.3 F (36.8 C)  TempSrc:  Oral  Oral  SpO2: 97% 90% 98% 92%    Wt Readings from Last 3 Encounters:  01/22/23 81.6 kg  01/07/23 81.6 kg  12/27/22 81.6 kg     Intake/Output Summary (Last 24 hours) at 01/29/2023 0748 Last data filed at 01/28/2023 2200 Gross per 24 hour  Intake 3 ml  Output 1200 ml  Net -1197 ml     Physical Exam  Awake Alert, No new F.N deficits, Normal affect [place].AT,PERRAL Supple Neck, No JVD,   Symmetrical Chest wall movement, Mod air movement  bilaterally, mild wheezing RRR,No Gallops,Rubs or new Murmurs,  +ve B.Sounds, Abd Soft, No tenderness,   No Cyanosis, Clubbing or edema         Data Review:    Recent Labs  Lab 01/28/23 0530 01/28/23 0549 01/29/23 0321  WBC 8.7  --  10.7*  HGB 16.0 16.0 15.9  HCT 45.7 47.0 45.4  PLT 403*  --  416*  MCV 81.2  --  80.8  MCH 28.4  --  28.3  MCHC 35.0  --  35.0  RDW 13.0  --  13.2    Recent Labs  Lab 01/28/23 0530 01/28/23 0549 01/29/23 0321  NA 138 140 135  K 3.5 3.5 4.3  CL 105  --  101  CO2 24  --  23  ANIONGAP 9  --  11  GLUCOSE 115*  --  146*  BUN 10  --  10  CREATININE 1.09  --  1.00  AST 27  --   --   ALT 31  --   --   ALKPHOS 60  --   --   BILITOT 0.5  --   --   ALBUMIN 3.9  --   --   CALCIUM 9.4  --  9.8      Recent Labs  Lab 01/28/23 0530 01/29/23 0321  CALCIUM 9.4 9.8     Micro Results Recent Results (from the past 240 hour(s))  Resp panel by RT-PCR (RSV, Flu A&B, Covid) Anterior Nasal Swab     Status: None  Collection Time: 01/28/23  5:41 AM   Specimen: Anterior Nasal Swab  Result Value Ref Range Status   SARS Coronavirus 2 by RT PCR NEGATIVE NEGATIVE Final   Influenza A by PCR NEGATIVE NEGATIVE Final   Influenza B by PCR NEGATIVE NEGATIVE Final    Comment: (NOTE) The Xpert Xpress SARS-CoV-2/FLU/RSV plus assay is intended as an aid in the diagnosis of influenza from Nasopharyngeal swab specimens and should not be used as a sole basis for treatment. Nasal washings and aspirates are unacceptable for Xpert Xpress SARS-CoV-2/FLU/RSV testing.  Fact Sheet for Patients: BloggerCourse.com  Fact Sheet for Healthcare Providers: SeriousBroker.it  This test is not yet approved or cleared by the [place] FDA and has been authorized for detection and/or diagnosis of SARS-CoV-2 by FDA under an Emergency Use Authorization (EUA). This EUA will remain in effect (meaning this test can be used)  for the duration of the COVID-19 declaration under Section 564(b)(1) of the Act, 21 U.S.C. section 360bbb-3(b)(1), unless the authorization is terminated or revoked.     Resp Syncytial Virus by PCR NEGATIVE NEGATIVE Final    Comment: (NOTE) Fact Sheet for Patients: BloggerCourse.com  Fact Sheet for Healthcare Providers: SeriousBroker.it  This test is not yet approved or cleared by the [place] FDA and has been authorized for detection and/or diagnosis of SARS-CoV-2 by FDA under an Emergency Use Authorization (EUA). This EUA will remain in effect (meaning this test can be used) for the duration of the COVID-19 declaration under Section 564(b)(1) of the Act, 21 U.S.C. section 360bbb-3(b)(1), unless the authorization is terminated or revoked.  Performed at [facility], [address], [place], [place] [address]     Radiology Reports DG Chest [place] 1 View  Result Date: 01/28/2023 CLINICAL DATA:  Shortness of breath EXAM: PORTABLE CHEST 1 VIEW COMPARISON:  01/22/2023 FINDINGS: Artifact from EKG leads. Normal heart size and mediastinal contours. No acute infiltrate or edema. No effusion or pneumothorax. No acute osseous findings. IMPRESSION: No active disease. Electronically Signed   By: Tiburcio Pea M.D.   On: 01/28/2023 06:02      Signature  -   Susa Raring M.D on 01/29/2023 at 7:48 AM   -  To page go to [email or web address]

## 2023-01-29 NOTE — Progress Notes (Signed)
RT note. Patient currently on 4 L [place] sat 93%. Bipap not required at this time, no labored breathing noted. Servo air on stand by if patient needs later. RT will continue to monitor.    01/29/23 0751  BiPAP/CPAP/SIPAP  BiPAP/CPAP/SIPAP Pt Type Adult (on stand by)  BiPAP/CPAP/SIPAP SERVO (Air)  BiPAP/CPAP /SiPAP Vitals  Pulse Rate 85  Resp 15  SpO2 93 %  Bilateral Breath Sounds Inspiratory wheezes  MEWS Score/Color  MEWS Score 0  MEWS Score Color Green

## 2023-01-29 NOTE — Plan of Care (Signed)

## 2023-01-30 MED ORDER — IPRATROPIUM-ALBUTEROL 0.5-2.5 (3) MG/3ML IN SOLN
3.0000 mL | Freq: Two times a day (BID) | RESPIRATORY_TRACT | Status: DC
  Administered 2023-01-30 – 2023-01-31 (×2): 3 mL via RESPIRATORY_TRACT
  Filled 2023-01-30 (×2): qty 3

## 2023-01-30 NOTE — Progress Notes (Signed)
PROGRESS NOTE                                                                                                                                                                                                             Patient Demographics:    Hector Gross, is a 22 y.o. male, DOB - 11/02/2000, BJY:[id number]  Outpatient Primary MD for the patient is [name], NP    LOS - 2  Admit date - 01/28/2023    Chief Complaint  Patient presents with   Respiratory Distress       Brief Narrative (HPI from H&P)   22 y.o. male with medical history significant of asthma presenting with SOB.   He reports several days of wheezing and SOB.  No recent URI symptoms, no fever, no sick contacts.  He does periodically smoke marijuana, last within the week.  He does have seasonal allergies that may be contributing.  He has had previous hospitalizations, no intubations.  He was taken off BIPAP prior to my arrival but reports ongoing SOB, feels like he needs to go back on BIPAP.  In review of medications, he has not been taking antihistamine, controller inhaler, or Singulair.    Subjective:    Hector Gross today reports dyspnea has improved, but he still reports dyspnea with minimal activity, reports cough has improved as well.     Assessment  & Plan :   Acute hypoxic respiratory failure caused by acute on chronic persistent asthma exacerbation due to medication noncompliance.  - He stopped taking Zyrtec, Breo, and Singulair as he does not have Medicaid anymore, and continues to smoke marijuana. -Did require BiPAP upon admission while in ED, but no BiPAP requirement over last 24 hours, he is on room air today -continue with current dose IV Solu-Medrol, hopefully can transition to prednisone taper in 24 hours  Marijuana use.  Counseled to quit.      Condition - Fair  Family Communication  :  None  Code Status :  Full  Consults  :   None  PUD Prophylaxis :    Procedures  :          Disposition Plan  :    Status is: Inpatient   DVT Prophylaxis  :    enoxaparin (LOVENOX) injection 40 mg Start: 01/28/23 1000  Lab Results  Component Value Date   PLT 416 (H) 01/29/2023    Diet :  Diet Order             Diet regular Room service appropriate? Yes; Fluid consistency: Thin  Diet effective now                    Inpatient Medications  Scheduled Meds:  enoxaparin (LOVENOX) injection  40 mg Subcutaneous Q24H   fluticasone furoate-vilanterol  1 puff Inhalation Daily   ipratropium-albuterol  3 mL Nebulization BID   loratadine  10 mg Oral Daily   methylPREDNISolone (SOLU-MEDROL) injection  40 mg Intravenous BID   montelukast  10 mg Oral QHS   sodium chloride flush  3 mL Intravenous Q12H   Continuous Infusions: PRN Meds:.acetaminophen **OR** acetaminophen, albuterol, bisacodyl, guaiFENesin, hydrALAZINE, ondansetron **OR** ondansetron (ZOFRAN) IV, polyethylene glycol  Antibiotics  :    Anti-infectives (From admission, onward)    None         Objective:   Vitals:   01/29/23 2000 01/30/23 0000 01/30/23 0341 01/30/23 1209  BP:  130/73    Pulse: 88 86    Resp: 16 20    Temp:  98 F (36.7 C) 97.6 F (36.4 C)   TempSrc:  Oral Oral   SpO2: 97% 93%  94%    Wt Readings from Last 3 Encounters:  01/22/23 81.6 kg  01/07/23 81.6 kg  12/27/22 81.6 kg     Intake/Output Summary (Last 24 hours) at 01/30/2023 1546 Last data filed at 01/29/2023 1945 Gross per 24 hour  Intake 250 ml  Output 500 ml  Net -250 ml     Physical Exam   Awake Alert, Oriented X 3, No new F.N deficits, Normal affect Symmetrical Chest wall movement, moderate air entry bilaterally with mild wheezing RRR,No Gallops,Rubs or new Murmurs, No Parasternal Heave +ve B.Sounds, Abd Soft, No tenderness, No rebound - guarding or rigidity. No Cyanosis, Clubbing or edema, No new Rash or bruise       Data Review:     Recent Labs  Lab 01/28/23 0530 01/28/23 0549 01/29/23 0321  WBC 8.7  --  10.7*  HGB 16.0 16.0 15.9  HCT 45.7 47.0 45.4  PLT 403*  --  416*  MCV 81.2  --  80.8  MCH 28.4  --  28.3  MCHC 35.0  --  35.0  RDW 13.0  --  13.2    Recent Labs  Lab 01/28/23 0530 01/28/23 0549 01/29/23 0321  NA 138 140 135  K 3.5 3.5 4.3  CL 105  --  101  CO2 24  --  23  ANIONGAP 9  --  11  GLUCOSE 115*  --  146*  BUN 10  --  10  CREATININE 1.09  --  1.00  AST 27  --   --   ALT 31  --   --   ALKPHOS 60  --   --   BILITOT 0.5  --   --   ALBUMIN 3.9  --   --   CALCIUM 9.4  --  9.8      Recent Labs  Lab 01/28/23 0530 01/29/23 0321  CALCIUM 9.4 9.8     Micro Results Recent Results (from the past 240 hour(s))  Resp panel by RT-PCR (RSV, Flu A&B, Covid) Anterior Nasal Swab     Status: None   Collection Time: 01/28/23  5:41 AM   Specimen: Anterior Nasal Swab  Result Value  Ref Range Status   SARS Coronavirus 2 by RT PCR NEGATIVE NEGATIVE Final   Influenza A by PCR NEGATIVE NEGATIVE Final   Influenza B by PCR NEGATIVE NEGATIVE Final    Comment: (NOTE) The Xpert Xpress SARS-CoV-2/FLU/RSV plus assay is intended as an aid in the diagnosis of influenza from Nasopharyngeal swab specimens and should not be used as a sole basis for treatment. Nasal washings and aspirates are unacceptable for Xpert Xpress SARS-CoV-2/FLU/RSV testing.  Fact Sheet for Patients: BloggerCourse.com  Fact Sheet for Healthcare Providers: SeriousBroker.it  This test is not yet approved or cleared by the [place] FDA and has been authorized for detection and/or diagnosis of SARS-CoV-2 by FDA under an Emergency Use Authorization (EUA). This EUA will remain in effect (meaning this test can be used) for the duration of the COVID-19 declaration under Section 564(b)(1) of the Act, 21 U.S.C. section 360bbb-3(b)(1), unless the authorization is terminated  or revoked.     Resp Syncytial Virus by PCR NEGATIVE NEGATIVE Final    Comment: (NOTE) Fact Sheet for Patients: BloggerCourse.com  Fact Sheet for Healthcare Providers: SeriousBroker.it  This test is not yet approved or cleared by the [place] FDA and has been authorized for detection and/or diagnosis of SARS-CoV-2 by FDA under an Emergency Use Authorization (EUA). This EUA will remain in effect (meaning this test can be used) for the duration of the COVID-19 declaration under Section 564(b)(1) of the Act, 21 U.S.C. section 360bbb-3(b)(1), unless the authorization is terminated or revoked.  Performed at [facility], [address], [place], [place] [address]     Radiology Reports DG Chest [place] 1 View  Result Date: 01/28/2023 CLINICAL DATA:  Shortness of breath EXAM: PORTABLE CHEST 1 VIEW COMPARISON:  01/22/2023 FINDINGS: Artifact from EKG leads. Normal heart size and mediastinal contours. No acute infiltrate or edema. No effusion or pneumothorax. No acute osseous findings. IMPRESSION: No active disease. Electronically Signed   By: Tiburcio Pea M.D.   On: 01/28/2023 06:02      Signature  -   Huey Bienenstock M.D on 01/30/2023 at 3:46 PM   -  To page go to [email or web address]

## 2023-01-30 NOTE — Progress Notes (Signed)
Pt currently on 2L [place], BIPAP not required at this time, no distress noted, vitals are stable.

## 2023-01-31 ENCOUNTER — Other Ambulatory Visit (HOSPITAL_COMMUNITY): Payer: Self-pay

## 2023-01-31 MED ORDER — MONTELUKAST SODIUM 10 MG PO TABS
10.0000 mg | ORAL_TABLET | Freq: Every day | ORAL | 0 refills | Status: DC
Start: 2023-01-31 — End: 2023-03-30
  Filled 2023-01-31: qty 30, 30d supply, fill #0

## 2023-01-31 MED ORDER — ALBUTEROL SULFATE HFA 108 (90 BASE) MCG/ACT IN AERS
1.0000 | INHALATION_SPRAY | Freq: Four times a day (QID) | RESPIRATORY_TRACT | 1 refills | Status: DC | PRN
  Filled 2023-01-31: qty 18, 30d supply, fill #0

## 2023-01-31 MED ORDER — FLUTICASONE-SALMETEROL 115-21 MCG/ACT IN AERO
2.0000 | INHALATION_SPRAY | Freq: Two times a day (BID) | RESPIRATORY_TRACT | 0 refills | Status: DC
  Filled 2023-01-31: qty 8, 30d supply, fill #0

## 2023-01-31 MED ORDER — PREDNISONE 10 MG (21) PO TBPK
ORAL_TABLET | ORAL | 0 refills | Status: DC
  Filled 2023-01-31: qty 21, 6d supply, fill #0

## 2023-01-31 NOTE — Plan of Care (Signed)

## 2023-01-31 NOTE — Discharge Instructions (Signed)
Follow with Primary MD [name], NP in 7 days   Get CBC, CMP, checked  by Primary MD next visit.    Activity: As tolerated with Full fall precautions use walker/cane & assistance as needed   Disposition Home    Diet: Regular Diet   On your next visit with your primary care physician please Get Medicines reviewed and adjusted.   Please request your Prim.MD to go over all Hospital Tests and Procedure/Radiological results at the follow up, please get all Hospital records sent to your Prim MD by signing hospital release before you go home.   If you experience worsening of your admission symptoms, develop shortness of breath, life threatening emergency, suicidal or homicidal thoughts you must seek medical attention immediately by calling 911 or calling your MD immediately  if symptoms less severe.  You Must read complete instructions/literature along with all the possible adverse reactions/side effects for all the Medicines you take and that have been prescribed to you. Take any new Medicines after you have completely understood and accpet all the possible adverse reactions/side effects.   Do not drive, operating heavy machinery, perform activities at heights, swimming or participation in water activities or provide baby sitting services if your were admitted for syncope or siezures until you have seen by Primary MD or a Neurologist and advised to do so again.  Do not drive when taking Pain medications.    Do not take more than prescribed Pain, Sleep and Anxiety Medications  Special Instructions: If you have smoked or chewed Tobacco  in the last 2 yrs please stop smoking, stop any regular Alcohol  and or any Recreational drug use.  Wear Seat belts while driving.   Please note  You were cared for by a hospitalist during your hospital stay. If you have any questions about your discharge medications or the care you received while you were in the hospital after you are  discharged, you can call the unit and asked to speak with the hospitalist on call if the hospitalist that took care of you is not available. Once you are discharged, your primary care physician will handle any further medical issues. Please note that NO REFILLS for any discharge medications will be authorized once you are discharged, as it is imperative that you return to your primary care physician (or establish a relationship with a primary care physician if you do not have one) for your aftercare needs so that they can reassess your need for medications and monitor your lab values.

## 2023-01-31 NOTE — Discharge Summary (Signed)
Physician Discharge Summary  Hector Gross:[id number] DOB: 05-Mar-2001 DOA: 01/28/2023  PCP: [name], NP  Admit date: 01/28/2023 Discharge date: 01/31/2023  Admitted From: (Home) Disposition:  (Home)  Recommendations for Outpatient Follow-up:  Follow up with PCP in 1-2 weeks Please obtain BMP/CBC in one week     Discharge Condition: (Stable) CODE STATUS: (FULL) Diet recommendation:  Regular  Brief/Interim Summary:  22 y.o. male with medical history significant of asthma presenting with SOB.   He reports several days of wheezing and SOB.  No recent URI symptoms, no fever, no sick contacts.  He does periodically smoke marijuana, last within the week.  He does have seasonal allergies that may be contributing.  He has had previous hospitalizations, no intubations.  He was taken off BIPAP prior to my arrival but reports ongoing SOB, feels like he needs to go back on BIPAP.  In review of medications, he has not been taking antihistamine, controller inhaler, or Singulair.   Acute hypoxic respiratory failure caused by acute on chronic persistent asthma exacerbation due to medication noncompliance.  - He stopped taking Zyrtec, Breo, and Singulair as he does not have Medicaid anymore, and continues to smoke marijuana. -Did require BiPAP upon admission while in ED, her BiPAP requirement after that, as well he is on room air currently, he was treated with steroids, nebulizer treatment, this morning with no wheezing, no dyspnea or hypoxia, he will be discharged on prednisone taper, as well social worker consulted , and will provide match letter, will he will be discharged on Advair, and Ventolin inhaler and Singulair.     Marijuana use.  Counseled to quit.  Discharge Diagnoses:  Active Problems:   Chronic asthma with acute exacerbation   Marijuana abuse    Discharge Instructions  Discharge Instructions     Diet - low sodium heart healthy   Complete by: As directed     Discharge instructions   Complete by: As directed    Follow with Primary MD [name], NP in 7 days   Get CBC, CMP, checked  by Primary MD next visit.    Activity: As tolerated with Full fall precautions use walker/cane & assistance as needed   Disposition Home    Diet: Regular diet   On your next visit with your primary care physician please Get Medicines reviewed and adjusted.   Please request your Prim.MD to go over all Hospital Tests and Procedure/Radiological results at the follow up, please get all Hospital records sent to your Prim MD by signing hospital release before you go home.   If you experience worsening of your admission symptoms, develop shortness of breath, life threatening emergency, suicidal or homicidal thoughts you must seek medical attention immediately by calling 911 or calling your MD immediately  if symptoms less severe.  You Must read complete instructions/literature along with all the possible adverse reactions/side effects for all the Medicines you take and that have been prescribed to you. Take any new Medicines after you have completely understood and accpet all the possible adverse reactions/side effects.   Do not drive, operating heavy machinery, perform activities at heights, swimming or participation in water activities or provide baby sitting services if your were admitted for syncope or siezures until you have seen by Primary MD or a Neurologist and advised to do so again.  Do not drive when taking Pain medications.    Do not take more than prescribed Pain, Sleep and Anxiety Medications  Special Instructions: If you  have smoked or chewed Tobacco  in the last 2 yrs please stop smoking, stop any regular Alcohol  and or any Recreational drug use.  Wear Seat belts while driving.   Please note  You were cared for by a hospitalist during your hospital stay. If you have any questions about your discharge medications or the care you received  while you were in the hospital after you are discharged, you can call the unit and asked to speak with the hospitalist on call if the hospitalist that took care of you is not available. Once you are discharged, your primary care physician will handle any further medical issues. Please note that NO REFILLS for any discharge medications will be authorized once you are discharged, as it is imperative that you return to your primary care physician (or establish a relationship with a primary care physician if you do not have one) for your aftercare needs so that they can reassess your need for medications and monitor your lab values.   Increase activity slowly   Complete by: As directed       Allergies as of 01/31/2023   No Known Allergies      Medication List     STOP taking these medications    Breo Ellipta 100-25 MCG/ACT Aepb Generic drug: fluticasone furoate-vilanterol   cetirizine 10 MG tablet Commonly known as: ZyrTEC Allergy   Community education officer Adult Misc   triamcinolone ointment 0.1 % Commonly known as: KENALOG       TAKE these medications    albuterol 108 (90 Base) MCG/ACT inhaler Commonly known as: VENTOLIN HFA Inhale 1-2 puffs into the lungs every 6 (six) hours as needed for wheezing or shortness of breath. What changed: Another medication with the same name was removed. Continue taking this medication, and follow the directions you see here.   fluticasone-salmeterol 115-21 MCG/ACT inhaler Commonly known as: Advair HFA Inhale 2 puffs into the lungs 2 (two) times daily.   montelukast 10 MG tablet Commonly known as: Singulair Take 1 tablet (10 mg total) by mouth at bedtime.   predniSONE 10 MG (21) Tbpk tablet Commonly known as: STERAPRED UNI-PAK 21 TAB Use per package instruction        No Known Allergies  Consultations: none   Procedures/Studies: DG Chest Port 1 View  Result Date: 01/28/2023 CLINICAL DATA:  Shortness of breath EXAM:  PORTABLE CHEST 1 VIEW COMPARISON:  01/22/2023 FINDINGS: Artifact from EKG leads. Normal heart size and mediastinal contours. No acute infiltrate or edema. No effusion or pneumothorax. No acute osseous findings. IMPRESSION: No active disease. Electronically Signed   By: Tiburcio Pea M.D.   On: 01/28/2023 06:02   DG Chest 2 View  Result Date: 01/22/2023 CLINICAL DATA:  Cough and shortness of breath. EXAM: CHEST - 2 VIEW COMPARISON:  Chest radiograph 01/07/2023 FINDINGS: The cardiomediastinal contours are within normal limits. The lungs are clear. No pneumothorax or pleural effusion. No acute finding in the visualized skeleton. IMPRESSION: No active cardiopulmonary disease. Electronically Signed   By: Emmaline Kluver M.D.   On: 01/22/2023 17:57   DG Chest Portable 1 View  Result Date: 01/07/2023 CLINICAL DATA:  Chest pain for 3 days EXAM: PORTABLE CHEST 1 VIEW COMPARISON:  X-ray 12/21/2022 FINDINGS: No consolidation, pneumothorax or effusion. No edema. Normal cardiopericardial silhouette. Overlapping cardiac leads. IMPRESSION: No acute cardiopulmonary disease Electronically Signed   By: Karen Kays M.D.   On: 01/07/2023 13:27   (Echo, Carotid, EGD, Colonoscopy, ERCP)  Subjective: Denies any dyspnea or cough today  Discharge Exam: Vitals:   01/31/23 0522 01/31/23 0828  BP: 122/72   Pulse: 65   Resp: 17   Temp: 97.9 F (36.6 C)   SpO2: 93% 94%   Vitals:   01/30/23 2025 01/30/23 2108 01/31/23 0522 01/31/23 0828  BP:  117/79 122/72   Pulse: 89 98 65   Resp: 20  17   Temp:  97.7 F (36.5 C) 97.9 F (36.6 C)   TempSrc:  Oral Oral   SpO2: 96% 95% 93% 94%    General: Pt is alert, awake, not in acute distress Cardiovascular: RRR, S1/S2 +, no rubs, no gallops Respiratory: CTA bilaterally, no wheezing, no rhonchi Abdominal: Soft, NT, ND, bowel sounds + Extremities: no edema, no cyanosis    The results of significant diagnostics from this hospitalization (including imaging,  microbiology, ancillary and laboratory) are listed below for reference.     Microbiology: Recent Results (from the past 240 hour(s))  Resp panel by RT-PCR (RSV, Flu A&B, Covid) Anterior Nasal Swab     Status: None   Collection Time: 01/28/23  5:41 AM   Specimen: Anterior Nasal Swab  Result Value Ref Range Status   SARS Coronavirus 2 by RT PCR NEGATIVE NEGATIVE Final   Influenza A by PCR NEGATIVE NEGATIVE Final   Influenza B by PCR NEGATIVE NEGATIVE Final    Comment: (NOTE) The Xpert Xpress SARS-CoV-2/FLU/RSV plus assay is intended as an aid in the diagnosis of influenza from Nasopharyngeal swab specimens and should not be used as a sole basis for treatment. Nasal washings and aspirates are unacceptable for Xpert Xpress SARS-CoV-2/FLU/RSV testing.  Fact Sheet for Patients: BloggerCourse.com  Fact Sheet for Healthcare Providers: SeriousBroker.it  This test is not yet approved or cleared by the [place] FDA and has been authorized for detection and/or diagnosis of SARS-CoV-2 by FDA under an Emergency Use Authorization (EUA). This EUA will remain in effect (meaning this test can be used) for the duration of the COVID-19 declaration under Section 564(b)(1) of the Act, 21 U.S.C. section 360bbb-3(b)(1), unless the authorization is terminated or revoked.     Resp Syncytial Virus by PCR NEGATIVE NEGATIVE Final    Comment: (NOTE) Fact Sheet for Patients: BloggerCourse.com  Fact Sheet for Healthcare Providers: SeriousBroker.it  This test is not yet approved or cleared by the [place] FDA and has been authorized for detection and/or diagnosis of SARS-CoV-2 by FDA under an Emergency Use Authorization (EUA). This EUA will remain in effect (meaning this test can be used) for the duration of the COVID-19 declaration under Section 564(b)(1) of the Act, 21 U.S.C. section  360bbb-3(b)(1), unless the authorization is terminated or revoked.  Performed at [facility], [address] [place], [place] [address]      Labs: BNP (last 3 results) No results for input(s): "BNP" in the last 8760 hours. Basic Metabolic Panel: Recent Labs  Lab 01/28/23 0530 01/28/23 0549 01/29/23 0321  NA 138 140 135  K 3.5 3.5 4.3  CL 105  --  101  CO2 24  --  23  GLUCOSE 115*  --  146*  BUN 10  --  10  CREATININE 1.09  --  1.00  CALCIUM 9.4  --  9.8   Liver Function Tests: Recent Labs  Lab 01/28/23 0530  AST 27  ALT 31  ALKPHOS 60  BILITOT 0.5  PROT 7.5  ALBUMIN 3.9   No results for input(s): "LIPASE", "AMYLASE" in the  last 168 hours. No results for input(s): "AMMONIA" in the last 168 hours. CBC: Recent Labs  Lab 01/28/23 0530 01/28/23 0549 01/29/23 0321  WBC 8.7  --  10.7*  HGB 16.0 16.0 15.9  HCT 45.7 47.0 45.4  MCV 81.2  --  80.8  PLT 403*  --  416*   Cardiac Enzymes: No results for input(s): "CKTOTAL", "CKMB", "CKMBINDEX", "TROPONINI" in the last 168 hours. BNP: Invalid input(s): "POCBNP" CBG: No results for input(s): "GLUCAP" in the last 168 hours. D-Dimer No results for input(s): "DDIMER" in the last 72 hours. Hgb A1c No results for input(s): "HGBA1C" in the last 72 hours. Lipid Profile No results for input(s): "CHOL", "HDL", "LDLCALC", "TRIG", "CHOLHDL", "LDLDIRECT" in the last 72 hours. Thyroid function studies No results for input(s): "TSH", "T4TOTAL", "T3FREE", "THYROIDAB" in the last 72 hours.  Invalid input(s): "FREET3" Anemia work up No results for input(s): "VITAMINB12", "FOLATE", "FERRITIN", "TIBC", "IRON", "RETICCTPCT" in the last 72 hours. Urinalysis No results found for: "COLORURINE", "APPEARANCEUR", "LABSPEC", "PHURINE", "GLUCOSEU", "HGBUR", "BILIRUBINUR", "KETONESUR", "PROTEINUR", "UROBILINOGEN", "NITRITE", "LEUKOCYTESUR" Sepsis Labs Recent Labs  Lab 01/28/23 0530 01/29/23 0321  WBC 8.7 10.7*    Microbiology Recent Results (from the past 240 hour(s))  Resp panel by RT-PCR (RSV, Flu A&B, Covid) Anterior Nasal Swab     Status: None   Collection Time: 01/28/23  5:41 AM   Specimen: Anterior Nasal Swab  Result Value Ref Range Status   SARS Coronavirus 2 by RT PCR NEGATIVE NEGATIVE Final   Influenza A by PCR NEGATIVE NEGATIVE Final   Influenza B by PCR NEGATIVE NEGATIVE Final    Comment: (NOTE) The Xpert Xpress SARS-CoV-2/FLU/RSV plus assay is intended as an aid in the diagnosis of influenza from Nasopharyngeal swab specimens and should not be used as a sole basis for treatment. Nasal washings and aspirates are unacceptable for Xpert Xpress SARS-CoV-2/FLU/RSV testing.  Fact Sheet for Patients: BloggerCourse.com  Fact Sheet for Healthcare Providers: SeriousBroker.it  This test is not yet approved or cleared by the [place] FDA and has been authorized for detection and/or diagnosis of SARS-CoV-2 by FDA under an Emergency Use Authorization (EUA). This EUA will remain in effect (meaning this test can be used) for the duration of the COVID-19 declaration under Section 564(b)(1) of the Act, 21 U.S.C. section 360bbb-3(b)(1), unless the authorization is terminated or revoked.     Resp Syncytial Virus by PCR NEGATIVE NEGATIVE Final    Comment: (NOTE) Fact Sheet for Patients: BloggerCourse.com  Fact Sheet for Healthcare Providers: SeriousBroker.it  This test is not yet approved or cleared by the [place] FDA and has been authorized for detection and/or diagnosis of SARS-CoV-2 by FDA under an Emergency Use Authorization (EUA). This EUA will remain in effect (meaning this test can be used) for the duration of the COVID-19 declaration under Section 564(b)(1) of the Act, 21 U.S.C. section 360bbb-3(b)(1), unless the authorization is terminated or revoked.  Performed at  [facility], [address] [place], [place] [address]      Time coordinating discharge: Over 30 minutes  SIGNED:   Huey Bienenstock, MD  [facility] 01/31/2023, 11:31 AM Pager   If 7PM-7AM, please contact night-coverage [email or web address]

## 2023-01-31 NOTE — TOC Transition Note (Signed)
Transition of Care [facility]) - CM/SW Discharge Note   Patient Details  Name: Hector Gross MRN: [id number] Date of Birth: Aug 18, 2001  [facility]) CM/SW Contact:  Gordy Clement, RN Phone Number: 01/31/2023, 11:41 AM   Clinical Narrative:     [facility] consulted for [facility] letter needs.  Patient has MATCH and TOC is aware. Patient will DC to home with Family            Patient Goals and CMS Choice      Discharge Placement                         Discharge Plan and Services Additional resources added to the After Visit Summary for                                       Social Determinants of Health (SDOH) Interventions SDOH Screenings   Depression (PHQ2-9): Low Risk  (11/09/2022)  Tobacco Use: Low Risk  (01/28/2023)     Readmission Risk Interventions     No data to display

## 2023-01-31 NOTE — Progress Notes (Signed)
AVS reviewed with pt by this Clinical research associate, primary RN Leavy Cella. All questions answered by this writer, and pt verbalized understanding of discharge instructions. Pt left unit in NAD, via pt's mother's private vehicle.

## 2023-02-04 ENCOUNTER — Telehealth: Payer: Self-pay

## 2023-02-04 NOTE — Transitions of Care (Post Inpatient/ED Visit) (Signed)
   02/04/2023  Name: Hector Gross MRN: [id number] DOB: 2000-12-19  Today's TOC FU Call Status: Today's TOC FU Call Status:: Unsuccessul Call (1st Attempt) Unsuccessful Call (1st Attempt) Date: 02/04/23  Attempted to reach the patient regarding the most recent Inpatient/ED visit.  Follow Up Plan: Additional outreach attempts will be made to reach the patient to complete the Transitions of Care (Post Inpatient/ED visit) call.   Signature  Audie Box

## 2023-02-05 ENCOUNTER — Telehealth: Payer: Self-pay

## 2023-02-05 NOTE — Transitions of Care (Post Inpatient/ED Visit) (Signed)
   02/05/2023  Name: Hector Gross MRN: [id number] DOB: [birth year]  Today's TOC FU Call Status: Today's TOC FU Call Status:: Unsuccessful Call (2nd Attempt) Unsuccessful Call (1st Attempt) Date: 02/04/23 Unsuccessful Call (2nd Attempt) Date: 02/05/23  Attempted to reach the patient regarding the most recent Inpatient/ED visit.  Follow Up Plan: Additional outreach attempts will be made to reach the patient to complete the Transitions of Care (Post Inpatient/ED visit) call.   Signature   Robyne Peers, RN

## 2023-02-06 ENCOUNTER — Telehealth: Payer: Self-pay

## 2023-02-06 NOTE — Transitions of Care (Post Inpatient/ED Visit) (Signed)
   02/06/2023  Name: Hector Gross MRN: [id number] DOB: 16-Nov-2000  Today's TOC FU Call Status: Today's TOC FU Call Status:: Unsuccessful Call (3rd Attempt) Unsuccessful Call (1st Attempt) Date: 02/04/23 Unsuccessful Call (2nd Attempt) Date: 02/05/23 Unsuccessful Call (3rd Attempt) Date: 02/06/23  Attempted to reach the patient regarding the most recent Inpatient/ED visit.  Follow Up Plan: No further outreach attempts will be made at this time. We have been unable to contact the patient.  Letter sent to patient requesting he contact [facility] to schedule a follow up appointment as we have not been able to reach him.   Signature   Robyne Peers, RN

## 2023-02-19 ENCOUNTER — Encounter: Payer: Self-pay | Admitting: Nurse Practitioner

## 2023-02-20 ENCOUNTER — Other Ambulatory Visit: Payer: Self-pay | Admitting: Nurse Practitioner

## 2023-02-20 DIAGNOSIS — L209 Atopic dermatitis, unspecified: Secondary | ICD-10-CM

## 2023-02-20 MED ORDER — TRIAMCINOLONE ACETONIDE 0.1 % EX CREA
1.0000 | TOPICAL_CREAM | Freq: Two times a day (BID) | CUTANEOUS | 0 refills | Status: DC
Start: 2023-02-20 — End: 2024-04-02
  Filled 2023-02-20: qty 60, 30d supply, fill #0

## 2023-02-21 ENCOUNTER — Other Ambulatory Visit: Payer: Self-pay

## 2023-02-28 ENCOUNTER — Other Ambulatory Visit: Payer: Self-pay

## 2023-03-20 ENCOUNTER — Other Ambulatory Visit: Payer: Self-pay

## 2023-03-20 ENCOUNTER — Encounter (HOSPITAL_COMMUNITY): Payer: Self-pay | Admitting: Emergency Medicine

## 2023-03-20 ENCOUNTER — Emergency Department (HOSPITAL_COMMUNITY)
Admission: EM | Admit: 2023-03-20 | Discharge: 2023-03-21 | Disposition: A | Discharge status: Home / Self Care | Payer: Medicaid Other | Attending: Emergency Medicine | Admitting: Emergency Medicine

## 2023-03-20 DIAGNOSIS — R0602 Shortness of breath: Secondary | ICD-10-CM | POA: Diagnosis not present

## 2023-03-20 DIAGNOSIS — R0600 Dyspnea, unspecified: Secondary | ICD-10-CM | POA: Diagnosis not present

## 2023-03-20 DIAGNOSIS — F17200 Nicotine dependence, unspecified, uncomplicated: Secondary | ICD-10-CM | POA: Insufficient documentation

## 2023-03-20 DIAGNOSIS — R519 Headache, unspecified: Secondary | ICD-10-CM | POA: Insufficient documentation

## 2023-03-20 DIAGNOSIS — J45909 Unspecified asthma, uncomplicated: Secondary | ICD-10-CM | POA: Diagnosis not present

## 2023-03-20 DIAGNOSIS — J45901 Unspecified asthma with (acute) exacerbation: Secondary | ICD-10-CM | POA: Insufficient documentation

## 2023-03-20 DIAGNOSIS — Z7952 Long term (current) use of systemic steroids: Secondary | ICD-10-CM | POA: Insufficient documentation

## 2023-03-20 DIAGNOSIS — Z7951 Long term (current) use of inhaled steroids: Secondary | ICD-10-CM | POA: Diagnosis not present

## 2023-03-20 DIAGNOSIS — J4541 Moderate persistent asthma with (acute) exacerbation: Secondary | ICD-10-CM | POA: Insufficient documentation

## 2023-03-20 DIAGNOSIS — R059 Cough, unspecified: Secondary | ICD-10-CM | POA: Diagnosis not present

## 2023-03-20 NOTE — ED Triage Notes (Signed)
Pt in with "HA x 1wk and asthma problems since yesterday". States his home Albuterol just ran out. Does endorse daily smoking

## 2023-03-21 ENCOUNTER — Encounter (HOSPITAL_COMMUNITY): Payer: Self-pay

## 2023-03-21 ENCOUNTER — Emergency Department (HOSPITAL_COMMUNITY)
Admission: EM | Admit: 2023-03-21 | Discharge: 2023-03-21 | Disposition: A | Discharge status: Home / Self Care | Payer: Medicaid Other | Source: Home / Self Care | Attending: Emergency Medicine | Admitting: Emergency Medicine

## 2023-03-21 ENCOUNTER — Other Ambulatory Visit: Payer: Self-pay

## 2023-03-21 ENCOUNTER — Emergency Department (HOSPITAL_COMMUNITY): Payer: Medicaid Other

## 2023-03-21 DIAGNOSIS — J45909 Unspecified asthma, uncomplicated: Secondary | ICD-10-CM | POA: Diagnosis not present

## 2023-03-21 DIAGNOSIS — Z7951 Long term (current) use of inhaled steroids: Secondary | ICD-10-CM | POA: Insufficient documentation

## 2023-03-21 DIAGNOSIS — R0902 Hypoxemia: Secondary | ICD-10-CM | POA: Diagnosis not present

## 2023-03-21 DIAGNOSIS — R0602 Shortness of breath: Secondary | ICD-10-CM | POA: Diagnosis not present

## 2023-03-21 DIAGNOSIS — J4541 Moderate persistent asthma with (acute) exacerbation: Secondary | ICD-10-CM | POA: Insufficient documentation

## 2023-03-21 DIAGNOSIS — Z7952 Long term (current) use of systemic steroids: Secondary | ICD-10-CM | POA: Insufficient documentation

## 2023-03-21 DIAGNOSIS — I10 Essential (primary) hypertension: Secondary | ICD-10-CM | POA: Diagnosis not present

## 2023-03-21 DIAGNOSIS — R059 Cough, unspecified: Secondary | ICD-10-CM | POA: Diagnosis not present

## 2023-03-21 DIAGNOSIS — R0600 Dyspnea, unspecified: Secondary | ICD-10-CM | POA: Diagnosis not present

## 2023-03-21 LAB — CBC WITH DIFFERENTIAL/PLATELET
Abs Immature Granulocytes: 0.03 10*3/uL (ref 0.00–0.07)
Basophils Absolute: 0 10*3/uL (ref 0.0–0.1)
Basophils Relative: 0 %
Eosinophils Absolute: 0.2 10*3/uL (ref 0.0–0.5)
Eosinophils Relative: 3 %
HCT: 47.3 % (ref 39.0–52.0)
Hemoglobin: 16.1 g/dL (ref 13.0–17.0)
Immature Granulocytes: 0 %
Lymphocytes Relative: 14 %
Lymphs Abs: 1.1 10*3/uL (ref 0.7–4.0)
MCH: 27.7 pg (ref 26.0–34.0)
MCHC: 34 g/dL (ref 30.0–36.0)
MCV: 81.4 fL (ref 80.0–100.0)
Monocytes Absolute: 0.4 10*3/uL (ref 0.1–1.0)
Monocytes Relative: 5 %
Neutro Abs: 6.1 10*3/uL (ref 1.7–7.7)
Neutrophils Relative %: 78 %
Platelets: 410 10*3/uL — ABNORMAL HIGH (ref 150–400)
RBC: 5.81 MIL/uL (ref 4.22–5.81)
RDW: 13.1 % (ref 11.5–15.5)
WBC: 7.9 10*3/uL (ref 4.0–10.5)
nRBC: 0 % (ref 0.0–0.2)

## 2023-03-21 LAB — BASIC METABOLIC PANEL
Anion gap: 8 (ref 5–15)
BUN: 9 mg/dL (ref 6–20)
CO2: 26 mmol/L (ref 22–32)
Calcium: 9.7 mg/dL (ref 8.9–10.3)
Chloride: 103 mmol/L (ref 98–111)
Creatinine, Ser: 1.05 mg/dL (ref 0.61–1.24)
GFR, Estimated: >60 mL/min (ref >60)
Glucose, Bld: 93 mg/dL (ref 70–99)
Potassium: 3.8 mmol/L (ref 3.5–5.1)
Sodium: 137 mmol/L (ref 135–145)

## 2023-03-21 MED ORDER — ALBUTEROL SULFATE HFA 108 (90 BASE) MCG/ACT IN AERS
2.0000 | INHALATION_SPRAY | Freq: Once | RESPIRATORY_TRACT | Status: AC
  Administered 2023-03-21: 2 via RESPIRATORY_TRACT
  Filled 2023-03-21: qty 6.7

## 2023-03-21 MED ORDER — IBUPROFEN 400 MG PO TABS
600.0000 mg | ORAL_TABLET | Freq: Once | ORAL | Status: AC
  Administered 2023-03-21: 600 mg via ORAL
  Filled 2023-03-21: qty 1

## 2023-03-21 MED ORDER — DEXAMETHASONE 4 MG PO TABS
10.0000 mg | ORAL_TABLET | Freq: Once | ORAL | Status: AC
  Administered 2023-03-21: 10 mg via ORAL
  Filled 2023-03-21: qty 3

## 2023-03-21 MED ORDER — IPRATROPIUM-ALBUTEROL 0.5-2.5 (3) MG/3ML IN SOLN
3.0000 mL | Freq: Once | RESPIRATORY_TRACT | Status: AC
  Administered 2023-03-21: 3 mL via RESPIRATORY_TRACT
  Filled 2023-03-21: qty 3

## 2023-03-21 NOTE — ED Notes (Signed)
The pt is c/o  a headache and his asthma is acting up  because he has been out of his albuterol for several days  no audible wheezes  no acuter distress

## 2023-03-21 NOTE — Discharge Instructions (Signed)
Below is the max dose for your inhaler that you can use while you are at home.  If you use this dose it will make you jittery and feel a bit anxious. Use your inhaler every 4 hours(6 puffs) while awake, return for sudden worsening shortness of breath, or if you need to use your inhaler more often.

## 2023-03-21 NOTE — ED Triage Notes (Signed)
Hx of Asthma. SOB this morning. Inhaler at home not working. Pt RA sat per EMS 90 percent.expiratory wheezing noted per ems as well.  Pt given 5 albuterol and 2.5 atrovent given pta. VSS upon arrival per ems.

## 2023-03-21 NOTE — ED Provider Notes (Signed)
[place] EMERGENCY DEPARTMENT AT [facility] Provider Note   CSN: [id number] Arrival date & time: 03/20/23  2313     History  Chief Complaint  Patient presents with   Asthma   Headache    Hector Gross is a 22 y.o. male.  Patient presents to the emergency department complaining of 1 week of frontal headache described as mild to moderate in nature and worsening shortness of breath with wheezing beginning yesterday.  He states he ran out of home albuterol yesterday.  The patient is a daily smoker with history of marijuana usage.  Patient denies chest pain, abdominal pain, nausea, vomiting, fevers.  Past medical history significant for chronic asthma with frequent exacerbations  HPI     Home Medications Prior to Admission medications   Medication Sig Start Date End Date Taking? Authorizing Provider  albuterol (VENTOLIN HFA) 108 (90 Base) MCG/ACT inhaler Inhale 1-2 puffs into the lungs every 6 (six) hours as needed for wheezing or shortness of breath. 01/31/23   [name], [name], MD  fluticasone-salmeterol (ADVAIR HFA) 324-40 MCG/ACT inhaler Inhale 2 puffs into the lungs 2 (two) times daily. 01/31/23   [name], [name], MD  montelukast (SINGULAIR) 10 MG tablet Take 1 tablet (10 mg total) by mouth at bedtime. 01/31/23   [name], [name], MD  predniSONE (STERAPRED UNI-PAK 21 TAB) 10 MG (21) TBPK tablet Use per package instruction 01/31/23   [name], [name], MD  triamcinolone cream (KENALOG) 0.1 % Apply 1 Application topically 2 (two) times daily. 02/20/23   [name], NP      Allergies    Patient has no known allergies.    Review of Systems   Review of Systems  Physical Exam Updated Vital Signs BP 128/87   Pulse 76   Temp 99.1 F (37.3 C)   Resp 17   Wt 81.6 kg   SpO2 98%   BMI 24.41 kg/m  Physical Exam Vitals and nursing note reviewed.  Constitutional:      General: He is not in acute distress.    Appearance: He is well-developed.  HENT:      Head: Normocephalic and atraumatic.  Eyes:     Conjunctiva/sclera: Conjunctivae normal.  Cardiovascular:     Rate and Rhythm: Normal rate and regular rhythm.     Heart sounds: No murmur heard. Pulmonary:     Effort: Pulmonary effort is normal. No respiratory distress.     Breath sounds: Wheezing (Widespread expiratory wheezing) present.  Abdominal:     Palpations: Abdomen is soft.     Tenderness: There is no abdominal tenderness.  Musculoskeletal:        General: No swelling.     Cervical back: Neck supple.  Skin:    General: Skin is warm and dry.     Capillary Refill: Capillary refill takes less than 2 seconds.  Neurological:     Mental Status: He is alert.  Psychiatric:        Mood and Affect: Mood normal.     ED Results / Procedures / Treatments   Labs (all labs ordered are listed, but only abnormal results are displayed) Labs Reviewed  CBC WITH DIFFERENTIAL/PLATELET - Abnormal; Notable for the following components:      Result Value   Platelets 410 (*)    All other components within normal limits  BASIC METABOLIC PANEL    EKG None  Radiology DG Chest Port 1 View  Result Date: 03/21/2023 CLINICAL DATA:  Cough, dyspnea,  asthma. EXAM: PORTABLE CHEST 1 VIEW COMPARISON:  01/28/2023. FINDINGS: The heart size and mediastinal contours are within normal limits. Both lungs are clear. No acute osseous abnormality. IMPRESSION: No active disease. Electronically Signed   By: [name] M.D.   On: 03/21/2023 03:34    Procedures Procedures    Medications Ordered in ED Medications  ibuprofen (ADVIL) tablet 600 mg (600 mg Oral Given 03/21/23 0343)  ipratropium-albuterol (DUONEB) 0.5-2.5 (3) MG/3ML nebulizer solution 3 mL (3 mLs Nebulization Given 03/21/23 0342)    ED Course/ Medical Decision Making/ A&P                             Medical Decision Making Amount and/or Complexity of Data Reviewed Labs: ordered. Radiology: ordered.  Risk Prescription drug  management.   This patient presents to the ED for concern of shortness of breath, this involves an extensive number of treatment options, and is a complaint that carries with it a high risk of complications and morbidity.  The differential diagnosis includes asthma exacerbation, pneumonia, others   Co morbidities that complicate the patient evaluation  History of asthma exacerbation, current smoker   Additional history obtained:   External records from outside source obtained and reviewed including notes from admission from April 15, patient admitted for asthma exacerbation   Lab Tests:  I Ordered, and personally interpreted labs.  The pertinent results include: Unremarkable CBC, BMP   Imaging Studies ordered:  I ordered imaging studies including chest x-ray I independently visualized and interpreted imaging which showed no acute disease I agree with the radiologist interpretation  Problem List / ED Course / Critical interventions / Medication management   I ordered medication including DuoNeb for wheezing, ibuprofen for mild headache Reevaluation of the patient after these medicines showed that the patient improved I have reviewed the patients home medicines and have made adjustments as needed   Social Determinants of Health:  Patient has Medicaid for his primary insurance type   Test / Admission - Considered:  Patient's symptoms improved dramatically with 1 DuoNeb treatment.  Wheezing completely subsided.  Patient feels much better at this time.  No signs of infection or pneumonia.  I see no indication at this time to start the patient antibiotics.  Patient may resume his home medication regimen and follow-up with his primary care team.  Headache subsided with ibuprofen.  No indication for admission.  Discharge home.         Final Clinical Impression(s) / ED Diagnoses Final diagnoses:  Exacerbation of asthma, unspecified asthma severity, unspecified whether  persistent  Acute nonintractable headache, unspecified headache type    Rx / DC Orders ED Discharge Orders     None         Pamala Duffel 03/21/23 0540    Marily Memos, MD 03/21/23 2303

## 2023-03-21 NOTE — ED Provider Notes (Signed)
[place] EMERGENCY DE[facility]MENT AT [facility] Provider Note   CSN: [id number] Arrival date & time: 03/21/23  1106     History  Chief Complaint  Patient presents with   Shortness of Breath    Hector Gross is a 22 y.o. male.  22 yo M with a chief complaints of difficulty breathing.  He woke up this morning and noticed this.  Yesterday was fine.  Feels like his typical asthma exacerbation.  An ambulance was called and he received a DuoNeb en route with complete resolution.  He feels totally better now.  Tells me that he had run out of his rescue inhaler at home.  Tried inhaled steroid but without improvement.  Denies fevers.   Shortness of Breath      Home Medications Prior to Admission medications   Medication Sig Start Date End Date Taking? Authorizing Provider  albuterol (VENTOLIN HFA) 108 (90 Base) MCG/ACT inhaler Inhale 1-2 puffs into the lungs every 6 (six) hours as needed for wheezing or shortness of breath. 01/31/23   [name], [name], MD  fluticasone-salmeterol (ADVAIR HFA) 409-81 MCG/ACT inhaler Inhale 2 puffs into the lungs 2 (two) times daily. 01/31/23   [name], [name], MD  montelukast (SINGULAIR) 10 MG tablet Take 1 tablet (10 mg total) by mouth at bedtime. 01/31/23   [name], [name], MD  predniSONE (STERAPRED UNI-PAK 21 TAB) 10 MG (21) TBPK tablet Use per package instruction 01/31/23   [name], [name], MD  triamcinolone cream (KENALOG) 0.1 % Apply 1 Application topically 2 (two) times daily. 02/20/23   [name], NP      Allergies    Patient has no known allergies.    Review of Systems   Review of Systems  Respiratory:  Positive for shortness of breath.     Physical Exam Updated Vital Signs BP (!) 154/81   Pulse (!) 101   Temp 98.2 F (36.8 C) (Oral)   Resp 20   Ht 6' (1.829 m)   Wt 81.6 kg   SpO2 96%   BMI 24.41 kg/m  Physical Exam Vitals and nursing note reviewed.  Constitutional:      Appearance: He is  well-developed.  HENT:     Head: Normocephalic and atraumatic.  Eyes:     Pupils: Pupils are equal, round, and reactive to light.  Neck:     Vascular: No JVD.  Cardiovascular:     Rate and Rhythm: Normal rate and regular rhythm.     Heart sounds: No murmur heard.    No friction rub. No gallop.  Pulmonary:     Effort: No respiratory distress.     Breath sounds: No wheezing.     Comments: Clear lung sounds with good aeration Abdominal:     General: There is no distension.     Tenderness: There is no abdominal tenderness. There is no guarding or rebound.  Musculoskeletal:        General: Normal range of motion.     Cervical back: Normal range of motion and neck supple.  Skin:    Coloration: Skin is not pale.     Findings: No rash.  Neurological:     Mental Status: He is alert and oriented to person, place, and time.  Psychiatric:        Behavior: Behavior normal.     ED Results / Procedures / Treatments   Labs (all labs ordered are listed, but only abnormal results are displayed) Labs Reviewed - No  data to display  EKG EKG Interpretation  Date/Time:  Thursday March 21 2023 11:12:57 EDT Ventricular Rate:  105 PR Interval:  131 QRS Duration: 69 QT Interval:  328 QTC Calculation: 434 R Axis:   65 Text Interpretation: Sinus tachycardia RSR' in V1 or V2, probably normal variant Borderline T abnormalities, anterior leads No significant change since last tracing Confirmed by Melene Plan [phone]) on 03/21/2023 11:15:08 AM  Radiology DG Chest Port 1 View  Result Date: 03/21/2023 CLINICAL DATA:  Cough, dyspnea, asthma. EXAM: PORTABLE CHEST 1 VIEW COMPARISON:  01/28/2023. FINDINGS: The heart size and mediastinal contours are within normal limits. Both lungs are clear. No acute osseous abnormality. IMPRESSION: No active disease. Electronically Signed   By: [name] M.D.   On: 03/21/2023 03:34    Procedures Procedures    Medications Ordered in ED Medications  dexamethasone  (DECADRON) tablet 10 mg (has no administration in time range)  albuterol (VENTOLIN HFA) 108 (90 Base) MCG/ACT inhaler 2 puff (has no administration in time range)    ED Course/ Medical Decision Making/ A&P                             Medical Decision Making Risk Prescription drug management.   22 yo M with a chief complaints of shortness of breath.  Feels like his typical asthma exacerbation to him.   Patient was given a DuoNeb en route with EMS and feels completely better.  He had run out of his home albuterol inhaler.  Will give him 2 puffs here and let him take at home.  Decadron.  On my record review the patient actually was seen yesterday for this.  He an x-ray that independently interpreted without focal infiltrate or pneumothorax.  He had blood work that also was unremarkable.  Do not feel need to repeat.  PCP follow-up.  11:28 AM:  I have discussed the diagnosis/risks/treatment options with the patient.  Evaluation and diagnostic testing in the emergency department does not suggest an emergent condition requiring admission or immediate intervention beyond what has been performed at this time.  They will follow up with PCP. We also discussed returning to the ED immediately if new or worsening sx occur. We discussed the sx which are most concerning (e.g., sudden worsening pain, fever, inability to tolerate by mouth) that necessitate immediate return. Medications administered to the patient during their visit and any new prescriptions provided to the patient are listed below.  Medications given during this visit Medications  dexamethasone (DECADRON) tablet 10 mg (has no administration in time range)  albuterol (VENTOLIN HFA) 108 (90 Base) MCG/ACT inhaler 2 puff (has no administration in time range)     The patient appears reasonably screen and/or stabilized for discharge and I doubt any other medical condition or other [facility] requiring further screening, evaluation, or treatment in the ED  at this time prior to discharge.         Final Clinical Impression(s) / ED Diagnoses Final diagnoses:  Moderate persistent asthma with exacerbation    Rx / DC Orders ED Discharge Orders     None         Melene Plan, DO 03/21/23 1129

## 2023-03-21 NOTE — Discharge Instructions (Addendum)
You were evaluated today for wheezing.  Your symptoms were consistent with a mild asthma exacerbation.  Please follow-up with your primary care provider for further evaluation and management as needed.  Your headache was treated with ibuprofen.  If you develop any life-threatening symptoms please return to the emergency department.

## 2023-03-25 ENCOUNTER — Emergency Department (HOSPITAL_COMMUNITY): Payer: Medicaid Other

## 2023-03-25 ENCOUNTER — Emergency Department (HOSPITAL_COMMUNITY)
Admission: EM | Admit: 2023-03-25 | Discharge: 2023-03-25 | Discharge status: Against Medical Advice | Payer: Medicaid Other | Attending: Emergency Medicine | Admitting: Emergency Medicine

## 2023-03-25 DIAGNOSIS — J45909 Unspecified asthma, uncomplicated: Secondary | ICD-10-CM | POA: Diagnosis not present

## 2023-03-25 DIAGNOSIS — Z7951 Long term (current) use of inhaled steroids: Secondary | ICD-10-CM | POA: Diagnosis not present

## 2023-03-25 DIAGNOSIS — J45901 Unspecified asthma with (acute) exacerbation: Secondary | ICD-10-CM | POA: Diagnosis not present

## 2023-03-25 DIAGNOSIS — Z1152 Encounter for screening for COVID-19: Secondary | ICD-10-CM | POA: Insufficient documentation

## 2023-03-25 DIAGNOSIS — J4531 Mild persistent asthma with (acute) exacerbation: Secondary | ICD-10-CM | POA: Diagnosis not present

## 2023-03-25 DIAGNOSIS — R0602 Shortness of breath: Secondary | ICD-10-CM | POA: Diagnosis present

## 2023-03-25 LAB — RESP PANEL BY RT-PCR (RSV, FLU A&B, COVID)  RVPGX2
Influenza A by PCR: NEGATIVE
Influenza B by PCR: NEGATIVE
Resp Syncytial Virus by PCR: NEGATIVE
SARS Coronavirus 2 by RT PCR: NEGATIVE

## 2023-03-25 MED ORDER — IPRATROPIUM-ALBUTEROL 0.5-2.5 (3) MG/3ML IN SOLN
3.0000 mL | Freq: Once | RESPIRATORY_TRACT | Status: AC
  Administered 2023-03-25: 3 mL via RESPIRATORY_TRACT
  Filled 2023-03-25: qty 3

## 2023-03-25 MED ORDER — PREDNISONE 20 MG PO TABS
40.0000 mg | ORAL_TABLET | Freq: Once | ORAL | Status: AC
  Administered 2023-03-25: 40 mg via ORAL
  Filled 2023-03-25: qty 2

## 2023-03-25 NOTE — ED Notes (Signed)
RN informed that pt has left when neb treatment was completed.

## 2023-03-25 NOTE — ED Triage Notes (Signed)
Pt c/o asthma exacerbation. Pt states that this flair up started last night, no relief with inhaler. Pt c/o wheezing.

## 2023-03-25 NOTE — ED Provider Notes (Signed)
[place] EMERGENCY DEPARTMENT AT [facility] Provider Note   CSN: [id number] Arrival date & time: 03/25/23  1128     History  Chief Complaint  Patient presents with   Asthma    Hector Gross is a 22 y.o. male with asthma who presents with shortness of breath, wheezing, concern for asthma saturation.  States that started last night or 2 days ago.  He states he does not think his inhaler albuterol has been working at home.  Also has had a cough that is nonproductive, no hemoptysis.  Denies any fever/chills.  Endorses mild allover headache. Denies CP, leg swelling.    Asthma       Home Medications Prior to Admission medications   Medication Sig Start Date End Date Taking? Authorizing Provider  albuterol (VENTOLIN HFA) 108 (90 Base) MCG/ACT inhaler Inhale 1-2 puffs into the lungs every 6 (six) hours as needed for wheezing or shortness of breath. 01/31/23   [name], [name], MD  fluticasone-salmeterol (ADVAIR HFA) 409-81 MCG/ACT inhaler Inhale 2 puffs into the lungs 2 (two) times daily. 01/31/23   [name], [name], MD  montelukast (SINGULAIR) 10 MG tablet Take 1 tablet (10 mg total) by mouth at bedtime. 01/31/23   [name], [name], MD  predniSONE (STERAPRED UNI-PAK 21 TAB) 10 MG (21) TBPK tablet Use per package instruction 01/31/23   [name], [name], MD  triamcinolone cream (KENALOG) 0.1 % Apply 1 Application topically 2 (two) times daily. 02/20/23   [name], NP      Allergies    Patient has no known allergies.    Review of Systems   Review of Systems Review of systems Negative for f/c.  A 10 point review of systems was performed and is negative unless otherwise reported in HPI.  Physical Exam Updated Vital Signs BP 114/86 (BP Location: Left Arm)   Pulse 78   Temp 97.8 F (36.6 C)   Resp 16   Ht 6' (1.829 m)   Wt 81.6 kg   SpO2 95%   BMI 24.41 kg/m  Physical Exam General: Normal appearing male, lying in bed.  HEENT:  Sclera anicteric,  MMM, trachea midline.  Cardiology: RRR, no murmurs/rubs/gallops. BL radial and DP pulses equal bilaterally.  Resp: Normal respiratory rate and effort. Mild inspiratory and expiratory wheezing Abd: Soft, non-tender, non-distended. No rebound tenderness or guarding.  GU: Deferred. MSK: No peripheral edema or signs of trauma. Extremities without deformity or TTP.  Skin: warm, dry.  Neuro: A&Ox4, CNs II-XII grossly intact. MAEs. Sensation grossly intact.  Psych: Normal mood and affect.   ED Results / Procedures / Treatments   Labs (all labs ordered are listed, but only abnormal results are displayed) Labs Reviewed  RESP PANEL BY RT-PCR (RSV, FLU A&B, COVID)  [id number]    EKG None  Radiology DG Chest 2 View  Result Date: 03/25/2023 CLINICAL DATA:  Asthma exacerbation. EXAM: CHEST - 2 VIEW COMPARISON:  Chest radiographs 03/21/2023 and 01/28/2023 FINDINGS: Cardiac silhouette and mediastinal contours are within normal limits. The lungs are clear. No pleural effusion or pneumothorax. No acute skeletal abnormality. IMPRESSION: No active cardiopulmonary disease. Electronically Signed   By: Neita Garnet M.D.   On: 03/25/2023 12:47    Procedures Procedures    Medications Ordered in ED Medications  ipratropium-albuterol (DUONEB) 0.5-2.5 (3) MG/3ML nebulizer solution 3 mL (3 mLs Nebulization Given 03/25/23 1701)  predniSONE (DELTASONE) tablet 40 mg (40 mg Oral Given 03/25/23 1700)    ED Course/ Medical  Decision Making/ A&P                          Medical Decision Making Amount and/or Complexity of Data Reviewed Radiology: ordered. Decision-making details documented in ED Course.  Risk Prescription drug management.    This patient presents to the ED for concern of c/f asthma exacerbation, this involves an extensive number of treatment options, and is a complaint that carries with it a high risk of complications and morbidity. HDS, overall well-appearing  MDM:    Patient w/ wheezing  bilaterally, history of asthma. Likely asthma exacerbation. Consider viral URI trigger or lack of albuterol trigger.  No CP/signs or symptoms of DVT to indicate PE and w/ wheezing, less likely clinical scenario. No productive cough/f/c to suggest PNA. No pulm edema/pleural effusion/PNA on CXR.will give duonebs, prednisone and reevaluate.    Clinical Course as of 03/26/23 0001  Mon Mar 25, 2023  1638 DG Chest 2 View No active cardiopulmonary disease. [HN]    Clinical Course User Index [HN] Loetta Rough, MD    Imaging Studies ordered: I ordered imaging studies including CXR I independently visualized and interpreted imaging. I agree with the radiologist interpretation  Additional history obtained from chart review.    Social Determinants of Health: Patient lives independently  Disposition:  RN went to check on patient, patient had eloped from the ED after nebulizer treatment was complete.    Co morbidities that complicate the patient evaluation  Past Medical History:  Diagnosis Date   Asthma      Medicines Meds ordered this encounter  Medications   ipratropium-albuterol (DUONEB) 0.5-2.5 (3) MG/3ML nebulizer solution 3 mL   predniSONE (DELTASONE) tablet 40 mg    I have reviewed the patients home medicines and have made adjustments as needed  Problem List / ED Course: Problem List Items Addressed This Visit   None Visit Diagnoses     Exacerbation of asthma, unspecified asthma severity, unspecified whether persistent    -  Primary   Relevant Medications   ipratropium-albuterol (DUONEB) 0.5-2.5 (3) MG/3ML nebulizer solution 3 mL (Completed)   predniSONE (DELTASONE) tablet 40 mg (Completed)                   This note was created using dictation software, which may contain spelling or grammatical errors.    Loetta Rough, MD 03/26/23 0001

## 2023-03-30 ENCOUNTER — Other Ambulatory Visit: Payer: Self-pay

## 2023-03-30 ENCOUNTER — Encounter (HOSPITAL_COMMUNITY): Payer: Self-pay

## 2023-03-30 ENCOUNTER — Emergency Department (HOSPITAL_COMMUNITY)
Admission: EM | Admit: 2023-03-30 | Discharge: 2023-03-30 | Disposition: A | Discharge status: Home / Self Care | Payer: Medicaid Other | Attending: Emergency Medicine | Admitting: Emergency Medicine

## 2023-03-30 DIAGNOSIS — R519 Headache, unspecified: Secondary | ICD-10-CM | POA: Diagnosis not present

## 2023-03-30 DIAGNOSIS — Z7952 Long term (current) use of systemic steroids: Secondary | ICD-10-CM | POA: Diagnosis not present

## 2023-03-30 DIAGNOSIS — J45901 Unspecified asthma with (acute) exacerbation: Secondary | ICD-10-CM | POA: Insufficient documentation

## 2023-03-30 DIAGNOSIS — R0602 Shortness of breath: Secondary | ICD-10-CM | POA: Diagnosis not present

## 2023-03-30 DIAGNOSIS — Z7951 Long term (current) use of inhaled steroids: Secondary | ICD-10-CM | POA: Insufficient documentation

## 2023-03-30 DIAGNOSIS — J4541 Moderate persistent asthma with (acute) exacerbation: Secondary | ICD-10-CM

## 2023-03-30 MED ORDER — MONTELUKAST SODIUM 10 MG PO TABS
10.0000 mg | ORAL_TABLET | Freq: Every day | ORAL | 0 refills | Status: DC
Start: 2023-03-30 — End: 2023-05-19

## 2023-03-30 MED ORDER — DEXAMETHASONE SODIUM PHOSPHATE 10 MG/ML IJ SOLN
10.0000 mg | Freq: Once | INTRAMUSCULAR | Status: AC
  Administered 2023-03-30: 10 mg via INTRAMUSCULAR
  Filled 2023-03-30: qty 1

## 2023-03-30 MED ORDER — FLUTICASONE-SALMETEROL 115-21 MCG/ACT IN AERO: 2.0000 | INHALATION_SPRAY | Freq: Two times a day (BID) | RESPIRATORY_TRACT | 0 refills | Status: DC

## 2023-03-30 MED ORDER — ALBUTEROL SULFATE (2.5 MG/3ML) 0.083% IN NEBU
5.0000 mg | INHALATION_SOLUTION | Freq: Once | RESPIRATORY_TRACT | Status: AC
  Administered 2023-03-30: 5 mg via RESPIRATORY_TRACT
  Filled 2023-03-30: qty 6

## 2023-03-30 MED ORDER — IPRATROPIUM BROMIDE 0.02 % IN SOLN
0.5000 mg | Freq: Once | RESPIRATORY_TRACT | Status: AC
  Administered 2023-03-30: 0.5 mg via RESPIRATORY_TRACT
  Filled 2023-03-30: qty 2.5

## 2023-03-30 MED ORDER — NAPROXEN 250 MG PO TABS
500.0000 mg | ORAL_TABLET | Freq: Once | ORAL | Status: AC
  Administered 2023-03-30: 500 mg via ORAL
  Filled 2023-03-30: qty 2

## 2023-03-30 MED ORDER — ALBUTEROL SULFATE HFA 108 (90 BASE) MCG/ACT IN AERS
2.0000 | INHALATION_SPRAY | Freq: Once | RESPIRATORY_TRACT | Status: AC
  Administered 2023-03-30: 2 via RESPIRATORY_TRACT
  Filled 2023-03-30: qty 6.7

## 2023-03-30 MED ORDER — VENTOLIN HFA 108 (90 BASE) MCG/ACT IN AERS: 1.0000 | INHALATION_SPRAY | Freq: Four times a day (QID) | RESPIRATORY_TRACT | 0 refills | Status: DC | PRN

## 2023-03-30 NOTE — Discharge Instructions (Signed)
Use your prescribed inhalers and follow-up with your primary care doctor.  We recommend Tylenol or ibuprofen for management of any persistent headache.  Return for new or concerning symptoms.

## 2023-03-30 NOTE — ED Notes (Signed)
Help get patient on the monitor did EKG shown to Dr Denton Lank patient is resting with call bell in reach

## 2023-03-30 NOTE — ED Provider Notes (Signed)
EMERGENCY DEPARTMENT AT [facility] Provider Note   CSN: [id number] Arrival date & time: 03/30/23  1152     History  Chief Complaint  Patient presents with   Headache   Asthma    Hector Gross is a 22 y.o. male.  22 year old male presents to the emergency department for evaluation of headache.  He has been experiencing a throbbing headache in his temples fairly persistently over the past week.  He ran out of his inhaler yesterday and has also been noting some shortness of breath today.  Shortness of breath associated with wheezing.  He tried to coordinate follow-up with his primary care doctor, but is still waiting to hear back.  He has not had any fevers, head injury, vision loss, extremity numbness or paresthesias, extremity weakness, syncope.  The history is provided by the patient. No language interpreter was used.  Headache      Home Medications Prior to Admission medications   Medication Sig Start Date End Date Taking? Authorizing Provider  albuterol (VENTOLIN HFA) 108 (90 Base) MCG/ACT inhaler Inhale 1-2 puffs into the lungs every 6 (six) hours as needed for wheezing or shortness of breath. 03/30/23  Yes Antony Madura, PA-C  fluticasone-salmeterol (ADVAIR HFA) 295-28 MCG/ACT inhaler Inhale 2 puffs into the lungs 2 (two) times daily. 03/30/23   Antony Madura, PA-C  montelukast (SINGULAIR) 10 MG tablet Take 1 tablet (10 mg total) by mouth at bedtime. 03/30/23   Antony Madura, PA-C  predniSONE (STERAPRED UNI-PAK 21 TAB) 10 MG (21) TBPK tablet Use per package instruction 01/31/23   [name], [name], MD  triamcinolone cream (KENALOG) 0.1 % Apply 1 Application topically 2 (two) times daily. 02/20/23   [name], NP      Allergies    Patient has no known allergies.    Review of Systems   Review of Systems  Neurological:  Positive for headaches.  Ten systems reviewed and are negative for acute change, except as noted in the HPI.    Physical  Exam Updated Vital Signs BP 119/83   Pulse 73   Temp (!) 97.5 F (36.4 C)   Resp (!) 21   SpO2 97%   Physical Exam Vitals and nursing note reviewed.  Constitutional:      General: He is not in acute distress.    Appearance: He is well-developed. He is not diaphoretic.     Comments: Nontoxic-appearing and in no acute distress  HENT:     Head: Normocephalic and atraumatic.  Eyes:     General: No scleral icterus.    Extraocular Movements: Extraocular movements intact.     Conjunctiva/sclera: Conjunctivae normal.  Neck:     Comments: No meningismus Cardiovascular:     Rate and Rhythm: Normal rate and regular rhythm.     Pulses: Normal pulses.  Pulmonary:     Effort: Pulmonary effort is normal. No respiratory distress.     Comments: Expiratory wheezing noted in bilateral lower lung fields.  Mild dyspnea without tachypnea. Musculoskeletal:        General: Normal range of motion.     Cervical back: Normal range of motion.  Skin:    General: Skin is warm and dry.     Coloration: Skin is not pale.     Findings: No erythema or rash.  Neurological:     Mental Status: He is alert and oriented to person, place, and time.     Cranial Nerves: No cranial nerve deficit.  Motor: No weakness.     Coordination: Coordination normal.     Comments: GCS 15.  No focal neurologic deficits on exam.  Psychiatric:        Behavior: Behavior normal.     ED Results / Procedures / Treatments   Labs (all labs ordered are listed, but only abnormal results are displayed) Labs Reviewed - No data to display  EKG EKG Interpretation  Date/Time:  Saturday March 30 2023 12:16:10 EDT Ventricular Rate:  81 PR Interval:  145 QRS Duration: 86 QT Interval:  346 QTC Calculation: 402 R Axis:   77 Text Interpretation: Sinus arrhythmia Confirmed by Cathren Laine ([id number]) on 03/30/2023 12:27:42 PM  Radiology No results found.  Procedures Procedures    Medications Ordered in ED Medications   naproxen (NAPROSYN) tablet 500 mg (500 mg Oral Given 03/30/23 1240)  dexamethasone (DECADRON) injection 10 mg (10 mg Intramuscular Given 03/30/23 1241)  albuterol (PROVENTIL) (2.5 MG/3ML) 0.083% nebulizer solution 5 mg (5 mg Nebulization Given 03/30/23 1241)  ipratropium (ATROVENT) nebulizer solution 0.5 mg (0.5 mg Nebulization Given 03/30/23 1241)  albuterol (VENTOLIN HFA) 108 (90 Base) MCG/ACT inhaler 2 puff (2 puffs Inhalation Given 03/30/23 1335)    ED Course/ Medical Decision Making/ A&P Clinical Course as of 03/30/23 1441  Sat Mar 30, 2023  1329 Patient feeling better. Lungs clear on repeat auscultation. [KH]    Clinical Course User Index [KH] Antony Madura, PA-C                             Medical Decision Making Risk Prescription drug management.   This patient presents to the ED for concern of headache and SOB, this involves an extensive number of treatment options, and is a complaint that carries with it a high risk of complications and morbidity.  The differential diagnosis includes hypoxemia vs PNA vs viral illness vs acute bronchospasm   Co morbidities that complicate the patient evaluation  Asthma    Additional history obtained:  External records from outside source obtained and reviewed including last PCP appointment in Jan 2024   Cardiac Monitoring:  The patient was maintained on a cardiac monitor.  I personally viewed and interpreted the cardiac monitored which showed an underlying rhythm of: NSR   Medicines ordered and prescription drug management:  I ordered medication including Duoneb and Decadron for SOB/wheezing and Naproxen for headache  Reevaluation of the patient after these medicines showed that the patient improved I have reviewed the patients home medicines and have made adjustments as needed   Test Considered:  CXR   Problem List / ED Course:  With respect to headache, patient with no history of recent head injury or trauma.  No fever,  nuchal rigidity, meningismus to suggest meningitis.  Neurologic exam today is nonfocal.  On reassessment, the patient has had significant improvement in headache symptoms following NSAIDs.   Hx of asthma. SOB and wheezing since yesterday after running out of albuterol inhaler. No hypoxemia since ED arrival. No fevers, tachypnea, rales to suggest PNA. Doubt PTX, pleural effusion. Wheezing on initial exam improved on reassessment following Duoneb. Similar presentations in the past for asthma exacerbation, based on chart review.   Reevaluation:  After the interventions noted above, I reevaluated the patient and found that they have :improved   Social Determinants of Health:  Lives independently    Dispostion:  After consideration of the diagnostic results and the patients response to treatment, I feel that  the patent would benefit from close outpatient PCP follow up. Encouraged compliance with inhalers; refills sent to pharmacy. Return precautions discussed and provided. Patient discharged in stable condition with no unaddressed concerns.          Final Clinical Impression(s) / ED Diagnoses Final diagnoses:  Exacerbation of asthma, unspecified asthma severity, unspecified whether persistent  Temporal headache    Rx / DC Orders ED Discharge Orders          Ordered    albuterol (VENTOLIN HFA) 108 (90 Base) MCG/ACT inhaler  Every 6 hours PRN        03/30/23 1331    montelukast (SINGULAIR) 10 MG tablet  Daily at bedtime        03/30/23 1331    fluticasone-salmeterol (ADVAIR HFA) 115-21 MCG/ACT inhaler  2 times daily        03/30/23 1331              Antony Madura, PA-C 03/30/23 1446    Cathren Laine, MD 03/30/23 1521

## 2023-03-30 NOTE — ED Triage Notes (Signed)
Patient c/o Headache X1wk  and wheezing started last night. Patient is out of his inhalator. H/O asthma

## 2023-04-04 ENCOUNTER — Emergency Department (HOSPITAL_COMMUNITY): Payer: Medicaid Other

## 2023-04-04 ENCOUNTER — Emergency Department (HOSPITAL_COMMUNITY)
Admission: EM | Admit: 2023-04-04 | Discharge: 2023-04-04 | Disposition: A | Discharge status: Home / Self Care | Payer: Medicaid Other | Attending: Emergency Medicine | Admitting: Emergency Medicine

## 2023-04-04 ENCOUNTER — Other Ambulatory Visit: Payer: Self-pay

## 2023-04-04 DIAGNOSIS — Z7951 Long term (current) use of inhaled steroids: Secondary | ICD-10-CM | POA: Insufficient documentation

## 2023-04-04 DIAGNOSIS — J45901 Unspecified asthma with (acute) exacerbation: Secondary | ICD-10-CM | POA: Diagnosis not present

## 2023-04-04 DIAGNOSIS — F17210 Nicotine dependence, cigarettes, uncomplicated: Secondary | ICD-10-CM | POA: Diagnosis not present

## 2023-04-04 DIAGNOSIS — J4531 Mild persistent asthma with (acute) exacerbation: Secondary | ICD-10-CM | POA: Insufficient documentation

## 2023-04-04 DIAGNOSIS — R059 Cough, unspecified: Secondary | ICD-10-CM | POA: Diagnosis not present

## 2023-04-04 DIAGNOSIS — R0602 Shortness of breath: Secondary | ICD-10-CM | POA: Diagnosis not present

## 2023-04-04 MED ORDER — CEFAZOLIN SODIUM-DEXTROSE 2-4 GM/100ML-% IV SOLN
INTRAVENOUS | Status: AC
  Filled 2023-04-04: qty 100

## 2023-04-04 MED ORDER — IPRATROPIUM-ALBUTEROL 0.5-2.5 (3) MG/3ML IN SOLN
9.0000 mL | Freq: Once | RESPIRATORY_TRACT | Status: AC
  Administered 2023-04-04: 9 mL via RESPIRATORY_TRACT
  Filled 2023-04-04: qty 3

## 2023-04-04 MED ORDER — DEXAMETHASONE 4 MG PO TABS
10.0000 mg | ORAL_TABLET | Freq: Once | ORAL | Status: AC
  Administered 2023-04-04: 10 mg via ORAL
  Filled 2023-04-04: qty 3

## 2023-04-04 NOTE — ED Notes (Signed)
Pt states he stopped smoking a week ago. Pt still socialize with people that smokes. Pt states he isn't sure what makes it worse but when he is moving he gets sob and fatigues. Pt states he has prednisone at home but haven't been taking it.

## 2023-04-04 NOTE — ED Notes (Signed)
RN returned to discuss d/c paperwork but pt was no where to be found.

## 2023-04-04 NOTE — ED Triage Notes (Addendum)
Pt. Stated, my inhaler is just a rescuer, my asthma kicked in last night, making it hard to breath and my chest tight. Here 5 days ago for the same symptoms

## 2023-04-04 NOTE — ED Provider Notes (Signed)
EMERGENCY DEPARTMENT AT [facility] Provider Note   HPI: Hector Gross is a 22 year old male with a history of asthma presenting today with concerns of an asthma exacerbation.  He reports last night he started to increase in dry cough and shortness of breath.  He used an inhaler however his symptoms persisted.  Denies any preceding congestion, rhinorrhea, fevers, chills.  He has not had a productive cough.  He reports he was seen in the emergency department 5 days ago for similar symptoms.  He believes his exacerbators is being around secondhand smoke.  He denies smoking himself.  He reports yesterday he was around smoke preceding his symptoms.  He denies any chest pain with the symptoms.  Past Medical History:  Diagnosis Date   Asthma     No past surgical history on file.   Social History   Tobacco Use   Smoking status: Every Day    Packs/day: .5    Types: Cigarettes   Smokeless tobacco: Never  Vaping Use   Vaping Use: Never used  Substance Use Topics   Alcohol use: No   Drug use: Yes    Types: Marijuana      Review of Systems  A complete ROS was performed with pertinent positives/negatives noted in the HPI.   Vitals:   04/04/23 1311 04/04/23 1705  BP: 106/84 124/87  Pulse: (!) 103 91  Resp: 20 18  Temp: 98.2 F (36.8 C) 98.2 F (36.8 C)  SpO2: 97% 100%    Physical Exam Vitals and nursing note reviewed.  Constitutional:      General: He is not in acute distress.    Appearance: He is well-developed. He is not ill-appearing.  Cardiovascular:     Rate and Rhythm: Normal rate and regular rhythm.     Heart sounds: No murmur heard.    No friction rub. No gallop.  Pulmonary:     Effort: Pulmonary effort is normal. No respiratory distress.     Breath sounds: No stridor. Wheezing present. No rhonchi or rales.  Abdominal:     General: Abdomen is flat. There is no distension.  Musculoskeletal:        General: No swelling.  Skin:     General: Skin is warm and dry.     Capillary Refill: Capillary refill takes less than 2 seconds.  Neurological:     Mental Status: He is alert.  Psychiatric:        Mood and Affect: Mood normal.     Procedures  MDM:  Imaging/radiology results:  DG Chest 1 View  Result Date: 04/04/2023 CLINICAL DATA:  Cough, shortness of breath EXAM: CHEST  1 VIEW COMPARISON:  03/25/2023 FINDINGS: Cardiac and mediastinal contours are within normal limits. No focal pulmonary opacity. No pleural effusion or pneumothorax. No acute osseous abnormality. IMPRESSION: No acute cardiopulmonary process. Electronically Signed   By: Wiliam Ke M.D.   On: 04/04/2023 16:04    Key medications administered in the ER:  Medications  ipratropium-albuterol (DUONEB) 0.5-2.5 (3) MG/3ML nebulizer solution 9 mL (9 mLs Nebulization Given 04/04/23 1606)  dexamethasone (DECADRON) tablet 10 mg (10 mg Oral Given 04/04/23 1606)   Medical decision making: -Vital signs stable. Patient afebrile, hemodynamically stable, and non-toxic appearing. -Patient's presentation is most consistent with acute complicated illness / injury requiring diagnostic workup.. HIROMU ADLER is a 22 y.o. male presenting to the emergency department with shortness of breath with symptoms consistent with prior episodes of asthma.  Per  chart review patient was seen 5 days ago in the emergency department was given Decadron and DuoNebs with improvement in symptoms.  Chest x-ray has been obtained to rule out pneumonia or other acute cardiopulmonary abnormality and my interpretation is without pneumonia, pneumothorax, or acute abnormality.  He does not have any chest pain and his wheezing with shortness of breath is more consistent with asthma exacerbation.  Do not suspect ACS, PE, or other emergent cause to his shortness of breath. -Patient given Decadron and DuoNebs.  Upon reassessment, patient with some persistent wheezing but has had improvement.  Patient  ambulated with pulse ox and maintained 99 to 100% without desaturations.  He did not exhibit any shortness of breath or tachypnea with exertion and felt at baseline.  Patient reports having access to his albuterol inhaler. -Patient is stable for discharge.  I have discussed the results, diagnosis, treatment plan, follow up recommendations, and return precautions with the patient. They verbalized understanding.  No further questions at the time of discharge.     Medical Decision Making Amount and/or Complexity of Data Reviewed External Data Reviewed: notes. Radiology: ordered and independent interpretation performed. Decision-making details documented in ED Course.  Risk Prescription drug management.     The plan for this patient was discussed with Dr. Elpidio Anis, who voiced agreement and who oversaw evaluation and treatment of this patient.  Marta Lamas, MD Emergency Medicine, PGY-3  Note: Dragon medical dictation software was used in the creation of this note.   Clinical Impression:  1. Exacerbation of asthma, unspecified asthma severity, unspecified whether persistent          Chase Caller, MD 04/04/23 1800    Mardene Sayer, MD 04/05/23 0145

## 2023-04-04 NOTE — ED Notes (Signed)
Pt ambulated 20 ft from and to bed without sob or fatigue. Pt maintained O2 sat 99-100%

## 2023-04-04 NOTE — Discharge Instructions (Addendum)
Hector Gross:  Thank you for allowing [place] to take care of you today.  We hope you begin feeling better soon.  To-Do: Please follow-up with your primary doctor. Use your inhaler 2 puffs every 4 hours for the next 24 to 48 hours.  If you are needing your inhaler more than every 2 hours for your shortness of breath come back to the emergency room. Please return to the Emergency Department or call 911 if you experience chest pain, shortness of breath, severe pain, severe fever, altered mental status, or have any reason to think that you need emergency medical care.  Thank you again.  Hope you feel better soon.  Department of Emergency Medicine [facility]

## 2023-04-24 ENCOUNTER — Ambulatory Visit: Payer: Medicaid Other | Admitting: Physician Assistant

## 2023-04-24 NOTE — Progress Notes (Deleted)
Patient ID: Hector Gross, male   DOB: 2001/01/18, 22 y.o.   MRN: [id number] HPI: Hector Gross is a 22 year old male with a history of asthma presenting today with concerns of an asthma exacerbation.  He reports last night he started to increase in dry cough and shortness of breath.  He used an inhaler however his symptoms persisted.  Denies any preceding congestion, rhinorrhea, fevers, chills.  He has not had a productive cough.  He reports he was seen in the emergency department 5 days ago for similar symptoms.  He believes his exacerbators is being around secondhand smoke.  He denies smoking himself.  He reports yesterday he was around smoke preceding his symptoms.  He denies any chest pain with the symptoms  Clinical Impression:  1. Exacerbation of asthma, unspecified asthma severity, unspecified whether persistent     Medical decision making: -Vital signs stable. Patient afebrile, hemodynamically stable, and non-toxic appearing. -Patient's presentation is most consistent with acute complicated illness / injury requiring diagnostic workup.. Hector Gross is a 22 y.o. male presenting to the emergency department with shortness of breath with symptoms consistent with prior episodes of asthma.  Per chart review patient was seen 5 days ago in the emergency department was given Decadron and DuoNebs with improvement in symptoms.  Chest x-ray has been obtained to rule out pneumonia or other acute cardiopulmonary abnormality and my interpretation is without pneumonia, pneumothorax, or acute abnormality.  He does not have any chest pain and his wheezing with shortness of breath is more consistent with asthma exacerbation.  Do not suspect ACS, PE, or other emergent cause to his shortness of breath. -Patient given Decadron and DuoNebs.  Upon reassessment, patient with some persistent wheezing but has had improvement.  Patient ambulated with pulse ox and maintained 99 to 100% without desaturations.  He  did not exhibit any shortness of breath or tachypnea with exertion and felt at baseline.  Patient reports having access to his albuterol inhaler. -Patient is stable for discharge.  I have discussed the results, diagnosis, treatment plan, follow up recommendations, and return precautions with the patient. They verbalized understanding.  No further questions at the time of discharge.

## 2023-05-07 ENCOUNTER — Encounter (HOSPITAL_COMMUNITY): Payer: Self-pay

## 2023-05-07 ENCOUNTER — Emergency Department (HOSPITAL_COMMUNITY): Payer: Medicaid Other

## 2023-05-07 ENCOUNTER — Other Ambulatory Visit: Payer: Self-pay

## 2023-05-07 ENCOUNTER — Emergency Department (HOSPITAL_COMMUNITY)
Admission: EM | Admit: 2023-05-07 | Discharge: 2023-05-07 | Discharge status: Against Medical Advice | Payer: Medicaid Other | Attending: Emergency Medicine | Admitting: Emergency Medicine

## 2023-05-07 ENCOUNTER — Emergency Department (HOSPITAL_COMMUNITY)
Admission: EM | Admit: 2023-05-07 | Discharge: 2023-05-08 | Disposition: A | Discharge status: Home / Self Care | Payer: Medicaid Other | Source: Home / Self Care | Attending: Student | Admitting: Student

## 2023-05-07 DIAGNOSIS — J4521 Mild intermittent asthma with (acute) exacerbation: Secondary | ICD-10-CM | POA: Insufficient documentation

## 2023-05-07 DIAGNOSIS — R0789 Other chest pain: Secondary | ICD-10-CM | POA: Insufficient documentation

## 2023-05-07 DIAGNOSIS — Z5321 Procedure and treatment not carried out due to patient leaving prior to being seen by health care provider: Secondary | ICD-10-CM | POA: Diagnosis not present

## 2023-05-07 DIAGNOSIS — R109 Unspecified abdominal pain: Secondary | ICD-10-CM | POA: Insufficient documentation

## 2023-05-07 DIAGNOSIS — Z7951 Long term (current) use of inhaled steroids: Secondary | ICD-10-CM | POA: Insufficient documentation

## 2023-05-07 DIAGNOSIS — R519 Headache, unspecified: Secondary | ICD-10-CM | POA: Diagnosis not present

## 2023-05-07 DIAGNOSIS — Z1152 Encounter for screening for COVID-19: Secondary | ICD-10-CM | POA: Insufficient documentation

## 2023-05-07 LAB — COMPREHENSIVE METABOLIC PANEL
ALT: 42 U/L (ref 0–44)
AST: 36 U/L (ref 15–41)
Albumin: 4.1 g/dL (ref 3.5–5.0)
Alkaline Phosphatase: 56 U/L (ref 38–126)
Anion gap: 13 (ref 5–15)
BUN: 12 mg/dL (ref 6–20)
CO2: 23 mmol/L (ref 22–32)
Calcium: 9.6 mg/dL (ref 8.9–10.3)
Chloride: 104 mmol/L (ref 98–111)
Creatinine, Ser: 1.2 mg/dL (ref 0.61–1.24)
GFR, Estimated: >60 mL/min (ref >60)
Glucose, Bld: 85 mg/dL (ref 70–99)
Potassium: 3.6 mmol/L (ref 3.5–5.1)
Sodium: 140 mmol/L (ref 135–145)
Total Bilirubin: 0.7 mg/dL (ref 0.3–1.2)
Total Protein: 7.3 g/dL (ref 6.5–8.1)

## 2023-05-07 LAB — CBC
HCT: 43 % (ref 39.0–52.0)
Hemoglobin: 14.7 g/dL (ref 13.0–17.0)
MCH: 28.8 pg (ref 26.0–34.0)
MCHC: 34.2 g/dL (ref 30.0–36.0)
MCV: 84.1 fL (ref 80.0–100.0)
Platelets: 378 10*3/uL (ref 150–400)
RBC: 5.11 MIL/uL (ref 4.22–5.81)
RDW: 13.2 % (ref 11.5–15.5)
WBC: 7.3 10*3/uL (ref 4.0–10.5)
nRBC: 0 % (ref 0.0–0.2)

## 2023-05-07 LAB — RESP PANEL BY RT-PCR (RSV, FLU A&B, COVID)  RVPGX2
Influenza A by PCR: NEGATIVE
Influenza B by PCR: NEGATIVE
Resp Syncytial Virus by PCR: NEGATIVE
SARS Coronavirus 2 by RT PCR: NEGATIVE

## 2023-05-07 MED ORDER — ALBUTEROL SULFATE (2.5 MG/3ML) 0.083% IN NEBU
5.0000 mg | INHALATION_SOLUTION | Freq: Once | RESPIRATORY_TRACT | Status: AC
  Administered 2023-05-07: 5 mg via RESPIRATORY_TRACT
  Filled 2023-05-07: qty 6

## 2023-05-07 MED ORDER — IPRATROPIUM-ALBUTEROL 0.5-2.5 (3) MG/3ML IN SOLN
3.0000 mL | Freq: Once | RESPIRATORY_TRACT | Status: AC
  Administered 2023-05-08: 3 mL via RESPIRATORY_TRACT
  Filled 2023-05-07: qty 3

## 2023-05-07 MED ORDER — DEXAMETHASONE SODIUM PHOSPHATE 10 MG/ML IJ SOLN
10.0000 mg | Freq: Once | INTRAMUSCULAR | Status: AC
  Administered 2023-05-08: 10 mg via INTRAMUSCULAR
  Filled 2023-05-07: qty 1

## 2023-05-07 NOTE — ED Triage Notes (Signed)
Pt c/o chest tightness and left flank pain, Hax1wk.

## 2023-05-07 NOTE — ED Provider Notes (Signed)
[place] EMERGENCY DEPARTMENT AT [facility] Provider Note   CSN: [id number] Arrival date & time: 05/07/23  2154     History  Chief Complaint  Patient presents with   Asthma    Hector Gross is a 22 y.o. male.  With past medical history of asthma, chronic marijuana use who presents to the emergency department with asthma exacerbation.  States***  HPI     Home Medications Prior to Admission medications   Medication Sig Start Date End Date Taking? Authorizing Provider  albuterol (VENTOLIN HFA) 108 (90 Base) MCG/ACT inhaler Inhale 1-2 puffs into the lungs every 6 (six) hours as needed for wheezing or shortness of breath. 03/30/23   Antony Madura, PA-C  fluticasone-salmeterol (ADVAIR HFA) 409-81 MCG/ACT inhaler Inhale 2 puffs into the lungs 2 (two) times daily. 03/30/23   Antony Madura, PA-C  montelukast (SINGULAIR) 10 MG tablet Take 1 tablet (10 mg total) by mouth at bedtime. 03/30/23   Antony Madura, PA-C  predniSONE (STERAPRED UNI-PAK 21 TAB) 10 MG (21) TBPK tablet Use per package instruction 01/31/23   [name], [name], MD  triamcinolone cream (KENALOG) 0.1 % Apply 1 Application topically 2 (two) times daily. 02/20/23   [name], NP      Allergies    Patient has no known allergies.    Review of Systems   Review of Systems  Physical Exam Updated Vital Signs BP (!) 139/91   Pulse 97   Temp 98.6 F (37 C) (Oral)   Resp 18   SpO2 95%  Physical Exam  ED Results / Procedures / Treatments   Labs (all labs ordered are listed, but only abnormal results are displayed) Labs Reviewed - No data to display  EKG None  Radiology DG Chest 1 View  Result Date: 05/07/2023 CLINICAL DATA:  141880 SOB (shortness of breath) 141880 EXAM: CHEST  1 VIEW COMPARISON:  04/04/2023. FINDINGS: Bilateral lung fields are clear. Bilateral costophrenic angles are clear. Normal cardio-mediastinal silhouette. No acute osseous abnormalities. The soft tissues are within normal  limits. IMPRESSION: No active disease. Electronically Signed   By: Jules Schick M.D.   On: 05/07/2023 15:25    Procedures Procedures   Medications Ordered in ED Medications  albuterol (PROVENTIL) (2.5 MG/3ML) 0.083% nebulizer solution 5 mg (5 mg Nebulization Given 05/07/23 2205)    ED Course/ Medical Decision Making/ A&P   {   Click here for ABCD2, HEART and other calculatorsREFRESH Note before signing :1}                          Medical Decision Making Risk Prescription drug management.  Initial Impression and Ddx 22 year old male who presents to the emergency department with asthma exacerbation Patient PMH that increases complexity of ED encounter: Asthma, chronic marijuana use  Interpretation of Diagnostics I independent reviewed and interpreted the labs as followed: ***  - I independently visualized the following imaging with scope of interpretation limited to determining acute life threatening conditions related to emergency care: Chest x-ray, which revealed no acute findings  Patient Reassessment and Ultimate Disposition/Management ***  Patient management required discussion with the following services or consulting groups:  {BEROCONSULT:26841}  Complexity of Problems Addressed {BEROCOPA:26833}  Additional Data Reviewed and Analyzed Further history obtained from: {BERODATA:26834}  Patient Encounter Risk Assessment {BERORISK:26838}  Final Clinical Impression(s) / ED Diagnoses Final diagnoses:  None    Rx / DC Orders ED Discharge Orders     None

## 2023-05-07 NOTE — ED Notes (Signed)
Pt called for a room x3 times with no answer.

## 2023-05-07 NOTE — ED Triage Notes (Signed)
Patient reports asthma attack last night with wheezing /nasal congestion unrelieved by rescue inhaler . No fever or cough .

## 2023-05-08 ENCOUNTER — Telehealth: Payer: Self-pay

## 2023-05-08 MED ORDER — ALBUTEROL SULFATE HFA 108 (90 BASE) MCG/ACT IN AERS: 1.0000 | INHALATION_SPRAY | Freq: Four times a day (QID) | RESPIRATORY_TRACT | 0 refills | Status: DC | PRN

## 2023-05-08 MED ORDER — ALBUTEROL SULFATE HFA 108 (90 BASE) MCG/ACT IN AERS
1.0000 | INHALATION_SPRAY | RESPIRATORY_TRACT | Status: DC | PRN
  Administered 2023-05-08: 2 via RESPIRATORY_TRACT
  Filled 2023-05-08: qty 6.7

## 2023-05-08 NOTE — Telephone Encounter (Signed)
Patient was seen in the ED on 05/07/2023 and calling for follow-up. Return call placed to patient to schedule appointment was unable to reach . Message left on VM

## 2023-05-08 NOTE — Discharge Instructions (Addendum)
You were seen in the emergency department today for asthma.  We were able to give you some breathing treatments which improved your symptoms.  Your chest x-ray does not show any pneumonia.  Please use Mucinex over-the-counter to help with congestion.  Please return to the emergency department for severely worsening symptoms.

## 2023-05-08 NOTE — ED Notes (Signed)
Discharge instructions discussed with pt. Verbalized understanding. VSS. No questions or concerns regarding discharge  

## 2023-05-09 ENCOUNTER — Telehealth: Payer: Self-pay | Admitting: *Deleted

## 2023-05-09 NOTE — Transitions of Care (Post Inpatient/ED Visit) (Signed)
   05/09/2023  Name: Hector Gross MRN: [id number] DOB: [birth year]  Today's TOC FU Call Status: Today's TOC FU Call Status:: Unsuccessul Call (1st Attempt) Unsuccessful Call (1st Attempt) Date: 05/09/23  Attempted to reach the patient regarding the most recent Inpatient/ED visit.  Follow Up Plan: Additional outreach attempts will be made to reach the patient to complete the Transitions of Care (Post Inpatient/ED visit) call.   Estanislado Emms RN, BSN [place]  Managed [facility] RN Care Coordinator [phone]

## 2023-05-13 ENCOUNTER — Emergency Department (HOSPITAL_COMMUNITY)
Admission: EM | Admit: 2023-05-13 | Discharge: 2023-05-14 | Disposition: A | Discharge status: Home / Self Care | Payer: Medicaid Other | Source: Home / Self Care | Attending: Emergency Medicine | Admitting: Emergency Medicine

## 2023-05-13 ENCOUNTER — Other Ambulatory Visit: Payer: Self-pay

## 2023-05-13 ENCOUNTER — Encounter (HOSPITAL_COMMUNITY): Payer: Self-pay | Admitting: Emergency Medicine

## 2023-05-13 ENCOUNTER — Emergency Department (HOSPITAL_COMMUNITY)
Admission: EM | Admit: 2023-05-13 | Discharge: 2023-05-13 | Discharge status: Against Medical Advice | Payer: Medicaid Other | Attending: Emergency Medicine | Admitting: Emergency Medicine

## 2023-05-13 ENCOUNTER — Encounter (HOSPITAL_COMMUNITY): Payer: Self-pay

## 2023-05-13 ENCOUNTER — Emergency Department (HOSPITAL_COMMUNITY): Payer: Medicaid Other

## 2023-05-13 DIAGNOSIS — J4541 Moderate persistent asthma with (acute) exacerbation: Secondary | ICD-10-CM | POA: Insufficient documentation

## 2023-05-13 DIAGNOSIS — R000 Tachycardia, unspecified: Secondary | ICD-10-CM | POA: Insufficient documentation

## 2023-05-13 DIAGNOSIS — R0789 Other chest pain: Secondary | ICD-10-CM | POA: Diagnosis not present

## 2023-05-13 DIAGNOSIS — J0100 Acute maxillary sinusitis, unspecified: Secondary | ICD-10-CM | POA: Insufficient documentation

## 2023-05-13 DIAGNOSIS — R0602 Shortness of breath: Secondary | ICD-10-CM | POA: Diagnosis not present

## 2023-05-13 DIAGNOSIS — Z5321 Procedure and treatment not carried out due to patient leaving prior to being seen by health care provider: Secondary | ICD-10-CM | POA: Insufficient documentation

## 2023-05-13 DIAGNOSIS — R062 Wheezing: Secondary | ICD-10-CM | POA: Diagnosis not present

## 2023-05-13 DIAGNOSIS — Z1152 Encounter for screening for COVID-19: Secondary | ICD-10-CM | POA: Diagnosis not present

## 2023-05-13 DIAGNOSIS — R069 Unspecified abnormalities of breathing: Secondary | ICD-10-CM | POA: Diagnosis not present

## 2023-05-13 DIAGNOSIS — R739 Hyperglycemia, unspecified: Secondary | ICD-10-CM | POA: Insufficient documentation

## 2023-05-13 DIAGNOSIS — J45901 Unspecified asthma with (acute) exacerbation: Secondary | ICD-10-CM | POA: Insufficient documentation

## 2023-05-13 LAB — CBC WITH DIFFERENTIAL/PLATELET
Abs Immature Granulocytes: 0.01 10*3/uL (ref 0.00–0.07)
Basophils Absolute: 0.1 10*3/uL (ref 0.0–0.1)
Basophils Relative: 1 %
Eosinophils Absolute: 0.9 10*3/uL — ABNORMAL HIGH (ref 0.0–0.5)
Eosinophils Relative: 15 %
HCT: 44.8 % (ref 39.0–52.0)
Hemoglobin: 15.2 g/dL (ref 13.0–17.0)
Immature Granulocytes: 0 %
Lymphocytes Relative: 25 %
Lymphs Abs: 1.4 10*3/uL (ref 0.7–4.0)
MCH: 27.7 pg (ref 26.0–34.0)
MCHC: 33.9 g/dL (ref 30.0–36.0)
MCV: 81.8 fL (ref 80.0–100.0)
Monocytes Absolute: 0.5 10*3/uL (ref 0.1–1.0)
Monocytes Relative: 8 %
Neutro Abs: 2.9 10*3/uL (ref 1.7–7.7)
Neutrophils Relative %: 51 %
Platelets: 413 10*3/uL — ABNORMAL HIGH (ref 150–400)
RBC: 5.48 MIL/uL (ref 4.22–5.81)
RDW: 13.2 % (ref 11.5–15.5)
WBC: 5.7 10*3/uL (ref 4.0–10.5)
nRBC: 0 % (ref 0.0–0.2)

## 2023-05-13 LAB — BASIC METABOLIC PANEL
Anion gap: 8 (ref 5–15)
BUN: 11 mg/dL (ref 6–20)
CO2: 25 mmol/L (ref 22–32)
Calcium: 9.6 mg/dL (ref 8.9–10.3)
Chloride: 104 mmol/L (ref 98–111)
Creatinine, Ser: 1.07 mg/dL (ref 0.61–1.24)
GFR, Estimated: >60 mL/min (ref >60)
Glucose, Bld: 89 mg/dL (ref 70–99)
Potassium: 3.8 mmol/L (ref 3.5–5.1)
Sodium: 137 mmol/L (ref 135–145)

## 2023-05-13 LAB — SARS CORONAVIRUS 2 BY RT PCR: SARS Coronavirus 2 by RT PCR: NEGATIVE

## 2023-05-13 MED ORDER — ALBUTEROL SULFATE (2.5 MG/3ML) 0.083% IN NEBU
5.0000 mg | INHALATION_SOLUTION | Freq: Once | RESPIRATORY_TRACT | Status: AC
  Administered 2023-05-14: 5 mg via RESPIRATORY_TRACT
  Filled 2023-05-13: qty 6

## 2023-05-13 MED ORDER — IPRATROPIUM BROMIDE 0.02 % IN SOLN
0.5000 mg | Freq: Once | RESPIRATORY_TRACT | Status: AC
  Administered 2023-05-14: 0.5 mg via RESPIRATORY_TRACT
  Filled 2023-05-13: qty 2.5

## 2023-05-13 MED ORDER — IPRATROPIUM-ALBUTEROL 0.5-2.5 (3) MG/3ML IN SOLN
3.0000 mL | Freq: Once | RESPIRATORY_TRACT | Status: AC
  Administered 2023-05-13: 3 mL via RESPIRATORY_TRACT
  Filled 2023-05-13: qty 3

## 2023-05-13 MED ORDER — MAGNESIUM SULFATE 2 GM/50ML IV SOLN
2.0000 g | INTRAVENOUS | Status: DC
  Administered 2023-05-14: 2 g via INTRAVENOUS
  Filled 2023-05-13: qty 50

## 2023-05-13 NOTE — ED Triage Notes (Signed)
Reports asthma flare up that started this am.  Denies cough.  Reports rescue inhaler is not working.

## 2023-05-13 NOTE — ED Notes (Signed)
Pt called in lobby multiple times with no response.

## 2023-05-13 NOTE — ED Provider Triage Note (Signed)
Emergency Medicine Provider Triage Evaluation Note  Hector Gross , a 22 y.o. male  was evaluated in triage.  Pt complains of asthma exacerbation.  Patient dates that he started wheezing this morning and that his albuterol inhaler at home has not been helping.  Patient denies any fevers but notes that he has right-sided sinus fullness but denies any headache, vision changes, dizziness, weakness, neck pain.  Patient denies any fevers and thinks that the weather is causing his asthma exacerbations.  Patient denies any chest pain.  Review of Systems  Positive: See HPI Negative: See HPI  Physical Exam  BP 133/80   Pulse 96   Temp 98.4 F (36.9 C)   Resp 16   SpO2 99%  Gen:   Awake, no distress   Resp:  Left-sided expiratory wheezing noted MSK:   Moves extremities without difficulty  Other:  Right-sided maxillary sinus tenderness  Medical Decision Making  Medically screening exam initiated at 2:36 PM.  Appropriate orders placed.  Hector Gross was informed that the remainder of the evaluation will be completed by another provider, this initial triage assessment does not replace that evaluation, and the importance of remaining in the ED until their evaluation is complete.  Workup initiated, patient stable this time   Remi Deter 05/13/23 1437

## 2023-05-13 NOTE — ED Provider Triage Note (Signed)
Emergency Medicine Provider Triage Evaluation Note  Hector Gross , a 22 y.o. male  was evaluated in triage.  Pt complains of ongoing SOB today. Came to ED earlier, but left after triage. Returning via EMS. Has been using his rescue inhaler w/o relief. Off maintenance asthma medications for a while because Medicaid wouldn't cover them. Denies chest pain, fevers. Given 10mg  albuterol, atrovent, and Solumedrol during transport.  Review of Systems  Positive: As above Negative: As above  Physical Exam  BP (!) 123/106 (BP Location: Right Arm)   Pulse (!) 106   Temp 98.6 F (37 C) (Oral)   Resp (!) 24   Ht 6' (1.829 m)   Wt 93 kg   SpO2 100%   BMI 27.80 kg/m  Gen:   Awake, no distress   Resp:  Mild tachypnea. Diffuse expiratory wheezing. MSK:   Moves extremities without difficulty  Other:  Receiving neb treatment.  Medical Decision Making  Medically screening exam initiated at 11:53 PM.  Appropriate orders placed.  Hector Gross was informed that the remainder of the evaluation will be completed by another provider, this initial triage assessment does not replace that evaluation, and the importance of remaining in the ED until their evaluation is complete.  Asthma exacerbation - meds ordered.   Antony Madura, PA-C 05/13/23 2355

## 2023-05-13 NOTE — ED Triage Notes (Addendum)
Pt BIB [facility] for SOB worsening today, pt wheezing upon arrival, administered Solumedrol 125mg , atrovent 1.0mg , albuterol 10mg , hx of asthma, no relief with home neb   v/s en route 141/84, HR 106, 100% on neb treatment, RR27

## 2023-05-14 ENCOUNTER — Emergency Department (HOSPITAL_COMMUNITY): Payer: Medicaid Other

## 2023-05-14 DIAGNOSIS — R0602 Shortness of breath: Secondary | ICD-10-CM | POA: Diagnosis not present

## 2023-05-14 LAB — CBC
HCT: 43.3 % (ref 39.0–52.0)
Hemoglobin: 14.6 g/dL (ref 13.0–17.0)
MCH: 27.7 pg (ref 26.0–34.0)
MCHC: 33.7 g/dL (ref 30.0–36.0)
MCV: 82 fL (ref 80.0–100.0)
Platelets: 379 10*3/uL (ref 150–400)
RBC: 5.28 MIL/uL (ref 4.22–5.81)
RDW: 13.4 % (ref 11.5–15.5)
WBC: 9.6 10*3/uL (ref 4.0–10.5)
nRBC: 0 % (ref 0.0–0.2)

## 2023-05-14 LAB — BASIC METABOLIC PANEL
Anion gap: 9 (ref 5–15)
BUN: 14 mg/dL (ref 6–20)
CO2: 23 mmol/L (ref 22–32)
Calcium: 9.1 mg/dL (ref 8.9–10.3)
Chloride: 104 mmol/L (ref 98–111)
Creatinine, Ser: 1.24 mg/dL (ref 0.61–1.24)
GFR, Estimated: >60 mL/min (ref >60)
Glucose, Bld: 114 mg/dL — ABNORMAL HIGH (ref 70–99)
Potassium: 3.5 mmol/L (ref 3.5–5.1)
Sodium: 136 mmol/L (ref 135–145)

## 2023-05-14 MED ORDER — PREDNISONE 20 MG PO TABS: 40.0000 mg | ORAL_TABLET | Freq: Every day | ORAL | 0 refills | Status: DC

## 2023-05-14 MED ORDER — IPRATROPIUM-ALBUTEROL 0.5-2.5 (3) MG/3ML IN SOLN: 3.0000 mL | Freq: Once | RESPIRATORY_TRACT | Status: DC

## 2023-05-14 MED ORDER — MAGNESIUM SULFATE 2 GM/50ML IV SOLN: 2.0000 g | Freq: Once | INTRAVENOUS | Status: DC

## 2023-05-14 NOTE — ED Provider Notes (Signed)
[place] EMERGENCY DEPARTMENT AT [facility] Provider Note   CSN: [id number] Arrival date & time: 05/13/23  2334     History  Chief Complaint  Patient presents with   Shortness of Breath    EUFEMIO STFLEUR is a 22 y.o. male past medical history significant for asthma, marijuana use who presents concern for shortness of breath worsening today over the last 2 days.  Patient with wheezing on arrival, he had 125 mg Solu-Medrol, 1 mg of Atrovent and 10 mg of albuterol just prior to arrival.  He had no relief with nebulizer and rescue inhaler.  He reports that he has had some smoke exposure from others.  He is 100% oxygen saturation on room air on my exam.  And reports that he is feeling somewhat improved after rescue medications.  Still having some wheezing however.  He denies any personal smoking, denies any fever, chills, cough, shortness of breath, chest pain, nausea, vomiting   Shortness of Breath      Home Medications Prior to Admission medications   Medication Sig Start Date End Date Taking? Authorizing Provider  predniSONE (DELTASONE) 20 MG tablet Take 2 tablets (40 mg total) by mouth daily. 05/14/23  Yes Natalye Kott H, PA-C  albuterol (VENTOLIN HFA) 108 (90 Base) MCG/ACT inhaler Inhale 1-2 puffs into the lungs every 6 (six) hours as needed for wheezing or shortness of breath. 05/08/23   Cristopher Peru, PA-C  fluticasone-salmeterol (ADVAIR HFA) 409-81 MCG/ACT inhaler Inhale 2 puffs into the lungs 2 (two) times daily. 03/30/23   Antony Madura, PA-C  montelukast (SINGULAIR) 10 MG tablet Take 1 tablet (10 mg total) by mouth at bedtime. 03/30/23   Antony Madura, PA-C  triamcinolone cream (KENALOG) 0.1 % Apply 1 Application topically 2 (two) times daily. 02/20/23   [name], NP      Allergies    Patient has no known allergies.    Review of Systems   Review of Systems  Respiratory:  Positive for shortness of breath.   All other systems reviewed and are  negative.   Physical Exam Updated Vital Signs BP (!) 123/106 (BP Location: Right Arm)   Pulse (!) 106   Temp 98.6 F (37 C) (Oral)   Resp (!) 24   Ht 6' (1.829 m)   Wt 93 kg   SpO2 100%   BMI 27.80 kg/m  Physical Exam Vitals and nursing note reviewed.  Constitutional:      General: He is not in acute distress.    Appearance: Normal appearance.  HENT:     Head: Normocephalic and atraumatic.  Eyes:     General:        Right eye: No discharge.        Left eye: No discharge.  Cardiovascular:     Rate and Rhythm: Regular rhythm. Tachycardia present.     Heart sounds: No murmur heard.    No friction rub. No gallop.  Pulmonary:     Effort: Pulmonary effort is normal.     Breath sounds: Normal breath sounds.     Comments: Patient with inspiratory and expiratory wheezing, and increased work of breathing on arrival.  No rhonchi, stridor, and controlled respiratory effort with some tachypnea on arrival.  On reevaluation tachypnea, wheezing significantly improved. Abdominal:     General: Bowel sounds are normal.     Palpations: Abdomen is soft.  Skin:    General: Skin is warm and dry.     Capillary Refill: Capillary  refill takes less than 2 seconds.  Neurological:     Mental Status: He is alert and oriented to person, place, and time.  Psychiatric:        Mood and Affect: Mood normal.        Behavior: Behavior normal.     ED Results / Procedures / Treatments   Labs (all labs ordered are listed, but only abnormal results are displayed) Labs Reviewed  BASIC METABOLIC PANEL - Abnormal; Notable for the following components:      Result Value   Glucose, Bld 114 (*)    All other components within normal limits  CBC    EKG None  Radiology DG Chest 2 View  Result Date: 05/14/2023 CLINICAL DATA:  Shortness of breath. EXAM: CHEST - 2 VIEW COMPARISON:  05/13/2023. FINDINGS: The heart size and mediastinal contours are within normal limits. Hyperinflation of the lungs is  noted. No consolidation, effusion, or pneumothorax. No acute osseous abnormality. IMPRESSION: No active cardiopulmonary disease. Electronically Signed   By: [name] M.D.   On: 05/14/2023 01:13   DG Chest 2 View  Result Date: 05/13/2023 CLINICAL DATA:  Asthma EXAM: CHEST - 2 VIEW COMPARISON:  05/07/2023. FINDINGS: Bilateral lung fields are clear. Bilateral costophrenic angles are clear. Normal cardio-mediastinal silhouette. No acute osseous abnormalities. The soft tissues are within normal limits. IMPRESSION: No active cardiopulmonary disease. Electronically Signed   By: Jules Schick M.D.   On: 05/13/2023 15:46    Procedures Procedures    Medications Ordered in ED Medications  magnesium sulfate IVPB 2 g 50 mL (2 g Intravenous Not Given 05/14/23 0042)  albuterol (PROVENTIL) (2.5 MG/3ML) 0.083% nebulizer solution 5 mg (5 mg Nebulization Given 05/14/23 0014)  ipratropium (ATROVENT) nebulizer solution 0.5 mg (0.5 mg Nebulization Given 05/14/23 0015)    ED Course/ Medical Decision Making/ A&P                             Medical Decision Making Amount and/or Complexity of Data Reviewed Labs: ordered. Radiology: ordered.  Risk Prescription drug management.   This patient is a 22 y.o. male  who presents to the ED for concern of shortness of breath.   Differential diagnoses prior to evaluation: The emergent differential diagnosis includes, but is not limited to,  asthma exacerbation, COPD exacerbation, acute upper respiratory infection, acute bronchitis, chronic bronchitis, interstitial lung disease, ARDS, PE, pneumonia, atypical ACS, carbon monoxide poisoning, spontaneous pneumothorax, new CHF vs CHF exacerbation, versus other . This is not an exhaustive differential.   Past Medical History / Co-morbidities / Social History: Asthma, marijuana use  Additional history: Chart reviewed. Pertinent results include: Reviewed lab work, imaging from recent previous emergency department  visits  Physical Exam: Physical exam performed. The pertinent findings include: Patient with diffuse biphasic wheezing on arrival, tachycardia, tachypnea.  On reevaluation with improvement of tachycardia and tachypnea and no remaining wheezing after DuoNeb, Solu-Medrol, magnesium.  Lab Tests/Imaging studies: I personally interpreted labs/imaging and the pertinent results include: CBC unremarkable, BMP overall unremarkable.  Mild hyperglycemia, glucose 114 on nonfasting lab value.  I independently interpreted plain film chest x-ray which shows no evidence of acute intrathoracic abnormality. I agree with the radiologist interpretation.  Medications: I ordered medication including DuoNeb, magnesium for asthma exacerbation.  I have reviewed the patients home medicines and have made adjustments as needed.   Disposition: After consideration of the diagnostic results and the patients response to treatment, I feel that  patient symptoms consistent with moderate persistent asthma exacerbation.  Will discharge with steroid burst, encourage close PCP follow-up.   emergency department workup does not suggest an emergent condition requiring admission or immediate intervention beyond what has been performed at this time. The plan is: as above. The patient is safe for discharge and has been instructed to return immediately for worsening symptoms, change in symptoms or any other concerns.  Final Clinical Impression(s) / ED Diagnoses Final diagnoses:  Moderate persistent asthma with exacerbation    Rx / DC Orders ED Discharge Orders          Ordered    predniSONE (DELTASONE) 20 MG tablet  Daily        05/14/23 0215              [place] 05/14/23 6237    Tilden Fossa, MD 05/14/23 [phone]

## 2023-05-14 NOTE — Discharge Instructions (Addendum)
Make sure you are using all of your home inhalers, try to avoid smoke exposure began please take the entire course of steroids that we have prescribed to you.

## 2023-05-16 ENCOUNTER — Telehealth: Payer: Self-pay

## 2023-05-16 NOTE — Transitions of Care (Post Inpatient/ED Visit) (Signed)
   05/16/2023  Name: Hector Gross MRN: [id number] DOB: 2001-08-06  Today's TOC FU Call Status: Today's TOC FU Call Status:: Unsuccessful Call (1st Attempt) Unsuccessful Call (1st Attempt) Date: 05/16/23  Attempted to reach the patient regarding the most recent Inpatient/ED visit.  Follow Up Plan: Additional outreach attempts will be made to reach the patient to complete the Transitions of Care (Post Inpatient/ED visit) call.   Abelino Derrick, MHA [facility]  Managed [facility] Social Worker [phone]

## 2023-05-19 ENCOUNTER — Other Ambulatory Visit: Payer: Self-pay

## 2023-05-19 ENCOUNTER — Encounter (HOSPITAL_COMMUNITY): Payer: Self-pay

## 2023-05-19 ENCOUNTER — Emergency Department (HOSPITAL_COMMUNITY)
Admission: EM | Admit: 2023-05-19 | Discharge: 2023-05-19 | Disposition: A | Discharge status: Home / Self Care | Payer: Medicaid Other | Source: Home / Self Care | Attending: Emergency Medicine | Admitting: Emergency Medicine

## 2023-05-19 DIAGNOSIS — Z7951 Long term (current) use of inhaled steroids: Secondary | ICD-10-CM | POA: Diagnosis not present

## 2023-05-19 DIAGNOSIS — J4541 Moderate persistent asthma with (acute) exacerbation: Secondary | ICD-10-CM

## 2023-05-19 DIAGNOSIS — R0602 Shortness of breath: Secondary | ICD-10-CM | POA: Diagnosis not present

## 2023-05-19 MED ORDER — DEXAMETHASONE 4 MG PO TABS
8.0000 mg | ORAL_TABLET | Freq: Once | ORAL | Status: AC
  Administered 2023-05-19: 8 mg via ORAL
  Filled 2023-05-19: qty 2

## 2023-05-19 MED ORDER — IPRATROPIUM-ALBUTEROL 0.5-2.5 (3) MG/3ML IN SOLN
3.0000 mL | Freq: Once | RESPIRATORY_TRACT | Status: AC
  Administered 2023-05-19: 3 mL via RESPIRATORY_TRACT
  Filled 2023-05-19: qty 3

## 2023-05-19 MED ORDER — FLUTICASONE-SALMETEROL 115-21 MCG/ACT IN AERO: 2.0000 | INHALATION_SPRAY | Freq: Two times a day (BID) | RESPIRATORY_TRACT | 1 refills | Status: DC

## 2023-05-19 MED ORDER — MONTELUKAST SODIUM 10 MG PO TABS: 10.0000 mg | ORAL_TABLET | Freq: Every day | ORAL | 0 refills | Status: DC

## 2023-05-19 NOTE — ED Triage Notes (Addendum)
Pt arrives with c/o SOB that started this morning. Pt endorsed CP and wheezing. Pt has hx of asthma. Pt denies sick symptoms. Pt used his rescue inhaler without relief.

## 2023-05-19 NOTE — ED Provider Notes (Signed)
[place] EMERGENCY DEPARTMENT AT [facility] Provider Note   CSN: [id number] Arrival date & time: 05/19/23  9147     History  Chief Complaint  Patient presents with   Shortness of Breath    Hector Gross is a 22 y.o. male.  The history is provided by the patient and a significant other.  Patient with history of asthma presents with wheezing and shortness of breath.  He reports over the past day has had increasing chest tightness, wheezing and cough.  No fevers or vomiting.  No hemoptysis is reported.  He is no longer taking any daily medications for his asthma He has not seen his PCP recently.  He has been admitted before, but denies history of intubation     Home Medications Prior to Admission medications   Medication Sig Start Date End Date Taking? Authorizing Provider  fluticasone-salmeterol (ADVAIR HFA) 115-21 MCG/ACT inhaler Inhale 2 puffs into the lungs 2 (two) times daily. 05/19/23  Yes Zadie Rhine, MD  montelukast (SINGULAIR) 10 MG tablet Take 1 tablet (10 mg total) by mouth at bedtime. 05/19/23  Yes Zadie Rhine, MD  albuterol (VENTOLIN HFA) 108 (90 Base) MCG/ACT inhaler Inhale 1-2 puffs into the lungs every 6 (six) hours as needed for wheezing or shortness of breath. 05/08/23   Cristopher Peru, PA-C  triamcinolone cream (KENALOG) 0.1 % Apply 1 Application topically 2 (two) times daily. 02/20/23   [name], NP      Allergies    Patient has no known allergies.    Review of Systems   Review of Systems  Constitutional:  Negative for fever.  Respiratory:  Positive for cough, shortness of breath and wheezing.     Physical Exam Updated Vital Signs BP (!) 135/97 (BP Location: Right Arm)   Pulse 96   Temp 97.6 F (36.4 C) (Oral)   Resp 19   Wt 93 kg   SpO2 96%   BMI 27.80 kg/m  Physical Exam CONSTITUTIONAL: Well developed/well nourished HEAD: Normocephalic/atraumatic ENMT: Mucous membranes moist NECK: supple no meningeal signs CV:  S1/S2 noted, no murmurs/rubs/gallops noted LUNGS: Wheezing bilaterally, no acute distress, no crackles ABDOMEN: soft NEURO: Pt is awake/alert/appropriate, moves all extremitiesx4.  No facial droop.   SKIN: warm, color normal   ED Results / Procedures / Treatments   Labs (all labs ordered are listed, but only abnormal results are displayed) Labs Reviewed - No data to display  EKG EKG Interpretation Date/Time:  Sunday May 19 2023 00:28:47 EDT Ventricular Rate:  105 PR Interval:  136 QRS Duration:  74 QT Interval:  314 QTC Calculation: 415 R Axis:   74  Text Interpretation: Sinus tachycardia Nonspecific T wave abnormality Abnormal ECG Confirmed by Zadie Rhine ([id number]) on 05/19/2023 12:58:30 AM  Radiology No results found.  Procedures Procedures    Medications Ordered in ED Medications  ipratropium-albuterol (DUONEB) 0.5-2.5 (3) MG/3ML nebulizer solution 3 mL (3 mLs Nebulization Given 05/19/23 0133)  dexamethasone (DECADRON) tablet 8 mg (8 mg Oral Given 05/19/23 0133)    ED Course/ Medical Decision Making/ A&P Clinical Course as of 05/19/23 0207  Sun May 19, 2023  0130 Patient presents for his 15th ER visit in 6 months.  Patient reports he had recent difficulty getting his medications due to lack of coverage, but he is now back on Medicaid.  He reports he can now afford his medicines, and is willing to start taking his daily medicines.  Singulair and Advair have been ordered.  Encouraged  patient to have close follow-up with his PCP if.  Will give nebs and Decadron here [DW]  0207 Patient with improved wheezing, he is in no acute distress.  We discussed next steps, discussed appropriate use of his albuterol, and maintenance medications that were prescribed for him.  He has PCP follow-up on August 14 [DW]    Clinical Course User Index [DW] Zadie Rhine, MD                                 Medical Decision Making Risk Prescription drug management.  Patient with  history of poorly controlled asthma, recently had limitations in obtaining medications due to price He has had multiple ED visits, but does have PCP follow-up soon.  He has improved with albuterol and Atrovent and Decadron Low suspicion for PE or pneumonia at this time. He is safe for discharge        Final Clinical Impression(s) / ED Diagnoses Final diagnoses:  Moderate persistent asthma with exacerbation    Rx / DC Orders ED Discharge Orders          Ordered    montelukast (SINGULAIR) 10 MG tablet  Daily at bedtime        05/19/23 0111    fluticasone-salmeterol (ADVAIR HFA) 115-21 MCG/ACT inhaler  2 times daily        05/19/23 0111              Zadie Rhine, MD 05/19/23 [phone]

## 2023-05-21 ENCOUNTER — Telehealth: Payer: Self-pay

## 2023-05-21 NOTE — Transitions of Care (Post Inpatient/ED Visit) (Signed)
   05/21/2023  Name: Hector Gross MRN: [id number] DOB: May 08, 2001  Today's TOC FU Call Status: Today's TOC FU Call Status:: Unsuccessful Call (2nd Attempt) Unsuccessful Call (2nd Attempt) Date: 05/21/23  Attempted to reach the patient regarding the most recent Inpatient/ED visit.  Follow Up Plan: Additional outreach attempts will be made to reach the patient to complete the Transitions of Care (Post Inpatient/ED visit) call.   Abelino Derrick, MHA [facility]  Managed [facility] Social Worker [phone]

## 2023-05-23 ENCOUNTER — Telehealth: Payer: Self-pay

## 2023-05-23 NOTE — Transitions of Care (Post Inpatient/ED Visit) (Signed)
   05/23/2023  Name: Hector Gross MRN: [id number] DOB: [birth year]  Today's TOC FU Call Status: Today's TOC FU Call Status:: Unsuccessful Call (3rd Attempt) Unsuccessful Call (3rd Attempt) Date: 05/23/23  Attempted to reach the patient regarding the most recent Inpatient/ED visit.  Follow Up Plan: No further outreach attempts will be made at this time. We have been unable to contact the patient.  Abelino Derrick, MHA [facility]  Managed [facility] Social Worker [phone]

## 2023-05-29 ENCOUNTER — Ambulatory Visit: Payer: Medicaid Other | Admitting: Nurse Practitioner

## 2023-07-07 ENCOUNTER — Emergency Department (HOSPITAL_COMMUNITY): Payer: Medicaid Other

## 2023-07-07 ENCOUNTER — Encounter (HOSPITAL_COMMUNITY): Payer: Self-pay

## 2023-07-07 ENCOUNTER — Other Ambulatory Visit: Payer: Self-pay

## 2023-07-07 ENCOUNTER — Emergency Department (HOSPITAL_COMMUNITY)
Admission: EM | Admit: 2023-07-07 | Discharge: 2023-07-07 | Disposition: A | Discharge status: Home / Self Care | Payer: Medicaid Other | Attending: Emergency Medicine | Admitting: Emergency Medicine

## 2023-07-07 DIAGNOSIS — R0602 Shortness of breath: Secondary | ICD-10-CM | POA: Diagnosis not present

## 2023-07-07 DIAGNOSIS — Z1152 Encounter for screening for COVID-19: Secondary | ICD-10-CM | POA: Diagnosis not present

## 2023-07-07 DIAGNOSIS — J4541 Moderate persistent asthma with (acute) exacerbation: Secondary | ICD-10-CM | POA: Insufficient documentation

## 2023-07-07 DIAGNOSIS — Z7951 Long term (current) use of inhaled steroids: Secondary | ICD-10-CM | POA: Diagnosis not present

## 2023-07-07 LAB — COMPREHENSIVE METABOLIC PANEL
ALT: 23 U/L (ref 0–44)
AST: 26 U/L (ref 15–41)
Albumin: 3.7 g/dL (ref 3.5–5.0)
Alkaline Phosphatase: 58 U/L (ref 38–126)
Anion gap: 6 (ref 5–15)
BUN: 12 mg/dL (ref 6–20)
CO2: 25 mmol/L (ref 22–32)
Calcium: 8.8 mg/dL — ABNORMAL LOW (ref 8.9–10.3)
Chloride: 106 mmol/L (ref 98–111)
Creatinine, Ser: 1.22 mg/dL (ref 0.61–1.24)
GFR, Estimated: >60 mL/min (ref >60)
Glucose, Bld: 95 mg/dL (ref 70–99)
Potassium: 3.6 mmol/L (ref 3.5–5.1)
Sodium: 137 mmol/L (ref 135–145)
Total Bilirubin: 0.8 mg/dL (ref 0.3–1.2)
Total Protein: 6.8 g/dL (ref 6.5–8.1)

## 2023-07-07 LAB — RESP PANEL BY RT-PCR (RSV, FLU A&B, COVID)  RVPGX2
Influenza A by PCR: NEGATIVE
Influenza B by PCR: NEGATIVE
Resp Syncytial Virus by PCR: NEGATIVE
SARS Coronavirus 2 by RT PCR: NEGATIVE

## 2023-07-07 LAB — CBC
HCT: 43.5 % (ref 39.0–52.0)
Hemoglobin: 15.1 g/dL (ref 13.0–17.0)
MCH: 28.7 pg (ref 26.0–34.0)
MCHC: 34.7 g/dL (ref 30.0–36.0)
MCV: 82.5 fL (ref 80.0–100.0)
Platelets: 408 10*3/uL — ABNORMAL HIGH (ref 150–400)
RBC: 5.27 MIL/uL (ref 4.22–5.81)
RDW: 12.5 % (ref 11.5–15.5)
WBC: 5.6 10*3/uL (ref 4.0–10.5)
nRBC: 0 % (ref 0.0–0.2)

## 2023-07-07 MED ORDER — FLUTICASONE-SALMETEROL 115-21 MCG/ACT IN AERO: 2.0000 | INHALATION_SPRAY | Freq: Two times a day (BID) | RESPIRATORY_TRACT | 1 refills | Status: DC

## 2023-07-07 MED ORDER — ALBUTEROL SULFATE HFA 108 (90 BASE) MCG/ACT IN AERS: 1.0000 | INHALATION_SPRAY | Freq: Four times a day (QID) | RESPIRATORY_TRACT | 0 refills | Status: DC | PRN

## 2023-07-07 MED ORDER — PREDNISONE 20 MG PO TABS
60.0000 mg | ORAL_TABLET | Freq: Once | ORAL | Status: AC
  Administered 2023-07-07: 60 mg via ORAL
  Filled 2023-07-07: qty 3

## 2023-07-07 MED ORDER — PREDNISONE 10 MG PO TABS: 40.0000 mg | ORAL_TABLET | Freq: Every day | ORAL | 0 refills | Status: AC

## 2023-07-07 MED ORDER — IPRATROPIUM-ALBUTEROL 0.5-2.5 (3) MG/3ML IN SOLN
3.0000 mL | RESPIRATORY_TRACT | Status: AC
  Administered 2023-07-07 (×3): 3 mL via RESPIRATORY_TRACT
  Filled 2023-07-07: qty 6
  Filled 2023-07-07: qty 3

## 2023-07-07 NOTE — ED Provider Notes (Signed)
[place] EMERGENCY DEPARTMENT AT [facility] Provider Note   CSN: [id number] Arrival date & time: 07/07/23  0847     History  Chief Complaint  Patient presents with   Shortness of Breath    Hector Gross is a 22 y.o. male.  Patient is a 22 year old male with a past medical history of asthma presenting to the emergency department with shortness of breath.  Patient states that last night he started to have increasing shortness of breath with associated dry cough.  He reports some associated chest tightness.  He states that he ran out of his inhalers which he thinks may have caused his exacerbation.  He denies any fevers.  The patient states that he has only been taking Advair at home and did not have the rescue albuterol inhaler.  The patient has had frequent ED visits for asthma exacerbations but reports that he has been able to establish with a primary doctor to help manage his asthma.  The history is provided by the patient.  Shortness of Breath      Home Medications Prior to Admission medications   Medication Sig Start Date End Date Taking? Authorizing Provider  predniSONE (DELTASONE) 10 MG tablet Take 4 tablets (40 mg total) by mouth daily for 4 days. 07/07/23 07/11/23 Yes Theresia Lo, Benetta Spar K, DO  albuterol (VENTOLIN HFA) 108 (90 Base) MCG/ACT inhaler Inhale 1-2 puffs into the lungs every 6 (six) hours as needed for wheezing or shortness of breath. 07/07/23   Elayne Snare K, DO  fluticasone-salmeterol (ADVAIR HFA) [phone] MCG/ACT inhaler Inhale 2 puffs into the lungs 2 (two) times daily. 07/07/23   Elayne Snare K, DO  montelukast (SINGULAIR) 10 MG tablet Take 1 tablet (10 mg total) by mouth at bedtime. 05/19/23   Zadie Rhine, MD  triamcinolone cream (KENALOG) 0.1 % Apply 1 Application topically 2 (two) times daily. 02/20/23   [name], NP      Allergies    Patient has no known allergies.    Review of Systems   Review of Systems  Respiratory:   Positive for shortness of breath.     Physical Exam Updated Vital Signs BP 128/79   Pulse 75   Temp 97.7 F (36.5 C) (Oral)   Resp 18   Ht 6' (1.829 m)   Wt 93 kg   SpO2 95%   BMI 27.81 kg/m  Physical Exam Vitals and nursing note reviewed.  Constitutional:      General: He is not in acute distress.    Appearance: He is well-developed.  HENT:     Head: Normocephalic and atraumatic.     Mouth/Throat:     Mouth: Mucous membranes are moist.  Eyes:     Extraocular Movements: Extraocular movements intact.  Cardiovascular:     Rate and Rhythm: Normal rate and regular rhythm.  Pulmonary:     Effort: Pulmonary effort is normal.     Breath sounds: Wheezing (Diffuse expiratory) present.  Abdominal:     Palpations: Abdomen is soft.  Musculoskeletal:        General: Normal range of motion.     Cervical back: Normal range of motion and neck supple.     Right lower leg: No edema.     Left lower leg: No edema.  Skin:    General: Skin is warm and dry.  Neurological:     General: No focal deficit present.     Mental Status: He is alert and oriented to person, place,  and time.  Psychiatric:        Mood and Affect: Mood normal.        Behavior: Behavior normal.     ED Results / Procedures / Treatments   Labs (all labs ordered are listed, but only abnormal results are displayed) Labs Reviewed  COMPREHENSIVE METABOLIC PANEL - Abnormal; Notable for the following components:      Result Value   Calcium 8.8 (*)    All other components within normal limits  CBC - Abnormal; Notable for the following components:   Platelets 408 (*)    All other components within normal limits  RESP PANEL BY RT-PCR (RSV, FLU A&B, COVID)  [id number]    EKG EKG Interpretation Date/Time:  Sunday July 07 2023 08:57:46 EDT Ventricular Rate:  70 PR Interval:  157 QRS Duration:  71 QT Interval:  365 QTC Calculation: 394 R Axis:   74  Text Interpretation: Sinus rhythm No significant change since  last tracing Confirmed by Elayne Snare ([id number]) on 07/07/2023 9:06:10 AM  Radiology DG Chest Port 1 View  Result Date: 07/07/2023 CLINICAL DATA:  sob EXAM: PORTABLE CHEST 1 VIEW COMPARISON:  May 14, 2023 FINDINGS: The cardiomediastinal silhouette is normal in contour. No pleural effusion. No pneumothorax. No acute pleuroparenchymal abnormality. IMPRESSION: No acute cardiopulmonary abnormality. Electronically Signed   By: Meda Klinefelter M.D.   On: 07/07/2023 09:24    Procedures Procedures    Medications Ordered in ED Medications  predniSONE (DELTASONE) tablet 60 mg (60 mg Oral Given 07/07/23 0923)  ipratropium-albuterol (DUONEB) 0.5-2.5 (3) MG/3ML nebulizer solution 3 mL (3 mLs Nebulization Given 07/07/23 2725)    ED Course/ Medical Decision Making/ A&P Clinical Course as of 07/07/23 1123  Sun Jul 07, 2023  1121 Upon reassessment, the patient's breathing has significantly improved, wheezing has resolved on exam.  He is stable for discharge home and will be given refill of his inhalers and recommended close primary care follow-up. [VK]    Clinical Course User Index [VK] Rexford Maus, DO                                 Medical Decision Making This patient presents to the ED with chief complaint(s) of shortness of breath with pertinent past medical history of asthma which further complicates the presenting complaint. The complaint involves an extensive differential diagnosis and also carries with it a high risk of complications and morbidity.    The differential diagnosis includes patient does have wheezing on exam consistent with asthma exacerbation, no focal lung sounds and satting well on room air making an pneumonia unlikely, no recent viral symptoms making viral syndrome unlikely, considering atypical ACS, pneumothorax, pulmonary edema, pleural effusion  Additional history obtained: Additional history obtained from N/A Records reviewed multiple recent ED visits  ED  Course and Reassessment: On patient's arrival he is hemodynamically stable in no acute distress with no increased work of breathing.  The patient does have diffuse expiratory wheeze on exam consistent with asthma exacerbation.  He was initially valuated by triage and had EKG, labs and chest x-ray ordered.  EKG shows normal sinus rhythm without acute ischemic changes.  Patient will be started on prednisone and DuoNebs and will be closely reassessed.  Independent labs interpretation:  The following labs were independently interpreted: within normal range  Independent visualization of imaging: - I independently visualized the following imaging with scope of interpretation limited to determining acute  life threatening conditions related to emergency care: CXR, which revealed no acute disease  Consultation: - Consulted or discussed management/test interpretation w/ external professional: N/A  Consideration for admission or further workup: Patient has no emergent conditions requiring admission or further work-up at this time and is stable for discharge home with primary care follow-up  Social Determinants of health: N/A    Amount and/or Complexity of Data Reviewed Labs: ordered. Radiology: ordered.  Risk Prescription drug management.          Final Clinical Impression(s) / ED Diagnoses Final diagnoses:  Moderate persistent asthma with exacerbation    Rx / DC Orders ED Discharge Orders          Ordered    fluticasone-salmeterol (ADVAIR HFA) 115-21 MCG/ACT inhaler  2 times daily        07/07/23 1121    albuterol (VENTOLIN HFA) 108 (90 Base) MCG/ACT inhaler  Every 6 hours PRN        07/07/23 1121    predniSONE (DELTASONE) 10 MG tablet  Daily        07/07/23 1121              Rexford Maus, DO 07/07/23 1123

## 2023-07-07 NOTE — Discharge Instructions (Signed)
You were seen in the emergency department for shortness of breath.  You did have wheezing on your exam consistent with an asthma exacerbation.  We gave you steroids and nebulizer treatments in the emergency department with improvement of your symptoms.  I have given you a refill of your albuterol and Advair inhalers.  I have also given you 4 more days of steroids you should complete this as prescribed.  You should use your Advair daily as prescribed and your albuterol as needed.  You can follow-up with your primary doctor in the next few days to have your symptoms rechecked.  You should return to the emergency department if you have significantly worsening shortness of breath, severe chest pain or if you have any other new or concerning symptoms.

## 2023-07-07 NOTE — ED Triage Notes (Signed)
Pt c/o SOB and chest tightness started last night. Pt able to speak in complete sentences

## 2023-07-13 ENCOUNTER — Emergency Department (HOSPITAL_COMMUNITY)
Admission: EM | Admit: 2023-07-13 | Discharge: 2023-07-14 | Discharge status: Against Medical Advice | Payer: Medicaid Other | Attending: Emergency Medicine | Admitting: Emergency Medicine

## 2023-07-13 ENCOUNTER — Other Ambulatory Visit: Payer: Self-pay

## 2023-07-13 DIAGNOSIS — Z5321 Procedure and treatment not carried out due to patient leaving prior to being seen by health care provider: Secondary | ICD-10-CM | POA: Insufficient documentation

## 2023-07-13 DIAGNOSIS — J45909 Unspecified asthma, uncomplicated: Secondary | ICD-10-CM | POA: Insufficient documentation

## 2023-07-13 DIAGNOSIS — R062 Wheezing: Secondary | ICD-10-CM | POA: Diagnosis present

## 2023-07-13 MED ORDER — ALBUTEROL SULFATE (2.5 MG/3ML) 0.083% IN NEBU
2.5000 mg | INHALATION_SOLUTION | Freq: Once | RESPIRATORY_TRACT | Status: AC
  Administered 2023-07-14: 2.5 mg via RESPIRATORY_TRACT
  Filled 2023-07-13: qty 3

## 2023-07-13 NOTE — ED Triage Notes (Signed)
Patient reports asthma attack with SOB /wheezing and productive cough onset yesterday .

## 2023-07-14 ENCOUNTER — Observation Stay (HOSPITAL_COMMUNITY)
Admission: EM | Admit: 2023-07-14 | Discharge: 2023-07-15 | Disposition: A | Discharge status: Home / Self Care | Payer: Medicaid Other | Attending: Family Medicine | Admitting: Family Medicine

## 2023-07-14 ENCOUNTER — Encounter (HOSPITAL_COMMUNITY): Payer: Self-pay | Admitting: Family Medicine

## 2023-07-14 ENCOUNTER — Emergency Department (HOSPITAL_COMMUNITY): Payer: Medicaid Other

## 2023-07-14 DIAGNOSIS — J45909 Unspecified asthma, uncomplicated: Secondary | ICD-10-CM | POA: Diagnosis not present

## 2023-07-14 DIAGNOSIS — J45901 Unspecified asthma with (acute) exacerbation: Secondary | ICD-10-CM | POA: Diagnosis not present

## 2023-07-14 DIAGNOSIS — R0602 Shortness of breath: Secondary | ICD-10-CM | POA: Diagnosis not present

## 2023-07-14 DIAGNOSIS — R059 Cough, unspecified: Secondary | ICD-10-CM | POA: Diagnosis not present

## 2023-07-14 DIAGNOSIS — Z1152 Encounter for screening for COVID-19: Secondary | ICD-10-CM | POA: Insufficient documentation

## 2023-07-14 DIAGNOSIS — J4541 Moderate persistent asthma with (acute) exacerbation: Secondary | ICD-10-CM | POA: Diagnosis not present

## 2023-07-14 DIAGNOSIS — J4540 Moderate persistent asthma, uncomplicated: Secondary | ICD-10-CM | POA: Insufficient documentation

## 2023-07-14 DIAGNOSIS — J4521 Mild intermittent asthma with (acute) exacerbation: Principal | ICD-10-CM

## 2023-07-14 DIAGNOSIS — F1290 Cannabis use, unspecified, uncomplicated: Secondary | ICD-10-CM | POA: Diagnosis not present

## 2023-07-14 DIAGNOSIS — J069 Acute upper respiratory infection, unspecified: Secondary | ICD-10-CM | POA: Insufficient documentation

## 2023-07-14 DIAGNOSIS — R000 Tachycardia, unspecified: Secondary | ICD-10-CM | POA: Diagnosis not present

## 2023-07-14 DIAGNOSIS — Z79899 Other long term (current) drug therapy: Secondary | ICD-10-CM | POA: Insufficient documentation

## 2023-07-14 DIAGNOSIS — Z7951 Long term (current) use of inhaled steroids: Secondary | ICD-10-CM | POA: Diagnosis not present

## 2023-07-14 DIAGNOSIS — J9601 Acute respiratory failure with hypoxia: Secondary | ICD-10-CM | POA: Insufficient documentation

## 2023-07-14 DIAGNOSIS — F1210 Cannabis abuse, uncomplicated: Secondary | ICD-10-CM | POA: Diagnosis present

## 2023-07-14 DIAGNOSIS — R079 Chest pain, unspecified: Secondary | ICD-10-CM | POA: Diagnosis not present

## 2023-07-14 DIAGNOSIS — R062 Wheezing: Secondary | ICD-10-CM | POA: Diagnosis not present

## 2023-07-14 DIAGNOSIS — E876 Hypokalemia: Secondary | ICD-10-CM | POA: Insufficient documentation

## 2023-07-14 LAB — RESPIRATORY PANEL BY PCR

## 2023-07-14 LAB — CBC WITH DIFFERENTIAL/PLATELET
Abs Immature Granulocytes: 0.03 10*3/uL (ref 0.00–0.07)
Basophils Absolute: 0.1 10*3/uL (ref 0.0–0.1)
Basophils Relative: 1 %
Eosinophils Absolute: 0.7 10*3/uL — ABNORMAL HIGH (ref 0.0–0.5)
Eosinophils Relative: 9 %
HCT: 44.3 % (ref 39.0–52.0)
Hemoglobin: 14.9 g/dL (ref 13.0–17.0)
Immature Granulocytes: 0 %
Lymphocytes Relative: 12 %
Lymphs Abs: 1 10*3/uL (ref 0.7–4.0)
MCH: 27.7 pg (ref 26.0–34.0)
MCHC: 33.6 g/dL (ref 30.0–36.0)
MCV: 82.5 fL (ref 80.0–100.0)
Monocytes Absolute: 0.7 10*3/uL (ref 0.1–1.0)
Monocytes Relative: 8 %
Neutro Abs: 5.6 10*3/uL (ref 1.7–7.7)
Neutrophils Relative %: 70 %
Platelets: 357 10*3/uL (ref 150–400)
RBC: 5.37 MIL/uL (ref 4.22–5.81)
RDW: 12.9 % (ref 11.5–15.5)
WBC: 8.1 10*3/uL (ref 4.0–10.5)
nRBC: 0 % (ref 0.0–0.2)

## 2023-07-14 LAB — I-STAT CHEM 8, ED
BUN: 10 mg/dL (ref 6–20)
Calcium, Ion: 1.13 mmol/L — ABNORMAL LOW (ref 1.15–1.40)
Chloride: 108 mmol/L (ref 98–111)
Creatinine, Ser: 1.1 mg/dL (ref 0.61–1.24)
Glucose, Bld: 100 mg/dL — ABNORMAL HIGH (ref 70–99)
HCT: 44 % (ref 39.0–52.0)
Hemoglobin: 15 g/dL (ref 13.0–17.0)
Potassium: 3 mmol/L — ABNORMAL LOW (ref 3.5–5.1)
Sodium: 139 mmol/L (ref 135–145)
TCO2: 21 mmol/L — ABNORMAL LOW (ref 22–32)

## 2023-07-14 LAB — RESP PANEL BY RT-PCR (RSV, FLU A&B, COVID)  RVPGX2
Influenza A by PCR: NEGATIVE
Influenza B by PCR: NEGATIVE
Resp Syncytial Virus by PCR: NEGATIVE
SARS Coronavirus 2 by RT PCR: NEGATIVE

## 2023-07-14 LAB — POTASSIUM: Potassium: 4.1 mmol/L (ref 3.5–5.1)

## 2023-07-14 MED ORDER — MONTELUKAST SODIUM 10 MG PO TABS
10.0000 mg | ORAL_TABLET | Freq: Every day | ORAL | Status: DC
  Administered 2023-07-14: 10 mg via ORAL
  Filled 2023-07-14: qty 1

## 2023-07-14 MED ORDER — ALBUTEROL SULFATE (2.5 MG/3ML) 0.083% IN NEBU
10.0000 mg/h | INHALATION_SOLUTION | Freq: Once | RESPIRATORY_TRACT | Status: AC
  Administered 2023-07-14: 10 mg/h via RESPIRATORY_TRACT
  Filled 2023-07-14: qty 12

## 2023-07-14 MED ORDER — ALBUTEROL SULFATE (2.5 MG/3ML) 0.083% IN NEBU
2.5000 mg | INHALATION_SOLUTION | RESPIRATORY_TRACT | Status: DC
  Administered 2023-07-14 – 2023-07-15 (×6): 2.5 mg via RESPIRATORY_TRACT
  Filled 2023-07-14 (×6): qty 3

## 2023-07-14 MED ORDER — POTASSIUM CHLORIDE CRYS ER 20 MEQ PO TBCR
40.0000 meq | EXTENDED_RELEASE_TABLET | Freq: Once | ORAL | Status: AC
  Administered 2023-07-14: 40 meq via ORAL
  Filled 2023-07-14: qty 2

## 2023-07-14 MED ORDER — ENOXAPARIN SODIUM 40 MG/0.4ML IJ SOSY
40.0000 mg | PREFILLED_SYRINGE | INTRAMUSCULAR | Status: DC
  Administered 2023-07-14 – 2023-07-15 (×2): 40 mg via SUBCUTANEOUS
  Filled 2023-07-14 (×2): qty 0.4

## 2023-07-14 MED ORDER — ALBUTEROL SULFATE (2.5 MG/3ML) 0.083% IN NEBU
2.5000 mg | INHALATION_SOLUTION | RESPIRATORY_TRACT | Status: DC | PRN
  Administered 2023-07-14: 2.5 mg via RESPIRATORY_TRACT
  Filled 2023-07-14: qty 3

## 2023-07-14 MED ORDER — METHYLPREDNISOLONE SODIUM SUCC 40 MG IJ SOLR
40.0000 mg | Freq: Two times a day (BID) | INTRAMUSCULAR | Status: DC
  Administered 2023-07-14 – 2023-07-15 (×3): 40 mg via INTRAVENOUS
  Filled 2023-07-14 (×3): qty 1

## 2023-07-14 MED ORDER — POTASSIUM CHLORIDE CRYS ER 20 MEQ PO TBCR
20.0000 meq | EXTENDED_RELEASE_TABLET | Freq: Once | ORAL | Status: AC
  Administered 2023-07-14: 20 meq via ORAL
  Filled 2023-07-14: qty 1

## 2023-07-14 MED ORDER — SODIUM CHLORIDE 0.9 % IV BOLUS
500.0000 mL | Freq: Once | INTRAVENOUS | Status: AC
  Administered 2023-07-14: 500 mL via INTRAVENOUS

## 2023-07-14 NOTE — ED Provider Notes (Signed)
[place] EMERGENCY DEPARTMENT AT [facility] Provider Note   CSN: [id number] Arrival date & time: 07/14/23  4098     History  Chief Complaint  Patient presents with   Shortness of Breath    Hector Gross is a 22 y.o. male.  The history is provided by the patient.  Shortness of Breath Severity:  Severe Onset quality:  Sudden Timing:  Constant Progression:  Unchanged Chronicity:  Recurrent Context: URI   Relieved by:  Nothing Worsened by:  Nothing Ineffective treatments:  None tried Associated symptoms: cough and wheezing   Associated symptoms: no fever and no vomiting   Risk factors: no recent alcohol use and no hx of PE/DVT   Patient with asthma with SOB and congestion and home inhaler was not working.  Given nebs, magnesium and solumedrol by EMS.       Home Medications Prior to Admission medications   Medication Sig Start Date End Date Taking? Authorizing Provider  albuterol (VENTOLIN HFA) 108 (90 Base) MCG/ACT inhaler Inhale 1-2 puffs into the lungs every 6 (six) hours as needed for wheezing or shortness of breath. 07/07/23   Elayne Snare K, DO  fluticasone-salmeterol (ADVAIR HFA) [phone] MCG/ACT inhaler Inhale 2 puffs into the lungs 2 (two) times daily. 07/07/23   Elayne Snare K, DO  montelukast (SINGULAIR) 10 MG tablet Take 1 tablet (10 mg total) by mouth at bedtime. 05/19/23   Zadie Rhine, MD  triamcinolone cream (KENALOG) 0.1 % Apply 1 Application topically 2 (two) times daily. 02/20/23   [name], NP      Allergies    Patient has no known allergies.    Review of Systems   Review of Systems  Constitutional:  Negative for fever.  HENT:  Positive for congestion. Negative for facial swelling.   Eyes:  Negative for pain.  Respiratory:  Positive for cough, shortness of breath and wheezing.   Gastrointestinal:  Negative for vomiting.  All other systems reviewed and are negative.   Physical Exam Updated Vital Signs BP (!)  135/92 (BP Location: Right Arm)   Pulse (!) 115   Temp 98.8 F (37.1 C) (Temporal)   Resp (!) 21   SpO2 93%  Physical Exam Vitals and nursing note reviewed.  Constitutional:      General: He is not in acute distress.    Appearance: He is well-developed. He is not diaphoretic.  HENT:     Head: Normocephalic and atraumatic.     Nose: Congestion present.  Eyes:     Conjunctiva/sclera: Conjunctivae normal.     Pupils: Pupils are equal, round, and reactive to light.  Cardiovascular:     Rate and Rhythm: Regular rhythm. Tachycardia present.     Pulses: Normal pulses.     Heart sounds: Normal heart sounds.  Pulmonary:     Effort: Pulmonary effort is normal. Tachypnea present.     Breath sounds: Wheezing present. No rales.  Abdominal:     General: Bowel sounds are normal.     Palpations: Abdomen is soft.     Tenderness: There is no abdominal tenderness. There is no guarding or rebound.  Musculoskeletal:        General: Normal range of motion.     Cervical back: Normal range of motion and neck supple.  Skin:    General: Skin is warm and dry.  Neurological:     Mental Status: He is alert and oriented to person, place, and time.     ED Results /  Procedures / Treatments   Labs (all labs ordered are listed, but only abnormal results are displayed) Results for orders placed or performed during the hospital encounter of 07/14/23  CBC with Differential  Result Value Ref Range   WBC 8.1 4.0 - 10.5 K/uL   RBC 5.37 4.22 - 5.81 MIL/uL   Hemoglobin 14.9 13.0 - 17.0 g/dL   HCT 46.9 62.9 - 52.8 %   MCV 82.5 80.0 - 100.0 fL   MCH 27.7 26.0 - 34.0 pg   MCHC 33.6 30.0 - 36.0 g/dL   RDW 41.3 24.4 - 01.0 %   Platelets 357 150 - 400 K/uL   nRBC 0.0 0.0 - 0.2 %   Neutrophils Relative % 70 %   Neutro Abs 5.6 1.7 - 7.7 K/uL   Lymphocytes Relative 12 %   Lymphs Abs 1.0 0.7 - 4.0 K/uL   Monocytes Relative 8 %   Monocytes Absolute 0.7 0.1 - 1.0 K/uL   Eosinophils Relative 9 %   Eosinophils  Absolute 0.7 (H) 0.0 - 0.5 K/uL   Basophils Relative 1 %   Basophils Absolute 0.1 0.0 - 0.1 K/uL   Immature Granulocytes 0 %   Abs Immature Granulocytes 0.03 0.00 - 0.07 K/uL  I-stat chem 8, ED (not at [facility], [facility] or [facility])  Result Value Ref Range   Sodium 139 135 - 145 mmol/L   Potassium 3.0 (L) 3.5 - 5.1 mmol/L   Chloride 108 98 - 111 mmol/L   BUN 10 6 - 20 mg/dL   Creatinine, Ser 2.72 0.61 - 1.24 mg/dL   Glucose, Bld 536 (H) 70 - 99 mg/dL   Calcium, Ion 6.44 (L) 1.15 - 1.40 mmol/L   TCO2 21 (L) 22 - 32 mmol/L   Hemoglobin 15.0 13.0 - 17.0 g/dL   HCT 03.4 74.2 - 59.5 %   DG Chest 2 View  Result Date: 07/14/2023 CLINICAL DATA:  Shortness of breath, cough, chest pain. History of asthma. EXAM: CHEST - 2 VIEW COMPARISON:  07/07/2023. FINDINGS: The heart size and mediastinal contours are within normal limits. No consolidation, effusion, or pneumothorax. No acute osseous abnormality. IMPRESSION: No active cardiopulmonary disease. Electronically Signed   By: [name] M.D.   On: 07/14/2023 00:36   DG Chest Port 1 View  Result Date: 07/07/2023 CLINICAL DATA:  sob EXAM: PORTABLE CHEST 1 VIEW COMPARISON:  May 14, 2023 FINDINGS: The cardiomediastinal silhouette is normal in contour. No pleural effusion. No pneumothorax. No acute pleuroparenchymal abnormality. IMPRESSION: No acute cardiopulmonary abnormality. Electronically Signed   By: Meda Klinefelter M.D.   On: 07/07/2023 09:24     EKG  EKG Interpretation Date/Time:  Sunday July 14 2023 05:14:53 EDT Ventricular Rate:  111 PR Interval:  138 QRS Duration:  76 QT Interval:  367 QTC Calculation: 499 R Axis:   69  Text Interpretation: Sinus tachycardia RSR' in V1 or V2, probably normal variant Confirmed by Nicanor Alcon, Maylie Ashton ([id number]) on 07/14/2023 5:48:39 AM         Radiology DG Chest 2 View  Result Date: 07/14/2023 CLINICAL DATA:  Shortness of breath, cough, chest pain. History of asthma. EXAM: CHEST - 2 VIEW COMPARISON:   07/07/2023. FINDINGS: The heart size and mediastinal contours are within normal limits. No consolidation, effusion, or pneumothorax. No acute osseous abnormality. IMPRESSION: No active cardiopulmonary disease. Electronically Signed   By: [name] M.D.   On: 07/14/2023 00:36    Procedures .Critical Care E&M  Performed by: Cy Blamer, MD Critical care provider  statement:    Critical care end time:  07/14/2023 5:53 AM   Critical care was necessary to treat or prevent imminent or life-threatening deterioration of the following conditions:  Respiratory failure   Critical care was time spent personally by me on the following activities:  Ordering and performing treatments and interventions, ordering and review of laboratory studies, ordering and review of radiographic studies, pulse oximetry, re-evaluation of patient's condition and review of old charts   Care discussed with: admitting provider   After initial E/M assessment, critical care services were subsequently performed that were exclusive of separately billable procedures or treatment.       Medications Ordered in ED Medications  albuterol (PROVENTIL) (2.5 MG/3ML) 0.083% nebulizer solution (10 mg/hr Nebulization Given 07/14/23 0535)  sodium chloride 0.9 % bolus 500 mL (500 mLs Intravenous New Bag/Given 07/14/23 0525)    ED Course/ Medical Decision Making/ A&P                                 Medical Decision Making Patient with asthma without relief with home inhalers   Amount and/or Complexity of Data Reviewed Independent Historian: EMS    Details: See above  External Data Reviewed: notes.    Details: Previous notes reviewed  Labs: ordered.    Details: Normal white count 8.1, normal hemoglobin 14.9, normal platelets.  Normal sodium 139, low potassium 3, normal creatinine  Radiology: ordered and independent interpretation performed.    Details: No PNA ECG/medicine tests: ordered and independent interpretation performed.  Decision-making details documented in ED Course.  Risk Prescription drug management. Decision regarding hospitalization.  Critical Care Total time providing critical care: 30 minutes (CAT)    Final Clinical Impression(s) / ED Diagnoses Final diagnoses:  Mild intermittent asthma with exacerbation   The patient appears reasonably stabilized for admission considering the current resources, flow, and capabilities available in the ED at this time, and I doubt any other [facility] requiring further screening and/or treatment in the ED prior to admission.  Rx / DC Orders ED Discharge Orders     None         Appollonia Klee, MD 07/14/23 5284

## 2023-07-14 NOTE — H&P (Signed)
History and Physical    Patient: Hector Gross YQM:[id number] DOB: [birth year] DOA: 07/14/2023 DOS: the patient was seen and examined on 07/14/2023 PCP: [name], NP  Patient coming from: Home  Chief Complaint:  Chief Complaint  Patient presents with   Shortness of Breath   HPI: Hector Gross is a 22 y.o. male with medical history significant of asthma. Patient presented secondary to one day of progressively worsening dyspnea and wheezing. Patient reports using albuterol but it was not helping with symptoms. Associated mildly productive cough. Some mild chest pain and rhinorrhea. No fevers, chills, diaphoresis. No sick contacts. He is generally triggered by smoke inhalation and possibly his dog. He reports smoking marijuana, which likely contributed.  EMS gave albuterol, Atrovent, solumedrol and magnesium with improvement. Patient received continuous albuterol while in the ED.  Review of Systems: As mentioned in the history of present illness. All other systems reviewed and are negative.  Past Medical History:  Diagnosis Date   Asthma    No past surgical history on file. Social History:  reports that he has never smoked. He has never used smokeless tobacco. He reports current drug use. Drug: Marijuana. He reports that he does not drink alcohol.  No Known Allergies  Family History  Problem Relation Age of Onset   Diabetes Maternal Grandmother     Prior to Admission medications   Medication Sig Start Date End Date Taking? Authorizing Provider  albuterol (VENTOLIN HFA) 108 (90 Base) MCG/ACT inhaler Inhale 1-2 puffs into the lungs every 6 (six) hours as needed for wheezing or shortness of breath. 07/07/23  Yes Theresia Lo, Benetta Spar K, DO  triamcinolone cream (KENALOG) 0.1 % Apply 1 Application topically 2 (two) times daily. Patient taking differently: Apply 1 Application topically daily as needed (for itching). 02/20/23  Yes [name], NP  fluticasone-salmeterol (ADVAIR  HFA) 528-41 MCG/ACT inhaler Inhale 2 puffs into the lungs 2 (two) times daily. Patient not taking: Reported on 07/14/2023 07/07/23   Elayne Snare K, DO  montelukast (SINGULAIR) 10 MG tablet Take 1 tablet (10 mg total) by mouth at bedtime. Patient not taking: Reported on 07/14/2023 05/19/23   Zadie Rhine, MD    Physical Exam: Vitals:   07/14/23 0515 07/14/23 0530 07/14/23 0545 07/14/23 0615  BP: (!) 149/84 138/86 134/78 125/74  Pulse: (!) 115 (!) 112 (!) 104 (!) 114  Resp: 20 20 (!) 27 (!) 22  Temp:      TempSrc:      SpO2: 92% 94% 96% 92%   General exam: Appears calm and comfortable and in no acute distress. Conversant Respiratory: Diminished with bilateral wheezing and poor air movement. Respiratory effort normal with no intercostal retractions or use of accessory muscles Cardiovascular: S1 & S2 heard, RRR. No murmurs, rubs, gallops or clicks. No edema Gastrointestinal: Abdomen is non-distended, soft and non-tender. No masses felt. Normal bowel sounds heard Neurologic: No focal neurological deficits Musculoskeletal: No calf tenderness Skin: No cyanosis. No new rashes Psychiatry: Alert and oriented x4. Memory intact. Mood & affect appropriate  Data Reviewed: There are no new results to review at this time.  Assessment and Plan:  Asthma exacerbation Patient with poorly controlled as indicated by frequent ED visits for exacerbations. Patient is not on controlled medication secondary to inability to obtain/medication non-adherence. -Continue albuterol q4 hours scheduled and q2 hours prn -Continue methylprednisolone 40 mg IV BID for today -Continue Singulair  Acute respiratory failure with hypoxia Secondary to asthma exacerbation. Patient has been stable on 3  L/min via nasal canula. -Wean to room air as able -Ambulatory pulse ox prior to discharge  Marijuana use Patient counseled on cessation on admission. Likely contributing to asthma  exacerbation.  Hypokalemia -Potassium supplementation -Recheck potassium   Advance Care Planning:   Code Status: Full Code  Consults: None  Family Communication: None at bedside   Author: Jacquelin Hawking, MD 07/14/2023 7:42 AM  For on call review [email or web address].

## 2023-07-14 NOTE — ED Notes (Signed)
ED TO INPATIENT HANDOFF REPORT  ED Nurse Name and Phone #: Osvaldo Shipper RN [phone]  S Name/Age/Gender Hector Gross 22 y.o. male Room/Bed: 003C/003C  Code Status   Code Status: Prior  Home/SNF/Other Home Patient oriented to: self, place, time, and situation Is this baseline? Yes   Triage Complete: Triage complete  Chief Complaint Ashma Issues  Triage Note Patient with asthma arrives via EMS after LWBS earlier tonight-sob, wheezing since Saturday morning. Has been sick with nasal congestion, productive cough with yellow sputum, and sore throat x 2 days. EMS found him to have inspiratory and expiratory wheezing on their arrival and was 92% SpO2 on room air. They gave 15mg  albuterol, 1 mg atrovent, 125mg  solumedrol, 2g magnesium with some improvement but reports he is beginning to feel sob again.    Allergies No Known Allergies  Level of Care/Admitting Diagnosis ED Disposition     ED Disposition  Admit   Condition  --   Comment  The patient appears reasonably stabilized for admission considering the current resources, flow, and capabilities available in the ED at this time, and I doubt any other [facility] requiring further screening and/or treatment in the ED prior to admission is  present.          B Medical/Surgery History Past Medical History:  Diagnosis Date   Asthma    No past surgical history on file.   A IV Location/Drains/Wounds Patient Lines/Drains/Airways Status     Active Line/Drains/Airways     Name Placement date Placement time Site Days   Peripheral IV 07/14/23 20 G Left;Posterior Hand 07/14/23  0516  Hand  less than 1            Intake/Output Last 24 hours  Intake/Output Summary (Last 24 hours) at 07/14/2023 0752 Last data filed at 07/14/2023 5284 Gross per 24 hour  Intake 500 ml  Output --  Net 500 ml    Labs/Imaging Results for orders placed or performed during the hospital encounter of 07/14/23 (from the past 48 hour(s))  CBC  with Differential     Status: Abnormal   Collection Time: 07/14/23  5:27 AM  Result Value Ref Range   WBC 8.1 4.0 - 10.5 K/uL   RBC 5.37 4.22 - 5.81 MIL/uL   Hemoglobin 14.9 13.0 - 17.0 g/dL   HCT 13.2 44.0 - 10.2 %   MCV 82.5 80.0 - 100.0 fL   MCH 27.7 26.0 - 34.0 pg   MCHC 33.6 30.0 - 36.0 g/dL   RDW 72.5 36.6 - 44.0 %   Platelets 357 150 - 400 K/uL   nRBC 0.0 0.0 - 0.2 %   Neutrophils Relative % 70 %   Neutro Abs 5.6 1.7 - 7.7 K/uL   Lymphocytes Relative 12 %   Lymphs Abs 1.0 0.7 - 4.0 K/uL   Monocytes Relative 8 %   Monocytes Absolute 0.7 0.1 - 1.0 K/uL   Eosinophils Relative 9 %   Eosinophils Absolute 0.7 (H) 0.0 - 0.5 K/uL   Basophils Relative 1 %   Basophils Absolute 0.1 0.0 - 0.1 K/uL   Immature Granulocytes 0 %   Abs Immature Granulocytes 0.03 0.00 - 0.07 K/uL    Comment: Performed at [facility], [address] [place], [place] [address]  I-stat chem 8, ED (not at [facility], [facility] or [facility])     Status: Abnormal   Collection Time: 07/14/23  5:45 AM  Result Value Ref Range   Sodium 139 135 - 145 mmol/L  Potassium 3.0 (L) 3.5 - 5.1 mmol/L   Chloride 108 98 - 111 mmol/L   BUN 10 6 - 20 mg/dL   Creatinine, Ser 2.84 0.61 - 1.24 mg/dL   Glucose, Bld 132 (H) 70 - 99 mg/dL    Comment: Glucose reference range applies only to samples taken after fasting for at least 8 hours.   Calcium, Ion 1.13 (L) 1.15 - 1.40 mmol/L   TCO2 21 (L) 22 - 32 mmol/L   Hemoglobin 15.0 13.0 - 17.0 g/dL   HCT 44.0 10.2 - 72.5 %   DG Chest Portable 1 View  Result Date: 07/14/2023 CLINICAL DATA:  Shortness of breath. EXAM: PORTABLE CHEST 1 VIEW COMPARISON:  07/14/2023 FINDINGS: The heart size and mediastinal contours are within normal limits. Both lungs are clear. The visualized skeletal structures are unremarkable. IMPRESSION: No active disease. Electronically Signed   By: Signa Kell M.D.   On: 07/14/2023 06:17   DG Chest 2 View  Result Date: 07/14/2023 CLINICAL DATA:  Shortness of breath,  cough, chest pain. History of asthma. EXAM: CHEST - 2 VIEW COMPARISON:  07/07/2023. FINDINGS: The heart size and mediastinal contours are within normal limits. No consolidation, effusion, or pneumothorax. No acute osseous abnormality. IMPRESSION: No active cardiopulmonary disease. Electronically Signed   By: [name] M.D.   On: 07/14/2023 00:36    Pending Labs Unresulted Labs (From admission, onward)     Start     Ordered   07/14/23 0521  Resp panel by RT-PCR (RSV, Flu A&B, Covid) Anterior Nasal Swab  Once,   URGENT       Question Answer Comment  Patient immune status Normal   Release to patient Immediate      07/14/23 0521            Vitals/Pain Today's Vitals   07/14/23 0526 07/14/23 0530 07/14/23 0545 07/14/23 0615  BP:  138/86 134/78 125/74  Pulse:  (!) 112 (!) 104 (!) 114  Resp:  20 (!) 27 (!) 22  Temp:      TempSrc:      SpO2:  94% 96% 92%  PainSc: 0-No pain       Isolation Precautions No active isolations  Medications Medications  albuterol (PROVENTIL) (2.5 MG/3ML) 0.083% nebulizer solution (10 mg/hr Nebulization Given 07/14/23 0535)  sodium chloride 0.9 % bolus 500 mL (0 mLs Intravenous Stopped 07/14/23 0622)  potassium chloride SA (KLOR-CON M) CR tablet 20 mEq (20 mEq Oral Given 07/14/23 0622)    Mobility walks     Focused Assessments Pulmonary Assessment Handoff:  Lung sounds: Bilateral Breath Sounds: Expiratory wheezes, Inspiratory wheezes L Breath Sounds: Inspiratory wheezes, Expiratory wheezes R Breath Sounds: Expiratory wheezes, Inspiratory wheezes O2 Device: Nasal Cannula O2 Flow Rate (L/min): 3 L/min    R Recommendations: See Admitting Provider Note  Report given to:   Additional Notes:

## 2023-07-14 NOTE — ED Notes (Signed)
Transfer of care report received from previous RN, Casimiro Needle.

## 2023-07-14 NOTE — ED Triage Notes (Signed)
Patient with asthma arrives via EMS after LWBS earlier tonight-sob, wheezing since Saturday morning. Has been sick with nasal congestion, productive cough with yellow sputum, and sore throat x 2 days. EMS found him to have inspiratory and expiratory wheezing on their arrival and was 92% SpO2 on room air. They gave 15mg  albuterol, 1 mg atrovent, 125mg  solumedrol, 2g magnesium with some improvement but reports he is beginning to feel sob again.

## 2023-07-14 NOTE — ED Notes (Signed)
Pt called multiple time no answer

## 2023-07-15 DIAGNOSIS — J4540 Moderate persistent asthma, uncomplicated: Secondary | ICD-10-CM | POA: Insufficient documentation

## 2023-07-15 DIAGNOSIS — J4541 Moderate persistent asthma with (acute) exacerbation: Secondary | ICD-10-CM | POA: Diagnosis not present

## 2023-07-15 DIAGNOSIS — E876 Hypokalemia: Secondary | ICD-10-CM | POA: Insufficient documentation

## 2023-07-15 DIAGNOSIS — J069 Acute upper respiratory infection, unspecified: Secondary | ICD-10-CM | POA: Insufficient documentation

## 2023-07-15 DIAGNOSIS — J45901 Unspecified asthma with (acute) exacerbation: Secondary | ICD-10-CM | POA: Diagnosis not present

## 2023-07-15 DIAGNOSIS — J9601 Acute respiratory failure with hypoxia: Secondary | ICD-10-CM | POA: Insufficient documentation

## 2023-07-15 MED ORDER — PREDNISONE 20 MG PO TABS: 40.0000 mg | ORAL_TABLET | Freq: Every day | ORAL | 0 refills | Status: AC

## 2023-07-15 MED ORDER — ALBUTEROL SULFATE (2.5 MG/3ML) 0.083% IN NEBU: 2.5000 mg | INHALATION_SOLUTION | Freq: Four times a day (QID) | RESPIRATORY_TRACT | Status: DC

## 2023-07-15 MED ORDER — ALBUTEROL SULFATE HFA 108 (90 BASE) MCG/ACT IN AERS: INHALATION_SPRAY | RESPIRATORY_TRACT | 0 refills | Status: DC

## 2023-07-15 MED ORDER — ONDANSETRON HCL 4 MG/2ML IJ SOLN
4.0000 mg | Freq: Four times a day (QID) | INTRAMUSCULAR | Status: DC | PRN
  Administered 2023-07-15: 4 mg via INTRAVENOUS
  Filled 2023-07-15: qty 2

## 2023-07-15 MED ORDER — ONDANSETRON HCL 4 MG PO TABS: 4.0000 mg | ORAL_TABLET | Freq: Three times a day (TID) | ORAL | 0 refills | Status: AC | PRN

## 2023-07-15 NOTE — Discharge Summary (Signed)
Physician Discharge Summary   Patient: Hector Gross MRN: [id number] DOB: 20-Jul-2001  Admit date:     07/14/2023  Discharge date: 07/15/23  Discharge Physician: Jacquelin Hawking, MD   PCP: [name], NP   Recommendations at discharge:  PCP visit for hospital follow-up  Discharge Diagnoses: Principal Problem:   Asthma exacerbation Active Problems:   Marijuana abuse   Hypokalemia   Acute respiratory failure with hypoxia (HCC)   Upper respiratory infection   Moderate persistent asthma  Resolved Problems:   * No resolved hospital problems. *  Hospital Course: Hector Gross is a 22 y.o. male with a history of asthma and marijuana use.  Patient presented secondary to dyspnea and was found to have evidence of an asthma exacerbation. Patient was given solumedrol, magnesium and albuterol for initial treatment. He had associated hypoxia requiring supplemental oxygen. RVP positive for rhinovirus/enterovirus. Patient improved with Solu-medrol and albuterol. Patient to continue bronchodilators and steroids on discharge. Follow-up with PCP.  Assessment and Plan:  Asthma exacerbation Patient with poorly controlled as indicated by frequent ED visits for exacerbations. Patient is not on controlled medication secondary to inability to obtain/medication non-adherence. Exacerbation possibly multifactorial and related to identification of rhinovirus/enterovirus infection and/or marijuana use. Patient managed successfully on albuterol scheduled and solu-medrol. Patient transitioned to prednisone to complete steroid burst. Patient to continue home albuterol q4 hours while awake for the next 3 days followed by as needed dosing. Patient instructed to continue his Advair (confirmed that this is $4 at his pharmacy) on discharge for management of persistent moderate asthma. Recommend to continue Singulair as well. Recommend to follow-up with allergy and asthma center.   Acute respiratory failure with  hypoxia Secondary to asthma exacerbation. Patient was managed initially on 3 L/min via nasal canula. Patient weaned to room air and was able to ambulate without the need for supplemental oxygen.  Rhinovirus/enterovirus infection Supportive care.   Marijuana use Patient counseled on cessation on admission. Likely contributing to asthma exacerbation.   Hypokalemia Mild. Resolved with potassium supplementation.   Consultants: None Procedures performed: None  Disposition: Home Diet recommendation: Regular diet   DISCHARGE MEDICATION: Allergies as of 07/15/2023   No Known Allergies      Medication List     TAKE these medications    albuterol 108 (90 Base) MCG/ACT inhaler Commonly known as: Ventolin HFA Inhale 2 puffs into the lungs every 4 (four) hours while awake for 3 days, THEN 1-2 puffs every 4 (four) hours as needed for wheezing or shortness of breath. Start taking on: July 15, 2023 What changed: See the new instructions.   fluticasone-salmeterol 115-21 MCG/ACT inhaler Commonly known as: Advair HFA Inhale 2 puffs into the lungs 2 (two) times daily.   montelukast 10 MG tablet Commonly known as: Singulair Take 1 tablet (10 mg total) by mouth at bedtime.   ondansetron 4 MG tablet Commonly known as: Zofran Take 1 tablet (4 mg total) by mouth every 8 (eight) hours as needed for nausea or vomiting.   predniSONE 20 MG tablet Commonly known as: DELTASONE Take 2 tablets (40 mg total) by mouth daily with breakfast for 3 days. Start taking on: July 16, 2023   triamcinolone cream 0.1 % Commonly known as: KENALOG Apply 1 Application topically 2 (two) times daily. What changed:  when to take this reasons to take this        Discharge Exam: BP 136/75 (BP Location: Left Arm)   Pulse 80   Temp 98 F (36.7  C) (Oral)   Resp 18   SpO2 91%   General exam: Appears calm and comfortable Respiratory system: Bilateral wheezing. Respiratory effort  normal. Cardiovascular system: S1 & S2 heard, RRR. No murmurs, rubs, gallops or clicks. Gastrointestinal system: Abdomen is nondistended, soft and nontender. Normal bowel sounds heard. Central nervous system: Alert and oriented. No focal neurological deficits. Musculoskeletal: No edema. No calf tenderness Skin: No cyanosis. No rashes Psychiatry: Judgement and insight appear normal. Mood & affect appropriate.   Condition at discharge: stable  The results of significant diagnostics from this hospitalization (including imaging, microbiology, ancillary and laboratory) are listed below for reference.   Imaging Studies: DG Chest Portable 1 View  Result Date: 07/14/2023 CLINICAL DATA:  Shortness of breath. EXAM: PORTABLE CHEST 1 VIEW COMPARISON:  07/14/2023 FINDINGS: The heart size and mediastinal contours are within normal limits. Both lungs are clear. The visualized skeletal structures are unremarkable. IMPRESSION: No active disease. Electronically Signed   By: Signa Kell M.D.   On: 07/14/2023 06:17   DG Chest 2 View  Result Date: 07/14/2023 CLINICAL DATA:  Shortness of breath, cough, chest pain. History of asthma. EXAM: CHEST - 2 VIEW COMPARISON:  07/07/2023. FINDINGS: The heart size and mediastinal contours are within normal limits. No consolidation, effusion, or pneumothorax. No acute osseous abnormality. IMPRESSION: No active cardiopulmonary disease. Electronically Signed   By: [name] M.D.   On: 07/14/2023 00:36   DG Chest Port 1 View  Result Date: 07/07/2023 CLINICAL DATA:  sob EXAM: PORTABLE CHEST 1 VIEW COMPARISON:  May 14, 2023 FINDINGS: The cardiomediastinal silhouette is normal in contour. No pleural effusion. No pneumothorax. No acute pleuroparenchymal abnormality. IMPRESSION: No acute cardiopulmonary abnormality. Electronically Signed   By: Meda Klinefelter M.D.   On: 07/07/2023 09:24    Microbiology: Results for orders placed or performed during the hospital encounter  of 07/14/23  Resp panel by RT-PCR (RSV, Flu A&B, Covid) Anterior Nasal Swab     Status: None   Collection Time: 07/14/23  5:27 AM   Specimen: Anterior Nasal Swab  Result Value Ref Range Status   SARS Coronavirus 2 by RT PCR NEGATIVE NEGATIVE Final   Influenza A by PCR NEGATIVE NEGATIVE Final   Influenza B by PCR NEGATIVE NEGATIVE Final    Comment: (NOTE) The Xpert Xpress SARS-CoV-2/FLU/RSV plus assay is intended as an aid in the diagnosis of influenza from Nasopharyngeal swab specimens and should not be used as a sole basis for treatment. Nasal washings and aspirates are unacceptable for Xpert Xpress SARS-CoV-2/FLU/RSV testing.  Fact Sheet for Patients: BloggerCourse.com  Fact Sheet for Healthcare Providers: SeriousBroker.it  This test is not yet approved or cleared by the [place] FDA and has been authorized for detection and/or diagnosis of SARS-CoV-2 by FDA under an Emergency Use Authorization (EUA). This EUA will remain in effect (meaning this test can be used) for the duration of the COVID-19 declaration under Section 564(b)(1) of the Act, 21 U.S.C. section 360bbb-3(b)(1), unless the authorization is terminated or revoked.     Resp Syncytial Virus by PCR NEGATIVE NEGATIVE Final    Comment: (NOTE) Fact Sheet for Patients: BloggerCourse.com  Fact Sheet for Healthcare Providers: SeriousBroker.it  This test is not yet approved or cleared by the [place] FDA and has been authorized for detection and/or diagnosis of SARS-CoV-2 by FDA under an Emergency Use Authorization (EUA). This EUA will remain in effect (meaning this test can be used) for the duration of the COVID-19 declaration under Section 564(b)(1) of  the Act, 21 U.S.C. section 360bbb-3(b)(1), unless the authorization is terminated or revoked.  Performed at [facility], [address],  [place], [place] [address]   Respiratory (~20 pathogens) panel by PCR     Status: Abnormal   Collection Time: 07/14/23 10:55 AM   Specimen: Nasopharyngeal Swab; Respiratory  Result Value Ref Range Status   Adenovirus NOT DETECTED NOT DETECTED Final   Coronavirus 229E NOT DETECTED NOT DETECTED Final    Comment: (NOTE) The Coronavirus on the Respiratory Panel, DOES NOT test for the novel  Coronavirus (2019 nCoV)    Coronavirus HKU1 NOT DETECTED NOT DETECTED Final   Coronavirus NL63 NOT DETECTED NOT DETECTED Final   Coronavirus OC43 NOT DETECTED NOT DETECTED Final   Metapneumovirus NOT DETECTED NOT DETECTED Final   Rhinovirus / Enterovirus DETECTED (A) NOT DETECTED Final   Influenza A NOT DETECTED NOT DETECTED Final   Influenza B NOT DETECTED NOT DETECTED Final   Parainfluenza Virus 1 NOT DETECTED NOT DETECTED Final   Parainfluenza Virus 2 NOT DETECTED NOT DETECTED Final   Parainfluenza Virus 3 NOT DETECTED NOT DETECTED Final   Parainfluenza Virus 4 NOT DETECTED NOT DETECTED Final   Respiratory Syncytial Virus NOT DETECTED NOT DETECTED Final   Bordetella pertussis NOT DETECTED NOT DETECTED Final   Bordetella Parapertussis NOT DETECTED NOT DETECTED Final   Chlamydophila pneumoniae NOT DETECTED NOT DETECTED Final   Mycoplasma pneumoniae NOT DETECTED NOT DETECTED Final    Comment: Performed at [facility], [address] [place], [place] [address]    Labs: CBC: Recent Labs  Lab 07/14/23 0527 07/14/23 0545  WBC 8.1  --   NEUTROABS 5.6  --   HGB 14.9 15.0  HCT 44.3 44.0  MCV 82.5  --   PLT 357  --    Basic Metabolic Panel: Recent Labs  Lab 07/14/23 0545 07/14/23 1537  NA 139  --   K 3.0* 4.1  CL 108  --   GLUCOSE 100*  --   BUN 10  --   CREATININE 1.10  --     Discharge time spent: 35 minutes.  Signed: Jacquelin Hawking, MD [facility] 07/15/2023

## 2023-07-15 NOTE — Discharge Instructions (Signed)
Hector Gross,  You are in the hospital because of an asthma exacerbation.  This is improved with steroids and breathing treatments.  You will discharge with continued breathing treatments and steroids.  Please stop smoking as this will likely continue to worsening asthma symptoms.  Please follow-up with your primary care physician.  Please ensure that you use your Advair as directed; I check with your pharmacy and it is available for you for $4.

## 2023-07-15 NOTE — Hospital Course (Addendum)
RED MANDT is a 22 y.o. male with a history of asthma and marijuana use.  Patient presented secondary to dyspnea and was found to have evidence of an asthma exacerbation. Patient was given solumedrol, magnesium and albuterol for initial treatment. He had associated hypoxia requiring supplemental oxygen. RVP positive for rhinovirus/enterovirus. Patient improved with Solu-medrol and albuterol. Patient to continue bronchodilators and steroids on discharge. Follow-up with PCP.

## 2023-07-15 NOTE — Plan of Care (Signed)

## 2023-07-16 ENCOUNTER — Telehealth: Payer: Self-pay

## 2023-07-16 NOTE — Transitions of Care (Post Inpatient/ED Visit) (Signed)
07/16/2023  Name: Hector Gross MRN: [id number] DOB: [birth year]  Today's TOC FU Call Status: Today's TOC FU Call Status:: Unsuccessful Call (1st Attempt) Unsuccessful Call (1st Attempt) Date: 07/16/23  Attempted to reach the patient regarding the most recent Inpatient/ED visit.  Follow Up Plan: Additional outreach attempts will be made to reach the patient to complete the Transitions of Care (Post Inpatient/ED visit) call.   Signature  Robyne Peers, RN

## 2023-07-17 ENCOUNTER — Telehealth: Payer: Self-pay

## 2023-07-17 NOTE — Transitions of Care (Post Inpatient/ED Visit) (Signed)
07/17/2023  Name: Hector Gross MRN: [id number] DOB: [birth year]  Today's TOC FU Call Status: Today's TOC FU Call Status:: Unsuccessful Call (2nd Attempt) Unsuccessful Call (1st Attempt) Date: 07/16/23 Unsuccessful Call (2nd Attempt) Date: 07/17/23  Attempted to reach the patient regarding the most recent Inpatient/ED visit.  Follow Up Plan: Additional outreach attempts will be made to reach the patient to complete the Transitions of Care (Post Inpatient/ED visit) call.   Signature  Robyne Peers, RN

## 2023-07-18 ENCOUNTER — Telehealth: Payer: Self-pay

## 2023-07-18 NOTE — Transitions of Care (Post Inpatient/ED Visit) (Signed)
07/18/2023  Name: Hector Gross MRN: [id number] DOB: 2001/09/04  Today's TOC FU Call Status: Today's TOC FU Call Status:: Unsuccessful Call (3rd Attempt) Unsuccessful Call (1st Attempt) Date: 07/16/23 Unsuccessful Call (2nd Attempt) Date: 07/17/23 Unsuccessful Call (3rd Attempt) Date: 07/18/23  Attempted to reach the patient regarding the most recent Inpatient/ED visit.  Follow Up Plan: No further outreach attempts will be made at this time. We have been unable to contact the patient.  Letter sent to patient requesting he call [facility] to schedule a follow up appointment as we have not been able to reach him   Signature  Robyne Peers, RN

## 2023-09-14 ENCOUNTER — Emergency Department (HOSPITAL_COMMUNITY)
Admission: EM | Admit: 2023-09-14 | Discharge: 2023-09-14 | Disposition: A | Discharge status: Home / Self Care | Payer: Medicaid Other | Attending: Emergency Medicine | Admitting: Emergency Medicine

## 2023-09-14 ENCOUNTER — Encounter (HOSPITAL_COMMUNITY): Payer: Self-pay

## 2023-09-14 ENCOUNTER — Other Ambulatory Visit: Payer: Self-pay

## 2023-09-14 DIAGNOSIS — Z7951 Long term (current) use of inhaled steroids: Secondary | ICD-10-CM | POA: Insufficient documentation

## 2023-09-14 DIAGNOSIS — J45909 Unspecified asthma, uncomplicated: Secondary | ICD-10-CM | POA: Diagnosis not present

## 2023-09-14 DIAGNOSIS — Z760 Encounter for issue of repeat prescription: Secondary | ICD-10-CM | POA: Insufficient documentation

## 2023-09-14 DIAGNOSIS — R062 Wheezing: Secondary | ICD-10-CM | POA: Diagnosis present

## 2023-09-14 MED ORDER — ALBUTEROL SULFATE HFA 108 (90 BASE) MCG/ACT IN AERS
2.0000 | INHALATION_SPRAY | RESPIRATORY_TRACT | Status: DC | PRN
  Administered 2023-09-14: 2 via RESPIRATORY_TRACT
  Filled 2023-09-14: qty 6.7

## 2023-09-14 NOTE — ED Triage Notes (Signed)
PT arrived via POV, states he has Hx of Asthma without current acute exacerbation, but has run out of his inhaler supply. States there is a delay getting his current Rx refilled. Airway patent, PT aox4.

## 2023-09-14 NOTE — ED Provider Notes (Signed)
[place] EMERGENCY DEPARTMENT AT [facility] Provider Note   CSN: [id number] Arrival date & time: 09/14/23  3875     History  Chief Complaint  Patient presents with   Medication Refill    Hector Gross is a 22 y.o. male.  HPI 22 year old male history of asthma presents today out of his albuterol inhaler.  He states he last used it last night.  He normally needs to use it about once a day.  He is having some mild wheezing.  He feels that this is baseline for him.  He states he cannot pick it up until Monday at [facility].  He seen at [facility]	[place] [facility].  He denies any fever, chills shortness of breath, productive cough or other respiratory symptoms.    Home Medications Prior to Admission medications   Medication Sig Start Date End Date Taking? Authorizing Provider  albuterol (VENTOLIN HFA) 108 (90 Base) MCG/ACT inhaler Inhale 2 puffs into the lungs every 4 (four) hours while awake for 3 days, THEN 1-2 puffs every 4 (four) hours as needed for wheezing or shortness of breath. 07/15/23 08/17/23  Narda Bonds, MD  fluticasone-salmeterol (ADVAIR HFA) 643-32 MCG/ACT inhaler Inhale 2 puffs into the lungs 2 (two) times daily. Patient not taking: Reported on 07/14/2023 07/07/23   Elayne Snare K, DO  montelukast (SINGULAIR) 10 MG tablet Take 1 tablet (10 mg total) by mouth at bedtime. Patient not taking: Reported on 07/14/2023 05/19/23   Zadie Rhine, MD  ondansetron [facility]) 4 MG tablet Take 1 tablet (4 mg total) by mouth every 8 (eight) hours as needed for nausea or vomiting. 07/15/23   Narda Bonds, MD  triamcinolone cream (KENALOG) 0.1 % Apply 1 Application topically 2 (two) times daily. Patient taking differently: Apply 1 Application topically daily as needed (for itching). 02/20/23   [name], NP      Allergies    Patient has no known allergies.    Review of Systems   Review of Systems  Physical Exam Updated Vital Signs BP 134/86   Pulse  69   Temp 97.9 F (36.6 C) (Oral)   Resp 15   Ht 1.803 m (5\' 11" )   Wt 93 kg   SpO2 95%   BMI 28.59 kg/m  Physical Exam Vitals and nursing note reviewed.  Constitutional:      Appearance: He is well-developed.  HENT:     Head: Normocephalic and atraumatic.     Right Ear: External ear normal.     Left Ear: External ear normal.     Nose: Nose normal.  Eyes:     Extraocular Movements: Extraocular movements intact.  Neck:     Trachea: No tracheal deviation.  Cardiovascular:     Rate and Rhythm: Normal rate and regular rhythm.  Pulmonary:     Effort: Pulmonary effort is normal.     Breath sounds: Wheezing present.     Comments: Few scattered expiratory wheezes on exam No dyspnea oxygen saturations 95% with normal respiratory rate Abdominal:     Palpations: Abdomen is soft.  Musculoskeletal:        General: Normal range of motion.  Skin:    General: Skin is warm and dry.     Capillary Refill: Capillary refill takes less than 2 seconds.  Neurological:     Mental Status: He is alert and oriented to person, place, and time.  Psychiatric:        Mood and Affect: Mood normal.  Behavior: Behavior normal.     ED Results / Procedures / Treatments   Labs (all labs ordered are listed, but only abnormal results are displayed) Labs Reviewed - No data to display  EKG None  Radiology No results found.  Procedures Procedures    Medications Ordered in ED Medications  albuterol (VENTOLIN HFA) 108 (90 Base) MCG/ACT inhaler 2 puff (has no administration in time range)    ED Course/ Medical Decision Making/ A&P                                 Medical Decision Making Risk Prescription drug management.   22 year old male known history of asthma presents today out of his medications. He appears to be at baseline from his asthma status with some mild expiratory wheezing.  He normally uses his albuterol once a day and has not used it since last night.  He feels that  this is baseline for him.  He is given an albuterol HFA here in the department. He appears stable for discharge.  We have discussed return precautions need for follow-up and he voices understanding        Final Clinical Impression(s) / ED Diagnoses Final diagnoses:  Medication refill  Mild asthma, unspecified whether complicated, unspecified whether persistent    Rx / DC Orders ED Discharge Orders     None         Margarita Grizzle, MD 09/14/23 [phone]

## 2023-09-21 ENCOUNTER — Emergency Department (HOSPITAL_COMMUNITY)
Admission: EM | Admit: 2023-09-21 | Discharge: 2023-09-21 | Disposition: A | Discharge status: Home / Self Care | Payer: Medicaid Other | Attending: Emergency Medicine | Admitting: Emergency Medicine

## 2023-09-21 ENCOUNTER — Encounter: Payer: Self-pay | Admitting: Nurse Practitioner

## 2023-09-21 ENCOUNTER — Other Ambulatory Visit: Payer: Self-pay

## 2023-09-21 ENCOUNTER — Emergency Department (HOSPITAL_COMMUNITY): Payer: Medicaid Other

## 2023-09-21 DIAGNOSIS — R0602 Shortness of breath: Secondary | ICD-10-CM | POA: Diagnosis not present

## 2023-09-21 DIAGNOSIS — F1210 Cannabis abuse, uncomplicated: Secondary | ICD-10-CM | POA: Diagnosis not present

## 2023-09-21 DIAGNOSIS — D72829 Elevated white blood cell count, unspecified: Secondary | ICD-10-CM | POA: Diagnosis not present

## 2023-09-21 DIAGNOSIS — J4541 Moderate persistent asthma with (acute) exacerbation: Secondary | ICD-10-CM | POA: Diagnosis not present

## 2023-09-21 DIAGNOSIS — R062 Wheezing: Secondary | ICD-10-CM | POA: Diagnosis not present

## 2023-09-21 LAB — CBC WITH DIFFERENTIAL/PLATELET
Abs Immature Granulocytes: 0.03 10*3/uL (ref 0.00–0.07)
Basophils Absolute: 0.1 10*3/uL (ref 0.0–0.1)
Basophils Relative: 1 %
Eosinophils Absolute: 0.5 10*3/uL (ref 0.0–0.5)
Eosinophils Relative: 5 %
HCT: 42.4 % (ref 39.0–52.0)
Hemoglobin: 14.3 g/dL (ref 13.0–17.0)
Immature Granulocytes: 0 %
Lymphocytes Relative: 20 %
Lymphs Abs: 2.2 10*3/uL (ref 0.7–4.0)
MCH: 27.9 pg (ref 26.0–34.0)
MCHC: 33.7 g/dL (ref 30.0–36.0)
MCV: 82.8 fL (ref 80.0–100.0)
Monocytes Absolute: 1.2 10*3/uL — ABNORMAL HIGH (ref 0.1–1.0)
Monocytes Relative: 11 %
Neutro Abs: 6.9 10*3/uL (ref 1.7–7.7)
Neutrophils Relative %: 63 %
Platelets: 425 10*3/uL — ABNORMAL HIGH (ref 150–400)
RBC: 5.12 MIL/uL (ref 4.22–5.81)
RDW: 13.5 % (ref 11.5–15.5)
WBC: 10.9 10*3/uL — ABNORMAL HIGH (ref 4.0–10.5)
nRBC: 0 % (ref 0.0–0.2)

## 2023-09-21 LAB — COMPREHENSIVE METABOLIC PANEL
ALT: 39 U/L (ref 0–44)
AST: 45 U/L — ABNORMAL HIGH (ref 15–41)
Albumin: 3.8 g/dL (ref 3.5–5.0)
Alkaline Phosphatase: 64 U/L (ref 38–126)
Anion gap: 14 (ref 5–15)
BUN: 17 mg/dL (ref 6–20)
CO2: 23 mmol/L (ref 22–32)
Calcium: 10 mg/dL (ref 8.9–10.3)
Chloride: 103 mmol/L (ref 98–111)
Creatinine, Ser: 1.21 mg/dL (ref 0.61–1.24)
GFR, Estimated: >60 mL/min (ref >60)
Glucose, Bld: 96 mg/dL (ref 70–99)
Potassium: 3.6 mmol/L (ref 3.5–5.1)
Sodium: 140 mmol/L (ref 135–145)
Total Bilirubin: 0.7 mg/dL (ref <1.2)
Total Protein: 7.1 g/dL (ref 6.5–8.1)

## 2023-09-21 MED ORDER — PREDNISONE 20 MG PO TABS
40.0000 mg | ORAL_TABLET | Freq: Once | ORAL | Status: AC
  Administered 2023-09-21: 40 mg via ORAL
  Filled 2023-09-21: qty 2

## 2023-09-21 MED ORDER — ALBUTEROL SULFATE HFA 108 (90 BASE) MCG/ACT IN AERS
6.0000 | INHALATION_SPRAY | Freq: Once | RESPIRATORY_TRACT | Status: AC
  Administered 2023-09-21: 6 via RESPIRATORY_TRACT
  Filled 2023-09-21: qty 6.7

## 2023-09-21 MED ORDER — AEROCHAMBER PLUS FLO-VU LARGE MISC
1.0000 | Freq: Once | Status: AC
  Administered 2023-09-21: 1

## 2023-09-21 MED ORDER — ALBUTEROL SULFATE (2.5 MG/3ML) 0.083% IN NEBU
2.5000 mg | INHALATION_SOLUTION | Freq: Once | RESPIRATORY_TRACT | Status: AC
  Administered 2023-09-21: 2.5 mg via RESPIRATORY_TRACT
  Filled 2023-09-21: qty 3

## 2023-09-21 MED ORDER — PREDNISONE 20 MG PO TABS: 40.0000 mg | ORAL_TABLET | Freq: Every day | ORAL | 0 refills | Status: AC

## 2023-09-21 NOTE — ED Provider Notes (Signed)
[place] EMERGENCY DEPARTMENT AT [facility] Provider Note  CSN: [id number] Arrival date & time: 09/21/23 0016  Chief Complaint(s) Asthma  HPI Hector Gross is a 22 y.o. male with history of asthma here for shortness of breath consistent with asthma exacerbation.  He reports running out of inhaler given to him a week ago.  Patient denies any fevers or chills.  Admits to continued smoking.  Denies any chest pain.  The history is provided by the patient.    Past Medical History Past Medical History:  Diagnosis Date   Asthma    Patient Active Problem List   Diagnosis Date Noted   Hypokalemia 07/15/2023   Acute respiratory failure with hypoxia (HCC) 07/15/2023   Upper respiratory infection 07/15/2023   Moderate persistent asthma 07/15/2023   Asthma exacerbation 07/14/2023   Marijuana abuse 01/28/2023   Acute asthma exacerbation 06/24/2021   Uncomplicated asthma    Asthma, chronic, unspecified asthma severity, with acute exacerbation 04/23/2021   Chronic asthma with acute exacerbation 04/23/2021   Home Medication(s) Prior to Admission medications   Medication Sig Start Date End Date Taking? Authorizing Provider  predniSONE (DELTASONE) 20 MG tablet Take 2 tablets (40 mg total) by mouth daily with breakfast for 4 days. 09/21/23 09/25/23 Yes Cavon Nicolls, Amadeo Garnet, MD  albuterol (VENTOLIN HFA) 108 (90 Base) MCG/ACT inhaler Inhale 2 puffs into the lungs every 4 (four) hours while awake for 3 days, THEN 1-2 puffs every 4 (four) hours as needed for wheezing or shortness of breath. 07/15/23 08/17/23  Narda Bonds, MD  fluticasone-salmeterol (ADVAIR HFA) 295-28 MCG/ACT inhaler Inhale 2 puffs into the lungs 2 (two) times daily. Patient not taking: Reported on 07/14/2023 07/07/23   Elayne Snare K, DO  montelukast (SINGULAIR) 10 MG tablet Take 1 tablet (10 mg total) by mouth at bedtime. Patient not taking: Reported on 07/14/2023 05/19/23   Zadie Rhine, MD  ondansetron  [facility]) 4 MG tablet Take 1 tablet (4 mg total) by mouth every 8 (eight) hours as needed for nausea or vomiting. 07/15/23   Narda Bonds, MD  triamcinolone cream (KENALOG) 0.1 % Apply 1 Application topically 2 (two) times daily. Patient taking differently: Apply 1 Application topically daily as needed (for itching). 02/20/23   [name], NP                                                                                                                                    Allergies Patient has no known allergies.  Review of Systems Review of Systems As noted in HPI  Physical Exam Vital Signs  I have reviewed the triage vital signs BP (!) 136/91 (BP Location: Right Arm)   Pulse 82   Temp 98.5 F (36.9 C)   Resp 18   SpO2 94%   Physical Exam Vitals reviewed.  Constitutional:      General: He is not in acute distress.  Appearance: He is well-developed. He is not diaphoretic.  HENT:     Head: Normocephalic and atraumatic.     Nose: Nose normal.  Eyes:     General: No scleral icterus.       Right eye: No discharge.        Left eye: No discharge.     Conjunctiva/sclera: Conjunctivae normal.     Pupils: Pupils are equal, round, and reactive to light.  Cardiovascular:     Rate and Rhythm: Normal rate and regular rhythm.     Heart sounds: No murmur heard.    No friction rub. No gallop.  Pulmonary:     Effort: Pulmonary effort is normal. No respiratory distress.     Breath sounds: No stridor. Wheezing and rhonchi present. No rales.  Abdominal:     General: There is no distension.     Palpations: Abdomen is soft.     Tenderness: There is no abdominal tenderness.  Musculoskeletal:        General: No tenderness.     Cervical back: Normal range of motion and neck supple.  Skin:    General: Skin is warm and dry.     Findings: No erythema or rash.  Neurological:     Mental Status: He is alert and oriented to person, place, and time.     ED Results and  Treatments Labs (all labs ordered are listed, but only abnormal results are displayed) Labs Reviewed  CBC WITH DIFFERENTIAL/PLATELET - Abnormal; Notable for the following components:      Result Value   WBC 10.9 (*)    Platelets 425 (*)    Monocytes Absolute 1.2 (*)    All other components within normal limits  COMPREHENSIVE METABOLIC PANEL - Abnormal; Notable for the following components:   AST 45 (*)    All other components within normal limits                                                                                                                         EKG  EKG Interpretation Date/Time:    Ventricular Rate:    PR Interval:    QRS Duration:    QT Interval:    QTC Calculation:   R Axis:      Text Interpretation:         Radiology DG Chest 2 View  Result Date: 09/21/2023 CLINICAL DATA:  Shortness of breath, wheezing EXAM: CHEST - 2 VIEW COMPARISON:  07/14/2023 FINDINGS: Lungs are clear.  No pleural effusion or pneumothorax. The heart is normal in size. Visualized osseous structures are within normal limits. IMPRESSION: Normal chest radiographs. Electronically Signed   By: Charline Bills M.D.   On: 09/21/2023 00:53    Medications Ordered in ED Medications  albuterol (PROVENTIL) (2.5 MG/3ML) 0.083% nebulizer solution 2.5 mg (2.5 mg Nebulization Given 09/21/23 0033)  predniSONE (DELTASONE) tablet 40 mg (40 mg Oral Given 09/21/23 0448)  albuterol (VENTOLIN HFA) 108 (90 Base) MCG/ACT inhaler 6  puff (6 puffs Inhalation Given 09/21/23 0449)  AeroChamber Plus Flo-Vu Large MISC 1 each (1 each Other Given 09/21/23 0450)   Procedures Procedures  (including critical care time) Medical Decision Making / ED Course   Medical Decision Making Amount and/or Complexity of Data Reviewed Labs: ordered. Decision-making details documented in ED Course. Radiology: ordered and independent interpretation performed. Decision-making details documented in ED Course.  Risk Prescription  drug management.    Shortness of breath consistent with asthma exacerbation.  CBC with mild leukocytosis without anemia.  No significant electrolyte derangements or renal sufficiency.  Chest x-ray without evidence of pneumonia, pneumothorax, pulmonary edema or pleural effusions.  Patient provided with 1 DuoNeb and 6 puffs of albuterol.  Patient's wheezing resolved. Will rx steroids.    Final Clinical Impression(s) / ED Diagnoses Final diagnoses:  Moderate persistent asthma with exacerbation   The patient appears reasonably screened and/or stabilized for discharge and I doubt any other medical condition or other [facility] requiring further screening, evaluation, or treatment in the ED at this time. I have discussed the findings, Dx and Tx plan with the patient/family who expressed understanding and agree(s) with the plan. Discharge instructions discussed at length. The patient/family was given strict return precautions who verbalized understanding of the instructions. No further questions at time of discharge.  Disposition: Discharge  Condition: Good  ED Discharge Orders          Ordered    predniSONE (DELTASONE) 20 MG tablet  Daily with breakfast        09/21/23 9629             Follow Up: [name], NP [address] [place] [address] [place] [address] [phone]  Call  to schedule an appointment for close follow up    This chart was dictated using voice recognition software.  Despite best efforts to proofread,  errors can occur which can change the documentation meaning.    Nira Conn, MD 09/21/23 [phone]

## 2023-09-21 NOTE — ED Notes (Signed)
Went to give patient discharge papers and he had walked out.

## 2023-09-21 NOTE — ED Triage Notes (Signed)
Patient reports asthma attack this evening with productive cough and wheezing , he ran out of his rescue inhaler .

## 2023-09-21 NOTE — Discharge Instructions (Addendum)
Albuterol inhaler: 2-4 puffs every 4-6 hours if needed for wheezing or shortness of breath.  

## 2023-09-23 ENCOUNTER — Other Ambulatory Visit: Payer: Self-pay | Admitting: Nurse Practitioner

## 2023-09-23 DIAGNOSIS — J4541 Moderate persistent asthma with (acute) exacerbation: Secondary | ICD-10-CM

## 2023-09-23 MED ORDER — FLUTICASONE-SALMETEROL 115-21 MCG/ACT IN AERO: 2.0000 | INHALATION_SPRAY | Freq: Two times a day (BID) | RESPIRATORY_TRACT | 1 refills | Status: DC

## 2023-09-23 MED ORDER — MOMETASONE FURO-FORMOTEROL FUM 200-5 MCG/ACT IN AERO: 2.0000 | INHALATION_SPRAY | Freq: Two times a day (BID) | RESPIRATORY_TRACT | 1 refills | Status: DC

## 2023-09-24 ENCOUNTER — Other Ambulatory Visit: Payer: Self-pay | Admitting: Nurse Practitioner

## 2023-09-24 MED ORDER — ALBUTEROL SULFATE HFA 108 (90 BASE) MCG/ACT IN AERS: 1.0000 | INHALATION_SPRAY | RESPIRATORY_TRACT | 0 refills | Status: DC

## 2023-09-24 MED ORDER — MONTELUKAST SODIUM 10 MG PO TABS: 10.0000 mg | ORAL_TABLET | Freq: Every day | ORAL | 0 refills | Status: AC

## 2023-10-20 ENCOUNTER — Other Ambulatory Visit: Payer: Self-pay

## 2023-10-20 ENCOUNTER — Emergency Department (HOSPITAL_COMMUNITY)
Admission: EM | Admit: 2023-10-20 | Discharge: 2023-10-20 | Discharge status: Against Medical Advice | Payer: Medicaid Other | Attending: Emergency Medicine | Admitting: Emergency Medicine

## 2023-10-20 DIAGNOSIS — J45909 Unspecified asthma, uncomplicated: Secondary | ICD-10-CM | POA: Insufficient documentation

## 2023-10-20 DIAGNOSIS — Z5321 Procedure and treatment not carried out due to patient leaving prior to being seen by health care provider: Secondary | ICD-10-CM | POA: Insufficient documentation

## 2023-10-20 DIAGNOSIS — Z760 Encounter for issue of repeat prescription: Secondary | ICD-10-CM | POA: Diagnosis not present

## 2023-10-20 DIAGNOSIS — R0602 Shortness of breath: Secondary | ICD-10-CM | POA: Diagnosis present

## 2023-10-20 NOTE — ED Triage Notes (Signed)
 Pt c/o asthma and feeling mild SOB. Pt requesting refill on his prescription inhaler.

## 2023-11-04 ENCOUNTER — Other Ambulatory Visit: Payer: Self-pay | Admitting: Nurse Practitioner

## 2024-01-11 ENCOUNTER — Encounter (HOSPITAL_COMMUNITY): Payer: Self-pay | Admitting: *Deleted

## 2024-01-11 ENCOUNTER — Other Ambulatory Visit: Payer: Self-pay

## 2024-01-11 ENCOUNTER — Other Ambulatory Visit: Payer: Self-pay | Admitting: Nurse Practitioner

## 2024-01-11 ENCOUNTER — Emergency Department (HOSPITAL_COMMUNITY)
Admission: EM | Admit: 2024-01-11 | Discharge: 2024-01-11 | Discharge status: Against Medical Advice | Attending: Emergency Medicine | Admitting: Emergency Medicine

## 2024-01-11 ENCOUNTER — Emergency Department (HOSPITAL_COMMUNITY)

## 2024-01-11 DIAGNOSIS — J45909 Unspecified asthma, uncomplicated: Secondary | ICD-10-CM | POA: Diagnosis not present

## 2024-01-11 DIAGNOSIS — Z5321 Procedure and treatment not carried out due to patient leaving prior to being seen by health care provider: Secondary | ICD-10-CM | POA: Insufficient documentation

## 2024-01-11 LAB — RESP PANEL BY RT-PCR (RSV, FLU A&B, COVID)  RVPGX2
Influenza A by PCR: NEGATIVE
Influenza B by PCR: NEGATIVE
Resp Syncytial Virus by PCR: NEGATIVE
SARS Coronavirus 2 by RT PCR: NEGATIVE

## 2024-01-11 MED ORDER — ALBUTEROL SULFATE HFA 108 (90 BASE) MCG/ACT IN AERS
2.0000 | INHALATION_SPRAY | Freq: Once | RESPIRATORY_TRACT | Status: AC
  Administered 2024-01-11: 2 via RESPIRATORY_TRACT
  Filled 2024-01-11: qty 6.7

## 2024-01-11 NOTE — ED Notes (Signed)
 Pt called [facility] to go to a room. Pt could not be found.

## 2024-01-11 NOTE — ED Triage Notes (Signed)
 The pt has a cold and he is an asthmatic  he ran out of his inhaler this am no audible wheezes

## 2024-01-13 ENCOUNTER — Emergency Department (HOSPITAL_COMMUNITY)
Admission: EM | Admit: 2024-01-13 | Discharge: 2024-01-14 | Disposition: A | Discharge status: Home / Self Care | Attending: Emergency Medicine | Admitting: Emergency Medicine

## 2024-01-13 ENCOUNTER — Encounter (HOSPITAL_COMMUNITY): Payer: Self-pay

## 2024-01-13 ENCOUNTER — Other Ambulatory Visit: Payer: Self-pay

## 2024-01-13 DIAGNOSIS — J45901 Unspecified asthma with (acute) exacerbation: Secondary | ICD-10-CM | POA: Diagnosis not present

## 2024-01-13 DIAGNOSIS — J45909 Unspecified asthma, uncomplicated: Secondary | ICD-10-CM | POA: Diagnosis present

## 2024-01-13 DIAGNOSIS — R062 Wheezing: Secondary | ICD-10-CM | POA: Diagnosis not present

## 2024-01-13 DIAGNOSIS — R918 Other nonspecific abnormal finding of lung field: Secondary | ICD-10-CM | POA: Diagnosis not present

## 2024-01-13 DIAGNOSIS — R0602 Shortness of breath: Secondary | ICD-10-CM | POA: Diagnosis not present

## 2024-01-13 MED ORDER — ALBUTEROL SULFATE (2.5 MG/3ML) 0.083% IN NEBU
2.5000 mg | INHALATION_SOLUTION | Freq: Once | RESPIRATORY_TRACT | Status: AC
  Administered 2024-01-13: 2.5 mg via RESPIRATORY_TRACT
  Filled 2024-01-13: qty 3

## 2024-01-13 NOTE — ED Triage Notes (Addendum)
 Pt presents with ShOB and wheezing that started yesterday. Pt has a hx of asthma and is maintained on Dulera BID and albuterol PRN. He states he can not count how many times he has used his albuterol MDI in the last 24 hours. He is in the tripod position, but is able to speak in complete sentences between breaths.

## 2024-01-14 ENCOUNTER — Emergency Department (HOSPITAL_COMMUNITY)

## 2024-01-14 DIAGNOSIS — R062 Wheezing: Secondary | ICD-10-CM | POA: Diagnosis not present

## 2024-01-14 DIAGNOSIS — R0602 Shortness of breath: Secondary | ICD-10-CM | POA: Diagnosis not present

## 2024-01-14 DIAGNOSIS — R918 Other nonspecific abnormal finding of lung field: Secondary | ICD-10-CM | POA: Diagnosis not present

## 2024-01-14 LAB — CBC WITH DIFFERENTIAL/PLATELET
Abs Immature Granulocytes: 0.02 10*3/uL (ref 0.00–0.07)
Basophils Absolute: 0.1 10*3/uL (ref 0.0–0.1)
Basophils Relative: 1 %
Eosinophils Absolute: 0.7 10*3/uL — ABNORMAL HIGH (ref 0.0–0.5)
Eosinophils Relative: 7 %
HCT: 44 % (ref 39.0–52.0)
Hemoglobin: 15 g/dL (ref 13.0–17.0)
Immature Granulocytes: 0 %
Lymphocytes Relative: 13 %
Lymphs Abs: 1.2 10*3/uL (ref 0.7–4.0)
MCH: 28.6 pg (ref 26.0–34.0)
MCHC: 34.1 g/dL (ref 30.0–36.0)
MCV: 84 fL (ref 80.0–100.0)
Monocytes Absolute: 0.6 10*3/uL (ref 0.1–1.0)
Monocytes Relative: 7 %
Neutro Abs: 6.6 10*3/uL (ref 1.7–7.7)
Neutrophils Relative %: 72 %
Platelets: 386 10*3/uL (ref 150–400)
RBC: 5.24 MIL/uL (ref 4.22–5.81)
RDW: 13.3 % (ref 11.5–15.5)
WBC: 9.1 10*3/uL (ref 4.0–10.5)
nRBC: 0 % (ref 0.0–0.2)

## 2024-01-14 LAB — BASIC METABOLIC PANEL WITH GFR
Anion gap: 11 (ref 5–15)
BUN: 11 mg/dL (ref 6–20)
CO2: 24 mmol/L (ref 22–32)
Calcium: 9.8 mg/dL (ref 8.9–10.3)
Chloride: 104 mmol/L (ref 98–111)
Creatinine, Ser: 1.12 mg/dL (ref 0.61–1.24)
GFR, Estimated: >60 mL/min (ref >60)
Glucose, Bld: 96 mg/dL (ref 70–99)
Potassium: 3.3 mmol/L — ABNORMAL LOW (ref 3.5–5.1)
Sodium: 139 mmol/L (ref 135–145)

## 2024-01-14 MED ORDER — IPRATROPIUM-ALBUTEROL 0.5-2.5 (3) MG/3ML IN SOLN
3.0000 mL | Freq: Once | RESPIRATORY_TRACT | Status: AC
Start: 2024-01-14 — End: 2024-01-14
  Administered 2024-01-14: 3 mL via RESPIRATORY_TRACT
  Filled 2024-01-14: qty 3

## 2024-01-14 MED ORDER — MAGNESIUM SULFATE 2 GM/50ML IV SOLN
2.0000 g | Freq: Once | INTRAVENOUS | Status: AC
  Administered 2024-01-14: 2 g via INTRAVENOUS
  Filled 2024-01-14: qty 50

## 2024-01-14 MED ORDER — DULERA 200-5 MCG/ACT IN AERO: 2.0000 | INHALATION_SPRAY | Freq: Two times a day (BID) | RESPIRATORY_TRACT | 1 refills | Status: DC

## 2024-01-14 MED ORDER — DEXAMETHASONE SODIUM PHOSPHATE 10 MG/ML IJ SOLN
10.0000 mg | Freq: Once | INTRAMUSCULAR | Status: AC
  Administered 2024-01-14: 10 mg via INTRAVENOUS
  Filled 2024-01-14: qty 1

## 2024-01-14 MED ORDER — ONDANSETRON HCL 4 MG/2ML IJ SOLN
4.0000 mg | Freq: Once | INTRAMUSCULAR | Status: AC
  Administered 2024-01-14: 4 mg via INTRAVENOUS
  Filled 2024-01-14: qty 2

## 2024-01-14 MED ORDER — ALBUTEROL SULFATE (2.5 MG/3ML) 0.083% IN NEBU
10.0000 mg/h | INHALATION_SOLUTION | RESPIRATORY_TRACT | Status: DC
  Administered 2024-01-14: 10 mg/h via RESPIRATORY_TRACT
  Filled 2024-01-14: qty 12

## 2024-01-14 MED ORDER — ALBUTEROL SULFATE (2.5 MG/3ML) 0.083% IN NEBU
7.5000 mg/h | INHALATION_SOLUTION | Freq: Once | RESPIRATORY_TRACT | Status: DC
  Filled 2024-01-14: qty 3

## 2024-01-14 NOTE — ED Notes (Signed)
 Patient verbalizes understanding of discharge instructions. Opportunity for questioning and answers were provided. Armband removed by staff, pt discharged from ED. Pt taken to ED waiting room via wheel chair.

## 2024-01-14 NOTE — Discharge Instructions (Addendum)
 Your workup tonight was consistent with an asthma exacerbation.  Please continue to take your prescribed albuterol.  I did send a refill of your Elwin Sleight to the pharmacy.  Follow-up with your primary care provider for further evaluation.  It also appears that a referral was sent to pulmonology.  They report trying to contact you.  Please follow-up for further management of your asthma.  If you become short of breath or develop other life-threatening symptoms please return to the emergency department.

## 2024-01-14 NOTE — ED Provider Notes (Signed)
 [place] EMERGENCY DEPARTMENT AT [facility] Provider Note   CSN: [id number] Arrival date & time: 01/13/24  2158     History  Chief Complaint  Patient presents with   Asthma    Hector Gross is a 23 y.o. male.  Patient presents the emergency department complaining of shortness of breath which began yesterday.  He has a history of chronic asthma, marijuana abuse, hypokalemia.  He states he used his albuterol inhaler multiple times throughout the day without relief.  He is also prescribed Dulera twice daily but has been out of that prescription for a week or more.  He denies chest pain, abdominal pain, nausea, vomiting, fever.   Asthma       Home Medications Prior to Admission medications   Medication Sig Start Date End Date Taking? Authorizing Provider  albuterol (VENTOLIN HFA) 108 (90 Base) MCG/ACT inhaler Inhale 1-2 puffs into the lungs every 4 (four) hours while awake. 09/24/23   [name], NP  mometasone-formoterol (DULERA) 200-5 MCG/ACT AERO Inhale 2 puffs into the lungs 2 (two) times daily. 01/14/24   Darrick Grinder, PA-C  montelukast (SINGULAIR) 10 MG tablet Take 1 tablet (10 mg total) by mouth at bedtime. 09/24/23   [name], NP  ondansetron (ZOFRAN) 4 MG tablet Take 1 tablet (4 mg total) by mouth every 8 (eight) hours as needed for nausea or vomiting. 07/15/23   Narda Bonds, MD  triamcinolone cream (KENALOG) 0.1 % Apply 1 Application topically 2 (two) times daily. Patient taking differently: Apply 1 Application topically daily as needed (for itching). 02/20/23   [name], NP      Allergies    Patient has no known allergies.    Review of Systems   Review of Systems  Physical Exam Updated Vital Signs BP (!) 142/44 (BP Location: Right Arm)   Pulse 98   Temp (!) 97.5 F (36.4 C) (Oral)   Resp 20   Ht 6' (1.829 m)   Wt 97.5 kg   SpO2 96%   BMI 29.16 kg/m  Physical Exam Vitals and nursing note reviewed.  Constitutional:       General: He is not in acute distress.    Appearance: He is well-developed.  HENT:     Head: Normocephalic and atraumatic.  Eyes:     Conjunctiva/sclera: Conjunctivae normal.  Cardiovascular:     Rate and Rhythm: Normal rate and regular rhythm.     Heart sounds: No murmur heard. Pulmonary:     Effort: Pulmonary effort is normal. No respiratory distress.     Breath sounds: Wheezing present.  Abdominal:     Palpations: Abdomen is soft.     Tenderness: There is no abdominal tenderness.  Musculoskeletal:     Cervical back: Neck supple.  Skin:    General: Skin is warm and dry.     Capillary Refill: Capillary refill takes less than 2 seconds.  Neurological:     Mental Status: He is alert.  Psychiatric:        Mood and Affect: Mood normal.     ED Results / Procedures / Treatments   Labs (all labs ordered are listed, but only abnormal results are displayed) Labs Reviewed  BASIC METABOLIC PANEL WITH GFR - Abnormal; Notable for the following components:      Result Value   Potassium 3.3 (*)    All other components within normal limits  CBC WITH DIFFERENTIAL/PLATELET - Abnormal; Notable for the following components:  Eosinophils Absolute 0.7 (*)    All other components within normal limits    EKG None  Radiology DG Chest 2 View Result Date: 01/14/2024 CLINICAL DATA:  Shortness of breath and wheezing onset yesterday. EXAM: CHEST - 2 VIEW COMPARISON:  PA and lateral chest 01/11/2024 FINDINGS: The heart size and mediastinal contours are within normal limits. Both lungs are clear hyperinflated but clear aside from mild central bronchial thickening. The visualized skeletal structures are unremarkable. IMPRESSION: Hyperinflation and mild central bronchial thickening consistent with bronchitis or reactive airways disease. No focal airspace opacity. Electronically Signed   By: Almira Bar M.D.   On: 01/14/2024 01:35    Procedures Procedures    Medications Ordered in  ED Medications  albuterol (PROVENTIL) (2.5 MG/3ML) 0.083% nebulizer solution (0 mg/hr Nebulization Stopped 01/14/24 0229)  albuterol (PROVENTIL) (2.5 MG/3ML) 0.083% nebulizer solution 2.5 mg (2.5 mg Nebulization Given 01/13/24 2235)  albuterol (PROVENTIL) (2.5 MG/3ML) 0.083% nebulizer solution 2.5 mg (2.5 mg Nebulization Given 01/13/24 2313)  ipratropium-albuterol (DUONEB) 0.5-2.5 (3) MG/3ML nebulizer solution 3 mL (3 mLs Nebulization Given 01/14/24 0016)  magnesium sulfate IVPB 2 g 50 mL (0 g Intravenous Stopped 01/14/24 0229)  dexamethasone (DECADRON) injection 10 mg (10 mg Intravenous Given 01/14/24 0123)  ondansetron (ZOFRAN) injection 4 mg (4 mg Intravenous Given 01/14/24 0131)    ED Course/ Medical Decision Making/ A&P                                 Medical Decision Making Amount and/or Complexity of Data Reviewed Labs: ordered. Radiology: ordered.  Risk Prescription drug management.   This patient presents to the ED for concern of shortness of breath, this involves an extensive number of treatment options, and is a complaint that carries with it a high risk of complications and morbidity.  The differential diagnosis includes asthma exacerbation, pneumonia, respiratory infection, others   Co morbidities that complicate the patient evaluation  Asthma   Additional history obtained:  Additional history obtained from visitor at bedside External records from outside source obtained and reviewed including internal medicine notes   Lab Tests:  I Ordered, and personally interpreted labs.  The pertinent results include: Grossly unremarkable CBC, BMP   Imaging Studies ordered:  I ordered imaging studies including chest x-ray I independently visualized and interpreted imaging which showed  Hyperinflation and mild central bronchial thickening consistent with  bronchitis or reactive airways disease. No focal airspace opacity.   I agree with the radiologist interpretation   Cardiac  Monitoring: / EKG:  The patient was maintained on a cardiac monitor.  I personally viewed and interpreted the cardiac monitored which showed an underlying rhythm of: Sinus rhythm with sinus arrhythmia   Problem List / ED Course / Critical interventions / Medication management   I ordered medication including albuterol, DuoNeb, magnesium, Decadron for wheezing Reevaluation of the patient after these medicines showed that the patient improved I have reviewed the patients home medicines and have made adjustments as needed   Social Determinants of Health:  Patient has Medicaid for his primary health insurance type   Test / Admission - Considered:  Patient presentation consistent with an asthma exacerbation.  Wheezing has improved with medications.  His SpO2 on room air has been 95+ percent throughout encounter.  At this time I see no indication for further emergent workup or admission.  Plan to discharge patient with recommendations for continued albuterol therapy as prescribed.  Refill  of Dulera also provided.  Patient to follow-up with primary care team for further evaluation.  It also appears that pulmonology has rechecked the patient about a referral and I recommend the patient follow-up on this for further management.  Return precautions provided.         Final Clinical Impression(s) / ED Diagnoses Final diagnoses:  Exacerbation of asthma, unspecified asthma severity, unspecified whether persistent    Rx / DC Orders ED Discharge Orders          Ordered    mometasone-formoterol (DULERA) 200-5 MCG/ACT AERO  2 times daily        01/14/24 0221              Darrick Grinder, PA-C 01/14/24 0244    Gilda Crease, MD 01/15/24 [phone]

## 2024-02-04 ENCOUNTER — Emergency Department (HOSPITAL_COMMUNITY)

## 2024-02-04 ENCOUNTER — Emergency Department (HOSPITAL_COMMUNITY)
Admission: EM | Admit: 2024-02-04 | Discharge: 2024-02-05 | Discharge status: Against Medical Advice | Attending: Emergency Medicine | Admitting: Emergency Medicine

## 2024-02-04 ENCOUNTER — Other Ambulatory Visit: Payer: Self-pay

## 2024-02-04 DIAGNOSIS — J45909 Unspecified asthma, uncomplicated: Secondary | ICD-10-CM | POA: Insufficient documentation

## 2024-02-04 DIAGNOSIS — R0789 Other chest pain: Secondary | ICD-10-CM | POA: Diagnosis not present

## 2024-02-04 DIAGNOSIS — R0602 Shortness of breath: Secondary | ICD-10-CM | POA: Insufficient documentation

## 2024-02-04 DIAGNOSIS — Z5321 Procedure and treatment not carried out due to patient leaving prior to being seen by health care provider: Secondary | ICD-10-CM | POA: Insufficient documentation

## 2024-02-04 DIAGNOSIS — R059 Cough, unspecified: Secondary | ICD-10-CM | POA: Diagnosis not present

## 2024-02-04 MED ORDER — DEXAMETHASONE SODIUM PHOSPHATE 10 MG/ML IJ SOLN
10.0000 mg | Freq: Once | INTRAMUSCULAR | Status: AC
  Administered 2024-02-04: 10 mg via INTRAMUSCULAR
  Filled 2024-02-04: qty 1

## 2024-02-04 MED ORDER — IPRATROPIUM-ALBUTEROL 0.5-2.5 (3) MG/3ML IN SOLN
3.0000 mL | Freq: Once | RESPIRATORY_TRACT | Status: AC
  Administered 2024-02-04: 3 mL via RESPIRATORY_TRACT
  Filled 2024-02-04: qty 3

## 2024-02-04 MED ORDER — IPRATROPIUM-ALBUTEROL 0.5-2.5 (3) MG/3ML IN SOLN
3.0000 mL | Freq: Once | RESPIRATORY_TRACT | Status: AC
Start: 2024-02-04 — End: 2024-02-04
  Administered 2024-02-04: 3 mL via RESPIRATORY_TRACT
  Filled 2024-02-04: qty 3

## 2024-02-04 NOTE — ED Triage Notes (Signed)
 Pt reports re-occurrence of SOB. Hx of asthma. Reports his home albuterol  inhaler not been working today. Reports a dry cough. No fevers/chills.

## 2024-02-04 NOTE — ED Provider Triage Note (Signed)
 Emergency Medicine Provider Triage Evaluation Note  Hector Gross , a 23 y.o. male  was evaluated in triage.  Pt complains of tightness in his chest that he thinks is his asthma.  Reports taking his albuterol  without relief of symptoms.  Also reports dry cough, no fever or chills.  Review of Systems  Positive: As above Negative: As above  Physical Exam  BP (!) 144/102 (BP Location: Right Arm)   Pulse 75   Temp 97.8 F (36.6 C)   Resp 18   Ht 6' (1.829 m)   Wt 97.5 kg   SpO2 95%   BMI 29.16 kg/m  Gen:   Awake, no distress   Resp:  Normal effort.  Talking in full sentences on room air without difficulty.  Mild expiratory wheezing noted in the lung fields bilaterally MSK:   Moves extremities without difficulty    Medical Decision Making  Medically screening exam initiated at 9:06 PM.  Appropriate orders placed.  KAGE WILLMANN was informed that the remainder of the evaluation will be completed by another provider, this initial triage assessment does not replace that evaluation, and the importance of remaining in the ED until their evaluation is complete.  Will give DuoNeb here in triage.   Rexie Catena, PA-C 02/04/24 2108

## 2024-02-05 NOTE — ED Notes (Addendum)
 Called multiple times for vitals, no response.

## 2024-02-26 ENCOUNTER — Other Ambulatory Visit: Payer: Self-pay

## 2024-02-26 ENCOUNTER — Emergency Department (HOSPITAL_COMMUNITY)
Admission: EM | Admit: 2024-02-26 | Discharge: 2024-02-26 | Disposition: A | Discharge status: Home / Self Care | Attending: Emergency Medicine | Admitting: Emergency Medicine

## 2024-02-26 ENCOUNTER — Emergency Department (HOSPITAL_COMMUNITY)

## 2024-02-26 ENCOUNTER — Encounter (HOSPITAL_COMMUNITY): Payer: Self-pay | Admitting: Emergency Medicine

## 2024-02-26 DIAGNOSIS — Z7951 Long term (current) use of inhaled steroids: Secondary | ICD-10-CM | POA: Insufficient documentation

## 2024-02-26 DIAGNOSIS — R000 Tachycardia, unspecified: Secondary | ICD-10-CM | POA: Insufficient documentation

## 2024-02-26 DIAGNOSIS — R0602 Shortness of breath: Secondary | ICD-10-CM | POA: Diagnosis not present

## 2024-02-26 DIAGNOSIS — Z7952 Long term (current) use of systemic steroids: Secondary | ICD-10-CM | POA: Diagnosis not present

## 2024-02-26 DIAGNOSIS — R059 Cough, unspecified: Secondary | ICD-10-CM | POA: Diagnosis not present

## 2024-02-26 DIAGNOSIS — J45901 Unspecified asthma with (acute) exacerbation: Secondary | ICD-10-CM | POA: Diagnosis not present

## 2024-02-26 LAB — CBC
HCT: 46.1 % (ref 39.0–52.0)
Hemoglobin: 15.5 g/dL (ref 13.0–17.0)
MCH: 27.9 pg (ref 26.0–34.0)
MCHC: 33.6 g/dL (ref 30.0–36.0)
MCV: 82.9 fL (ref 80.0–100.0)
Platelets: 410 10*3/uL — ABNORMAL HIGH (ref 150–400)
RBC: 5.56 MIL/uL (ref 4.22–5.81)
RDW: 13.7 % (ref 11.5–15.5)
WBC: 7.3 10*3/uL (ref 4.0–10.5)
nRBC: 0 % (ref 0.0–0.2)

## 2024-02-26 LAB — BASIC METABOLIC PANEL WITH GFR
Anion gap: 10 (ref 5–15)
BUN: 10 mg/dL (ref 6–20)
CO2: 24 mmol/L (ref 22–32)
Calcium: 10 mg/dL (ref 8.9–10.3)
Chloride: 104 mmol/L (ref 98–111)
Creatinine, Ser: 1.13 mg/dL (ref 0.61–1.24)
GFR, Estimated: >60 mL/min (ref >60)
Glucose, Bld: 95 mg/dL (ref 70–99)
Potassium: 3.7 mmol/L (ref 3.5–5.1)
Sodium: 138 mmol/L (ref 135–145)

## 2024-02-26 MED ORDER — ALBUTEROL SULFATE HFA 108 (90 BASE) MCG/ACT IN AERS: 1.0000 | INHALATION_SPRAY | Freq: Four times a day (QID) | RESPIRATORY_TRACT | 0 refills | Status: DC | PRN

## 2024-02-26 MED ORDER — METHYLPREDNISOLONE SODIUM SUCC 125 MG IJ SOLR
125.0000 mg | INTRAMUSCULAR | Status: AC
  Administered 2024-02-26: 125 mg via INTRAVENOUS
  Filled 2024-02-26: qty 2

## 2024-02-26 MED ORDER — MAGNESIUM SULFATE 2 GM/50ML IV SOLN
2.0000 g | Freq: Once | INTRAVENOUS | Status: AC
  Administered 2024-02-26: 2 g via INTRAVENOUS
  Filled 2024-02-26: qty 50

## 2024-02-26 MED ORDER — IPRATROPIUM-ALBUTEROL 0.5-2.5 (3) MG/3ML IN SOLN
3.0000 mL | Freq: Once | RESPIRATORY_TRACT | Status: AC
  Administered 2024-02-26: 3 mL via RESPIRATORY_TRACT
  Filled 2024-02-26: qty 3

## 2024-02-26 MED ORDER — PREDNISONE 20 MG PO TABS: 40.0000 mg | ORAL_TABLET | Freq: Every day | ORAL | 0 refills | Status: DC

## 2024-02-26 NOTE — Discharge Instructions (Signed)
 You were given a prescription for steroids, please take 2 tablets daily for the next 5 days.  You were also given a refill for albuterol , please take this medication as prescribed.

## 2024-02-26 NOTE — ED Triage Notes (Signed)
 Pt reports continued SOB.  Reports inhaler has not provided relief for his asthma.  Pt also reports chest tightness and a dry cough.  Pt has been smoking.

## 2024-02-26 NOTE — ED Provider Notes (Signed)
  Physical Exam  BP 138/84 (BP Location: Right Arm)   Pulse (!) 110   Temp 98 F (36.7 C)   Resp 18   SpO2 97%   Physical Exam Vitals and nursing note reviewed.  Constitutional:      Appearance: He is well-developed.  HENT:     Head: Normocephalic and atraumatic.  Eyes:     Pupils: Pupils are equal, round, and reactive to light.  Cardiovascular:     Rate and Rhythm: Tachycardia present.  Pulmonary:     Effort: Pulmonary effort is normal.     Breath sounds: No wheezing.  Chest:     Chest wall: No tenderness.  Musculoskeletal:     Right lower leg: No tenderness.     Left lower leg: No edema.  Skin:    General: Skin is warm and dry.  Neurological:     Mental Status: He is alert and oriented to person, place, and time.     Procedures  Procedures  ED Course / MDM    Medical Decision Making Amount and/or Complexity of Data Reviewed Labs: ordered. Radiology: ordered.  Risk Prescription drug management.    Patient care assumed from Franklin Ito. PA at shift change, please see his note for a full HPI. Briefly, patient here with asthma exacerbation x 1 day. Given Duoneb here, solumedrol. And magnesium  by prior provided. Lab work ordered by prior provider. Hx of tobacco use.   Patient rate auscultated by me, wheezing is very minimal.  I do feel that he is stable for discharge.  He does report that his inhaler has been failing him recently as he is been having to use it more often, therefore we will refer him to pulmonology in order to establish better care for his control of regulation of asthma.  He is agreeable to plan treatment, blood work is within normal limits.  X-ray without any signs of infection.  Hemodynamically stable for discharge.   Portions of this note were generated with Scientist, clinical (histocompatibility and immunogenetics). Dictation errors may occur despite best attempts at proofreading.        Edgardo Petrenko, PA-C 02/26/24 1610    Rory Collard, MD 02/27/24 [phone]

## 2024-02-26 NOTE — ED Provider Notes (Signed)
 [place] EMERGENCY DEPARTMENT AT [facility] Provider Note   CSN: [id number] Arrival date & time: 02/26/24  0557     History  Chief Complaint  Patient presents with   Shortness of Breath    Hector Gross is a 23 y.o. male with medical history of asthma.  The patient presents to the ED for evaluation of shortness of breath, wheezing.  Reports that he has felt this way for the last 2 days.  States he has tried albuterol  inhaler at home without relief.  Denies chest pain, lightheadedness, dizziness, weakness.  Denies fevers.  Denies nausea, vomiting or abdominal pain.  Reports he feels as if this is related to his asthma.   Shortness of Breath Associated symptoms: wheezing        Home Medications Prior to Admission medications   Medication Sig Start Date End Date Taking? Authorizing Provider  albuterol  (VENTOLIN  HFA) 108 (90 Base) MCG/ACT inhaler Inhale 1-2 puffs into the lungs every 6 (six) hours as needed for wheezing or shortness of breath. 02/26/24  Yes Adel Aden, PA-C  predniSONE  (DELTASONE ) 20 MG tablet Take 2 tablets (40 mg total) by mouth daily. 02/26/24  Yes Adel Aden, PA-C  mometasone -formoterol  (DULERA ) 200-5 MCG/ACT AERO Inhale 2 puffs into the lungs 2 (two) times daily. 01/14/24   Elisa Guest, PA-C  montelukast  (SINGULAIR ) 10 MG tablet Take 1 tablet (10 mg total) by mouth at bedtime. 09/24/23   Fleming, Zelda W, NP  ondansetron  (ZOFRAN ) 4 MG tablet Take 1 tablet (4 mg total) by mouth every 8 (eight) hours as needed for nausea or vomiting. 07/15/23   Verlyn Goad, MD  triamcinolone  cream (KENALOG ) 0.1 % Apply 1 Application topically 2 (two) times daily. Patient taking differently: Apply 1 Application topically daily as needed (for itching). 02/20/23   Fleming, Zelda W, NP      Allergies    Patient has no known allergies.    Review of Systems   Review of Systems  Respiratory:  Positive for chest tightness, shortness of breath and  wheezing.   All other systems reviewed and are negative.   Physical Exam Updated Vital Signs BP 138/84 (BP Location: Right Arm)   Pulse (!) 110   Temp 98 F (36.7 C)   Resp 18   SpO2 97%  Physical Exam Vitals and nursing note reviewed.  Constitutional:      General: He is not in acute distress.    Appearance: He is well-developed.  HENT:     Head: Normocephalic and atraumatic.  Eyes:     Conjunctiva/sclera: Conjunctivae normal.  Cardiovascular:     Rate and Rhythm: Normal rate and regular rhythm.     Heart sounds: No murmur heard. Pulmonary:     Effort: Pulmonary effort is normal. No respiratory distress.     Breath sounds: Wheezing present.     Comments: Bilateral expiratory wheeze Abdominal:     Palpations: Abdomen is soft.     Tenderness: There is no abdominal tenderness.  Musculoskeletal:        General: No swelling.     Cervical back: Neck supple.  Skin:    General: Skin is warm and dry.     Capillary Refill: Capillary refill takes less than 2 seconds.  Neurological:     Mental Status: He is alert.  Psychiatric:        Mood and Affect: Mood normal.     ED Results / Procedures / Treatments   Labs (  all labs ordered are listed, but only abnormal results are displayed) Labs Reviewed  CBC  BASIC METABOLIC PANEL WITH GFR    EKG None  Radiology No results found.  Procedures Procedures    Medications Ordered in ED Medications  ipratropium-albuterol  (DUONEB) 0.5-2.5 (3) MG/3ML nebulizer solution 3 mL (has no administration in time range)  methylPREDNISolone  sodium succinate (SOLU-MEDROL ) 125 mg/2 mL injection 125 mg (has no administration in time range)  magnesium  sulfate IVPB 2 g 50 mL (has no administration in time range)    ED Course/ Medical Decision Making/ A&P  Medical Decision Making Amount and/or Complexity of Data Reviewed Labs: ordered.  Risk Prescription drug management.   23 year old no presents for evaluation.  Please see HPI for  further details.  On examination patient is afebrile, tachycardic to 110.  Lung sounds with expiratory wheeze bilaterally.  Patient oxygen saturation 97% room air.  Abdomen soft and compressible.  Neurological examination at baseline.  No edema to bilateral lower extremities.  Will collect basic labs.  Will provide patient with DuoNeb, Solu-Medrol , mag.  Will reassess.  Update: At end of my shift, patient workup not complete.  Signed out to oncoming provider Soto PA-C.  Plan of management discussed.   Final Clinical Impression(s) / ED Diagnoses Final diagnoses:  Exacerbation of asthma, unspecified asthma severity, unspecified whether persistent    Rx / DC Orders ED Discharge Orders          Ordered    albuterol  (VENTOLIN  HFA) 108 (90 Base) MCG/ACT inhaler  Every 6 hours PRN        02/26/24 0617    predniSONE  (DELTASONE ) 20 MG tablet  Daily        02/26/24 0617              Adel Aden, PA-C 02/26/24 9147    Rory Collard, MD 02/27/24 [phone]

## 2024-02-28 ENCOUNTER — Telehealth: Payer: Self-pay

## 2024-02-28 DIAGNOSIS — J45901 Unspecified asthma with (acute) exacerbation: Secondary | ICD-10-CM

## 2024-02-28 DIAGNOSIS — J4541 Moderate persistent asthma with (acute) exacerbation: Secondary | ICD-10-CM

## 2024-02-28 NOTE — Progress Notes (Signed)
 Complex Care Management Note Care Guide Note  02/28/2024 Name: Hector Gross MRN: [id number] DOB: 2000-12-12   Complex Care Management Outreach Attempts: An unsuccessful telephone outreach was attempted today to offer the patient information about available complex care management services.  Follow Up Plan:  Additional outreach attempts will be made to offer the patient complex care management information and services.   Encounter Outcome:  No Answer  Lenton Rail , RMA     [place]  [facility], [facility] Guide  Direct Dial: [phone]  Website: [email or web address]

## 2024-02-29 ENCOUNTER — Emergency Department (HOSPITAL_COMMUNITY)
Admission: EM | Admit: 2024-02-29 | Discharge: 2024-02-29 | Disposition: A | Discharge status: Home / Self Care | Attending: Emergency Medicine | Admitting: Emergency Medicine

## 2024-02-29 ENCOUNTER — Emergency Department (HOSPITAL_COMMUNITY)

## 2024-02-29 ENCOUNTER — Other Ambulatory Visit: Payer: Self-pay

## 2024-02-29 ENCOUNTER — Encounter (HOSPITAL_COMMUNITY): Payer: Self-pay | Admitting: Emergency Medicine

## 2024-02-29 DIAGNOSIS — J4520 Mild intermittent asthma, uncomplicated: Secondary | ICD-10-CM | POA: Insufficient documentation

## 2024-02-29 DIAGNOSIS — R0602 Shortness of breath: Secondary | ICD-10-CM | POA: Diagnosis not present

## 2024-02-29 LAB — CBC WITH DIFFERENTIAL/PLATELET
Abs Immature Granulocytes: 0.02 10*3/uL (ref 0.00–0.07)
Basophils Absolute: 0 10*3/uL (ref 0.0–0.1)
Basophils Relative: 1 %
Eosinophils Absolute: 0.7 10*3/uL — ABNORMAL HIGH (ref 0.0–0.5)
Eosinophils Relative: 10 %
HCT: 46.1 % (ref 39.0–52.0)
Hemoglobin: 15.9 g/dL (ref 13.0–17.0)
Immature Granulocytes: 0 %
Lymphocytes Relative: 23 %
Lymphs Abs: 1.6 10*3/uL (ref 0.7–4.0)
MCH: 28 pg (ref 26.0–34.0)
MCHC: 34.5 g/dL (ref 30.0–36.0)
MCV: 81.3 fL (ref 80.0–100.0)
Monocytes Absolute: 0.7 10*3/uL (ref 0.1–1.0)
Monocytes Relative: 11 %
Neutro Abs: 3.8 10*3/uL (ref 1.7–7.7)
Neutrophils Relative %: 55 %
Platelets: 408 10*3/uL — ABNORMAL HIGH (ref 150–400)
RBC: 5.67 MIL/uL (ref 4.22–5.81)
RDW: 13.1 % (ref 11.5–15.5)
WBC: 6.8 10*3/uL (ref 4.0–10.5)
nRBC: 0 % (ref 0.0–0.2)

## 2024-02-29 LAB — BASIC METABOLIC PANEL WITH GFR
Anion gap: 9 (ref 5–15)
BUN: 17 mg/dL (ref 6–20)
CO2: 22 mmol/L (ref 22–32)
Calcium: 9.6 mg/dL (ref 8.9–10.3)
Chloride: 106 mmol/L (ref 98–111)
Creatinine, Ser: 1.15 mg/dL (ref 0.61–1.24)
GFR, Estimated: >60 mL/min (ref >60)
Glucose, Bld: 102 mg/dL — ABNORMAL HIGH (ref 70–99)
Potassium: 3.7 mmol/L (ref 3.5–5.1)
Sodium: 137 mmol/L (ref 135–145)

## 2024-02-29 LAB — RESP PANEL BY RT-PCR (RSV, FLU A&B, COVID)  RVPGX2
Influenza A by PCR: NEGATIVE
Influenza B by PCR: NEGATIVE
Resp Syncytial Virus by PCR: NEGATIVE
SARS Coronavirus 2 by RT PCR: NEGATIVE

## 2024-02-29 MED ORDER — IPRATROPIUM-ALBUTEROL 0.5-2.5 (3) MG/3ML IN SOLN
3.0000 mL | Freq: Once | RESPIRATORY_TRACT | Status: AC
  Administered 2024-02-29: 3 mL via RESPIRATORY_TRACT
  Filled 2024-02-29: qty 3

## 2024-02-29 MED ORDER — ALBUTEROL SULFATE HFA 108 (90 BASE) MCG/ACT IN AERS: 1.0000 | INHALATION_SPRAY | Freq: Four times a day (QID) | RESPIRATORY_TRACT | 0 refills | Status: DC | PRN

## 2024-02-29 MED ORDER — PREDNISONE 20 MG PO TABS: 60.0000 mg | ORAL_TABLET | Freq: Once | ORAL | Status: DC

## 2024-02-29 MED ORDER — ALBUTEROL SULFATE HFA 108 (90 BASE) MCG/ACT IN AERS
1.0000 | INHALATION_SPRAY | Freq: Once | RESPIRATORY_TRACT | Status: AC
  Administered 2024-02-29: 1 via RESPIRATORY_TRACT
  Filled 2024-02-29: qty 6.7

## 2024-02-29 NOTE — Discharge Instructions (Signed)
 I am glad you are feeling better.  Please follow-up your primary care provider.  Seek emergency care experiencing any new or worsening symptoms.  Finish the steroid pack prescribed you 3 days ago.

## 2024-02-29 NOTE — ED Provider Notes (Signed)
 [place] EMERGENCY DEPARTMENT AT [facility] Provider Note   CSN: [id number] Arrival date & time: 02/29/24  1210     History  Chief Complaint  Patient presents with   Shortness of Breath    Hector Gross is a 23 y.o. male with PMHx asthma who presents to ED requesting a new albuterol  inhaler.  Patient endorses coughing and SOB that started this morning.  Patient was already given a DuoNeb treatment and states that his symptoms feel better.  Patient denies recent fever, chest pain, nausea, vomiting, diarrhea.    Shortness of Breath      Home Medications Prior to Admission medications   Medication Sig Start Date End Date Taking? Authorizing Provider  albuterol  (VENTOLIN  HFA) 108 (90 Base) MCG/ACT inhaler Inhale 1-2 puffs into the lungs every 6 (six) hours as needed for wheezing or shortness of breath. 02/29/24   Edgar Bureau, PA-C  mometasone -formoterol  (DULERA ) 200-5 MCG/ACT AERO Inhale 2 puffs into the lungs 2 (two) times daily. 01/14/24   Elisa Guest, PA-C  montelukast  (SINGULAIR ) 10 MG tablet Take 1 tablet (10 mg total) by mouth at bedtime. 09/24/23   Fleming, Zelda W, NP  ondansetron  (ZOFRAN ) 4 MG tablet Take 1 tablet (4 mg total) by mouth every 8 (eight) hours as needed for nausea or vomiting. 07/15/23   Verlyn Goad, MD  predniSONE  (DELTASONE ) 20 MG tablet Take 2 tablets (40 mg total) by mouth daily. 02/26/24   Adel Aden, PA-C  triamcinolone  cream (KENALOG ) 0.1 % Apply 1 Application topically 2 (two) times daily. Patient taking differently: Apply 1 Application topically daily as needed (for itching). 02/20/23   Fleming, Zelda W, NP      Allergies    Patient has no known allergies.    Review of Systems   Review of Systems  Respiratory:  Positive for shortness of breath.     Physical Exam Updated Vital Signs BP 131/76 (BP Location: Right Arm)   Pulse 83   Temp 97.7 F (36.5 C)   Resp 16   Ht 6' (1.829 m)   Wt 97 kg   SpO2 94%    BMI 29.00 kg/m  Physical Exam Vitals and nursing note reviewed.  Constitutional:      General: He is not in acute distress.    Appearance: He is not ill-appearing or toxic-appearing.  HENT:     Head: Normocephalic and atraumatic.     Mouth/Throat:     Mouth: Mucous membranes are moist.     Pharynx: No posterior oropharyngeal erythema.  Eyes:     General: No scleral icterus.       Right eye: No discharge.        Left eye: No discharge.     Conjunctiva/sclera: Conjunctivae normal.  Cardiovascular:     Rate and Rhythm: Normal rate and regular rhythm.     Pulses: Normal pulses.     Heart sounds: Normal heart sounds. No murmur heard. Pulmonary:     Effort: Pulmonary effort is normal. No respiratory distress.     Breath sounds: Wheezing present. No rhonchi or rales.     Comments: Mild expiratory wheezing Abdominal:     Palpations: Abdomen is soft.  Musculoskeletal:     Right lower leg: No edema.     Left lower leg: No edema.  Skin:    General: Skin is warm and dry.     Findings: No rash.  Neurological:     General: No focal deficit  present.     Mental Status: He is alert and oriented to person, place, and time. Mental status is at baseline.  Psychiatric:        Mood and Affect: Mood normal.        Behavior: Behavior normal.     ED Results / Procedures / Treatments   Labs (all labs ordered are listed, but only abnormal results are displayed) Labs Reviewed  BASIC METABOLIC PANEL WITH GFR - Abnormal; Notable for the following components:      Result Value   Glucose, Bld 102 (*)    All other components within normal limits  CBC WITH DIFFERENTIAL/PLATELET - Abnormal; Notable for the following components:   Platelets 408 (*)    Eosinophils Absolute 0.7 (*)    All other components within normal limits  RESP PANEL BY RT-PCR (RSV, FLU A&B, COVID)  RVPGX2    EKG None  Radiology DG Chest Port 1 View Result Date: 02/29/2024 CLINICAL DATA:  Short of breath EXAM: PORTABLE  CHEST 1 VIEW COMPARISON:  02/26/2024 FINDINGS: 2 frontal views of the chest demonstrate an unremarkable cardiac silhouette. No airspace disease, effusion, or pneumothorax. No acute bony abnormalities. IMPRESSION: 1. No acute intrathoracic process. Electronically Signed   By: Bobbye Burrow M.D.   On: 02/29/2024 13:10    Procedures Procedures    Medications Ordered in ED Medications  ipratropium-albuterol  (DUONEB) 0.5-2.5 (3) MG/3ML nebulizer solution 3 mL (has no administration in time range)  ipratropium-albuterol  (DUONEB) 0.5-2.5 (3) MG/3ML nebulizer solution 3 mL (3 mLs Nebulization Given 02/29/24 1256)  albuterol  (VENTOLIN  HFA) 108 (90 Base) MCG/ACT inhaler 1 puff (1 puff Inhalation Given 02/29/24 1424)    ED Course/ Medical Decision Making/ A&P                                 Medical Decision Making Amount and/or Complexity of Data Reviewed Labs: ordered. Radiology: ordered.  Risk Prescription drug management.   This patient presents to the ED for concern of shortness of breath, this involves an extensive number of treatment options, and is a complaint that carries with it a high risk of complications and morbidity.  The differential diagnosis includes Anxiety, Anaphylaxis/Angioedema, Aspirated FB, Arrhythmia, CHF, Asthma, COPD, PNA, COVID/Flu/RSV, STEMI, Tamponade, TPNX, Sepsis   Co morbidities that complicate the patient evaluation  asthma   Additional history obtained:  Dr. Jamal Mays PCP   Problem List / ED Course / Critical interventions / Medication management  Patient presents to ED requesting new albuterol  rescue inhaler.  Patient ran out yesterday.  Patient stated he woke up today with cough and shortness of breath.  Denies any other concerning symptoms today.  Physical exam with very mild expiratory wheezing.  Rest physical exam reassuring.  Patient afebrile with stable vitals. I Ordered, and personally interpreted labs.  Respiratory panel negative.  BMP  reassuring.  CBC without leukocytosis or anemia. The patient was maintained on a cardiac monitor.  I personally viewed and interpreted the cardiac monitored which showed an underlying rhythm of: Sinus rhythm I ordered imaging studies including chest xray to assess for process contributing to patient's symptoms. I independently visualized and interpreted imaging which showed no acute cardiopulmonary disease. I agree with the radiologist interpretation Provided patient with a new albuterol  inhaler in ED.  Patient stating that he is feeling better.  Will also send an extra rescue inhaler to his pharmacy.  Patient was prescribed steroid pack 3 days  ago so I will educate him to continue that steroid pack.  Recommended following up with PCP.  Patient verbalized understanding the plan. I have reviewed the patients home medicines and have made adjustments as needed The patient has been appropriately medically screened and/or stabilized in the ED. I have low suspicion for any other emergent medical condition which would require further screening, evaluation or treatment in the ED or require inpatient management. At time of discharge the patient is hemodynamically stable and in no acute distress. I have discussed work-up results and diagnosis with patient and answered all questions. Patient is agreeable with discharge plan. We discussed strict return precautions for returning to the emergency department and they verbalized understanding.     Social Determinants of Health:  none          Final Clinical Impression(s) / ED Diagnoses Final diagnoses:  Mild intermittent asthma without complication    Rx / DC Orders ED Discharge Orders          Ordered    albuterol  (VENTOLIN  HFA) 108 (90 Base) MCG/ACT inhaler  Every 6 hours PRN        02/29/24 1428              Glencoe Bureau, PA-C 02/29/24 1429    Tegeler, Marine Sia, MD 02/29/24 [phone]

## 2024-02-29 NOTE — ED Triage Notes (Signed)
 Pt reports SOB when waking today. No relief with home inhalers. States he was unable to get prescriptions filled from previous visit a few days ago due to insurance.

## 2024-03-02 ENCOUNTER — Encounter (HOSPITAL_COMMUNITY): Payer: Self-pay | Admitting: Emergency Medicine

## 2024-03-02 ENCOUNTER — Emergency Department (HOSPITAL_COMMUNITY)
Admission: EM | Admit: 2024-03-02 | Discharge: 2024-03-02 | Disposition: A | Discharge status: Home / Self Care | Attending: Emergency Medicine | Admitting: Emergency Medicine

## 2024-03-02 ENCOUNTER — Emergency Department (HOSPITAL_COMMUNITY)

## 2024-03-02 DIAGNOSIS — J45909 Unspecified asthma, uncomplicated: Secondary | ICD-10-CM | POA: Insufficient documentation

## 2024-03-02 DIAGNOSIS — R062 Wheezing: Secondary | ICD-10-CM | POA: Diagnosis not present

## 2024-03-02 DIAGNOSIS — D72829 Elevated white blood cell count, unspecified: Secondary | ICD-10-CM | POA: Insufficient documentation

## 2024-03-02 DIAGNOSIS — J00 Acute nasopharyngitis [common cold]: Secondary | ICD-10-CM | POA: Insufficient documentation

## 2024-03-02 DIAGNOSIS — R0602 Shortness of breath: Secondary | ICD-10-CM | POA: Diagnosis not present

## 2024-03-02 DIAGNOSIS — G4489 Other headache syndrome: Secondary | ICD-10-CM | POA: Diagnosis not present

## 2024-03-02 DIAGNOSIS — R059 Cough, unspecified: Secondary | ICD-10-CM | POA: Diagnosis not present

## 2024-03-02 LAB — CBC WITH DIFFERENTIAL/PLATELET
Abs Immature Granulocytes: 0.06 10*3/uL (ref 0.00–0.07)
Basophils Absolute: 0 10*3/uL (ref 0.0–0.1)
Basophils Relative: 0 %
Eosinophils Absolute: 0.1 10*3/uL (ref 0.0–0.5)
Eosinophils Relative: 1 %
HCT: 46.9 % (ref 39.0–52.0)
Hemoglobin: 16 g/dL (ref 13.0–17.0)
Immature Granulocytes: 1 %
Lymphocytes Relative: 5 %
Lymphs Abs: 0.7 10*3/uL (ref 0.7–4.0)
MCH: 28.1 pg (ref 26.0–34.0)
MCHC: 34.1 g/dL (ref 30.0–36.0)
MCV: 82.4 fL (ref 80.0–100.0)
Monocytes Absolute: 0.1 10*3/uL (ref 0.1–1.0)
Monocytes Relative: 1 %
Neutro Abs: 11.5 10*3/uL — ABNORMAL HIGH (ref 1.7–7.7)
Neutrophils Relative %: 92 %
Platelets: 419 10*3/uL — ABNORMAL HIGH (ref 150–400)
RBC: 5.69 MIL/uL (ref 4.22–5.81)
RDW: 13.2 % (ref 11.5–15.5)
WBC: 12.3 10*3/uL — ABNORMAL HIGH (ref 4.0–10.5)
nRBC: 0 % (ref 0.0–0.2)

## 2024-03-02 LAB — BASIC METABOLIC PANEL WITH GFR
Anion gap: 10 (ref 5–15)
BUN: 13 mg/dL (ref 6–20)
CO2: 24 mmol/L (ref 22–32)
Calcium: 9.8 mg/dL (ref 8.9–10.3)
Chloride: 105 mmol/L (ref 98–111)
Creatinine, Ser: 1.13 mg/dL (ref 0.61–1.24)
GFR, Estimated: >60 mL/min (ref >60)
Glucose, Bld: 104 mg/dL — ABNORMAL HIGH (ref 70–99)
Potassium: 4 mmol/L (ref 3.5–5.1)
Sodium: 139 mmol/L (ref 135–145)

## 2024-03-02 LAB — RESP PANEL BY RT-PCR (RSV, FLU A&B, COVID)  RVPGX2
Influenza A by PCR: NEGATIVE
Influenza B by PCR: NEGATIVE
Resp Syncytial Virus by PCR: NEGATIVE
SARS Coronavirus 2 by RT PCR: NEGATIVE

## 2024-03-02 MED ORDER — DEXAMETHASONE 4 MG PO TABS
10.0000 mg | ORAL_TABLET | Freq: Once | ORAL | Status: AC
  Administered 2024-03-02: 10 mg via ORAL
  Filled 2024-03-02: qty 3

## 2024-03-02 MED ORDER — IPRATROPIUM-ALBUTEROL 0.5-2.5 (3) MG/3ML IN SOLN
3.0000 mL | Freq: Once | RESPIRATORY_TRACT | Status: AC
  Administered 2024-03-02: 3 mL via RESPIRATORY_TRACT
  Filled 2024-03-02: qty 3

## 2024-03-02 NOTE — ED Provider Notes (Signed)
 [place] EMERGENCY DEPARTMENT AT [facility] Provider Note  History  Chief Complaint:  URI   URI Presenting symptoms: congestion and cough   Presenting symptoms: no fever, no rhinorrhea and no sore throat   Congestion:    Location:  Nasal Severity:  Mild Onset quality:  Gradual Duration:  3 days Timing:  Constant Progression:  Worsening Chronicity:  Recurrent Associated symptoms: wheezing   Wheezing:    Severity:  Mild   Onset quality:  Gradual   Chronicity:  Recurrent Risk factors comment:  Hx of asthma    RAYMIR FROMMELT is a 23 y.o. male with a history of asthma who presents the emergency department for URI and wheezing.  Past Medical History:  Diagnosis Date   Asthma     History reviewed. No pertinent surgical history.  Family History  Problem Relation Age of Onset   Diabetes Maternal Grandmother     Social History   Tobacco Use   Smoking status: Never   Smokeless tobacco: Never  Vaping Use   Vaping status: Never Used  Substance Use Topics   Alcohol use: No   Drug use: Yes    Types: Marijuana    Review of Systems  Review of Systems  Constitutional:  Negative for fever.  HENT:  Positive for congestion. Negative for rhinorrhea and sore throat.   Respiratory:  Positive for cough and wheezing.      Reviewed and documented in HPI if pertinent.   Physical Exam   ED Triage Vitals  Encounter Vitals Group     BP 03/02/24 1203 130/89     Systolic BP Percentile --      Diastolic BP Percentile --      Pulse Rate 03/02/24 1203 (!) 103     Resp 03/02/24 1203 20     Temp 03/02/24 1203 98 F (36.7 C)     Temp src --      SpO2 03/02/24 1203 100 %     Weight --      Height --      Head Circumference --      Peak Flow --      Pain Score 03/02/24 1202 4     Pain Loc --      Pain Education --      Exclude from Growth Chart --      Physical Exam Vitals and nursing note reviewed.  Constitutional:      General: He is not in acute  distress.    Appearance: He is well-developed.  HENT:     Head: Normocephalic and atraumatic.  Eyes:     Conjunctiva/sclera: Conjunctivae normal.  Cardiovascular:     Rate and Rhythm: Normal rate and regular rhythm.     Heart sounds: No murmur heard. Pulmonary:     Effort: Pulmonary effort is normal. No respiratory distress.     Breath sounds: Examination of the right-upper field reveals wheezing. Examination of the left-upper field reveals wheezing. Examination of the right-middle field reveals wheezing. Examination of the left-middle field reveals wheezing. Examination of the right-lower field reveals wheezing. Examination of the left-lower field reveals wheezing. Wheezing present. No decreased breath sounds.  Abdominal:     Palpations: Abdomen is soft.     Tenderness: There is no abdominal tenderness.  Musculoskeletal:        General: No swelling.     Cervical back: Neck supple.  Skin:    General: Skin is warm and dry.     Capillary  Refill: Capillary refill takes less than 2 seconds.  Neurological:     Mental Status: He is alert.  Psychiatric:        Mood and Affect: Mood normal.      Procedures   Procedures  ED Course - Medical Decision Making  Brief Overview MICHAELA SHANKEL is a 23 y.o. male who presents as per above.  I have reviewed the nursing documentation for past medical history, family history, and social history and agree.  I have reviewed the patient's vital signs. There are no abnormalities.  Initial Differential Diagnoses: I am primarily concerned for mild asthma exacerbation, URI, pneumonia, pneumothorax.  Therapies: These medications and interventions were provided for the patient while in the ED.  Medications  ipratropium-albuterol  (DUONEB) 0.5-2.5 (3) MG/3ML nebulizer solution 3 mL (3 mLs Nebulization Given 03/02/24 1525)  dexamethasone  (DECADRON ) tablet 10 mg (10 mg Oral Given 03/02/24 1525)  ipratropium-albuterol  (DUONEB) 0.5-2.5 (3) MG/3ML  nebulizer solution 3 mL (3 mLs Nebulization Given 03/02/24 1723)    Testing Results: On my interpretation labs are significant for : Mild leukocytosis Cr 1.13  On my interpretation imaging is significant for: CXR without opacity or PNA  EKG Interpretation Date/Time:  Monday Mar 02 2024 14:55:50 EDT Ventricular Rate:  81 PR Interval:  146 QRS Duration:  76 QT Interval:  358 QTC Calculation: 415 R Axis:   78  Text Interpretation: Normal sinus rhythm with sinus arrhythmia Normal ECG No significant change since last tracing Confirmed by Celesta Coke ([id number]) on 03/02/2024 3:44:29 PM   See the EMR for full details regarding lab and imaging results.   Medical Decision Making 23 year old male who presents the emergency department for URI symptoms and wheezing.  Patient does have a history of asthma.  Will be the third visit this week for similar symptoms.  On exam patient does have expiratory wheezing.  He is in no respiratory distress.  He is afebrile.  He is oxygenating well on room air.  He does have congestion and rhinorrhea on exam.  Currently I feel the patient is likely in a mild asthma exacerbation due to URI symptoms.  Patient on review of dispense report has not picked up prednisone .  Therefore we will give a DuoNeb treatment here and give Decadron .  Decadron  should last for approximately 72 hours therefore patient should not need prednisone  burst pack.  X-rays were performed which revealed no focal opacities suggest pneumonia.  Patient does have a mild leukocytosis which is expected in his current condition.  He has no electrolyte abnormalities.  EKG is nonischemic.  Reexamination prior to discharge reveals improvement wheezing however mild persistence therefore we will give additional DuoNeb therapy.  We discussed that he needs to follow-up with his primary care doctor for initiation of likely asthma suppressant medication.  He does have an albuterol  rescue inhaler at home.   Patient discharged in hemodynamically stable condition.  Problems Addressed: Wheezing: chronic illness or injury with exacerbation, progression, or side effects of treatment  Risk Prescription drug management.     ### All radiography studies, electrocardiograms, and laboratory data were personally reviewed by me and incorporated into my medical decision making. Impression   1. Acute nasopharyngitis   2. Wheezing      Note: Engineer, civil (consulting) software was used in the creation of this note.     Arminda Landmark, MD 03/02/24 1809    Celesta Coke K, DO 03/02/24 2316

## 2024-03-02 NOTE — ED Triage Notes (Signed)
 Pt BIB by EMS reports cough and congestion. Hx of asthma, prescribed prednisone  but unable to pick up. Given 10 mg Albuterol , 125 mg Solu-Medrol , and 0.5 mg Atrovent .  EMS VS: BP 138/78 HR 90 100% on Neb

## 2024-03-02 NOTE — Discharge Instructions (Addendum)
 Hector Gross:  Thank you for allowing us  to take care of you today.  We hope you begin feeling better soon. You were seen today for shortness of breath, nasal congestion.  It appears that you had a mild asthma exacerbation.  We did give you steroids which will last 3 days.  You do not need to pick up your steroid burst pack.  No antibiotics needed currently.  There is no evidence of pneumonia.  Likely triggered by a upper respiratory infection which is viral in nature.  To-Do:  Please follow-up with your primary doctor within the next 2-3 days. It is important that you review any labs or imaging results (if any) that you had today with them. Your preliminary imaging results (if any) are attached. Please return to the Emergency Department or call 911 if you experience chest pain, shortness of breath, severe pain, severe fever, altered mental status, or have any reason to think that you need emergency medical care.  Thank you again.  Hope you feel better soon.  Arminda Landmark, MD Department of Emergency Medicine

## 2024-03-03 ENCOUNTER — Other Ambulatory Visit: Payer: Self-pay | Admitting: Nurse Practitioner

## 2024-03-03 ENCOUNTER — Encounter (HOSPITAL_COMMUNITY): Payer: Self-pay | Admitting: Emergency Medicine

## 2024-03-03 ENCOUNTER — Telehealth: Payer: Self-pay | Admitting: Nurse Practitioner

## 2024-03-03 ENCOUNTER — Encounter: Payer: Self-pay | Admitting: Nurse Practitioner

## 2024-03-03 ENCOUNTER — Other Ambulatory Visit: Payer: Self-pay

## 2024-03-03 ENCOUNTER — Emergency Department (HOSPITAL_COMMUNITY)
Admission: EM | Admit: 2024-03-03 | Discharge: 2024-03-03 | Disposition: A | Discharge status: Home / Self Care | Attending: Emergency Medicine | Admitting: Emergency Medicine

## 2024-03-03 DIAGNOSIS — J4520 Mild intermittent asthma, uncomplicated: Secondary | ICD-10-CM | POA: Insufficient documentation

## 2024-03-03 DIAGNOSIS — J4541 Moderate persistent asthma with (acute) exacerbation: Secondary | ICD-10-CM

## 2024-03-03 DIAGNOSIS — R062 Wheezing: Secondary | ICD-10-CM | POA: Diagnosis present

## 2024-03-03 MED ORDER — PREDNISONE 20 MG PO TABS
60.0000 mg | ORAL_TABLET | Freq: Once | ORAL | Status: AC
  Administered 2024-03-03: 60 mg via ORAL
  Filled 2024-03-03: qty 3

## 2024-03-03 MED ORDER — DULERA 200-5 MCG/ACT IN AERO: 2.0000 | INHALATION_SPRAY | Freq: Two times a day (BID) | RESPIRATORY_TRACT | 1 refills | Status: DC

## 2024-03-03 MED ORDER — METHYLPREDNISOLONE SODIUM SUCC 125 MG IJ SOLR: 125.0000 mg | Freq: Once | INTRAMUSCULAR | Status: DC

## 2024-03-03 MED ORDER — IPRATROPIUM-ALBUTEROL 0.5-2.5 (3) MG/3ML IN SOLN
3.0000 mL | Freq: Once | RESPIRATORY_TRACT | Status: AC
  Administered 2024-03-03: 3 mL via RESPIRATORY_TRACT
  Filled 2024-03-03: qty 3

## 2024-03-03 NOTE — Discharge Instructions (Addendum)
 You had improvement after medicines.  You ambulated without any drop in your oxygen saturation.  Return for any emergent symptoms.  Otherwise follow-up with your primary care provider.  Take your home prednisone .

## 2024-03-03 NOTE — Telephone Encounter (Signed)
 Patients mother called in pleading for a refill for her son. I have sent refill. I did advise the mother of patient that he would need to keep his upcoming appointment for any future refills and if she fills the medication that the ED is providing is not effective then to let them know that.

## 2024-03-03 NOTE — Telephone Encounter (Signed)
 Duplicate message.

## 2024-03-03 NOTE — ED Provider Notes (Signed)
 [place] EMERGENCY DEPARTMENT AT [facility] Provider Note   CSN: [id number] Arrival date & time: 03/03/24  1331     History  Chief Complaint  Patient presents with   Asthma    Hector Gross is a 23 y.o. male.  23 year old male presents today for concern of asthma exacerbation.  He states he was seen yesterday and had some improvement but woke up this morning and he noticed some wheezing.  He said he uses albuterol  inhaler at home without significant improvement.  He denies any chest pain or other concerning findings.  The history is provided by the patient. No language interpreter was used.       Home Medications Prior to Admission medications   Medication Sig Start Date End Date Taking? Authorizing Provider  albuterol  (VENTOLIN  HFA) 108 (90 Base) MCG/ACT inhaler Inhale 1-2 puffs into the lungs every 6 (six) hours as needed for wheezing or shortness of breath. 02/29/24   Bowdle Bureau, PA-C  mometasone -formoterol  (DULERA ) 200-5 MCG/ACT AERO Inhale 2 puffs into the lungs 2 (two) times daily. 03/03/24   Fleming, Zelda W, NP  montelukast  (SINGULAIR ) 10 MG tablet Take 1 tablet (10 mg total) by mouth at bedtime. 09/24/23   Fleming, Zelda W, NP  ondansetron  (ZOFRAN ) 4 MG tablet Take 1 tablet (4 mg total) by mouth every 8 (eight) hours as needed for nausea or vomiting. 07/15/23   Verlyn Goad, MD  predniSONE  (DELTASONE ) 20 MG tablet Take 2 tablets (40 mg total) by mouth daily. 02/26/24   Adel Aden, PA-C  triamcinolone  cream (KENALOG ) 0.1 % Apply 1 Application topically 2 (two) times daily. Patient taking differently: Apply 1 Application topically daily as needed (for itching). 02/20/23   Fleming, Zelda W, NP      Allergies    Patient has no known allergies.    Review of Systems   Review of Systems  Constitutional:  Negative for fever.  Respiratory:  Positive for wheezing. Negative for shortness of breath.   All other systems reviewed and are  negative.   Physical Exam Updated Vital Signs BP (!) 125/93 (BP Location: Right Arm)   Pulse 80   Temp 97.6 F (36.4 C) (Oral)   Resp 18   Wt 97 kg   SpO2 100%   BMI 29.00 kg/m  Physical Exam Vitals and nursing note reviewed.  Constitutional:      General: He is not in acute distress.    Appearance: Normal appearance. He is not ill-appearing.  HENT:     Head: Normocephalic and atraumatic.     Nose: Nose normal.  Eyes:     Conjunctiva/sclera: Conjunctivae normal.  Cardiovascular:     Rate and Rhythm: Normal rate and regular rhythm.  Pulmonary:     Effort: Pulmonary effort is normal. No respiratory distress.     Breath sounds: Wheezing present.  Musculoskeletal:        General: No deformity. Normal range of motion.     Cervical back: Normal range of motion.  Skin:    Findings: No rash.  Neurological:     Mental Status: He is alert.     ED Results / Procedures / Treatments   Labs (all labs ordered are listed, but only abnormal results are displayed) Labs Reviewed - No data to display  EKG None  Radiology DG Chest 2 View Result Date: 03/02/2024 CLINICAL DATA:  Shortness of breath EXAM: CHEST - 2 VIEW COMPARISON:  X-ray 02/29/2024 and older FINDINGS: The heart  size and mediastinal contours are within normal limits. No consolidation, pneumothorax or effusion. No edema. The visualized skeletal structures are unremarkable. IMPRESSION: No acute cardiopulmonary disease. Electronically Signed   By: Adrianna Horde M.D.   On: 03/02/2024 15:53    Procedures Procedures    Medications Ordered in ED Medications  ipratropium-albuterol  (DUONEB) 0.5-2.5 (3) MG/3ML nebulizer solution 3 mL (3 mLs Nebulization Given 03/03/24 2154)  predniSONE  (DELTASONE ) tablet 60 mg (60 mg Oral Given 03/03/24 2154)    ED Course/ Medical Decision Making/ A&P                                 Medical Decision Making Risk Prescription drug management.   Medical Decision Making / ED  Course   This patient presents to the ED for concern of wheeze, this involves an extensive number of treatment options, and is a complaint that carries with it a high risk of complications and morbidity.  The differential diagnosis includes asthma exacerbation, pneumonia, viral URI  MDM: 23 year old male presents today for concern of asthma exacerbation.  He has been using albuterol  inhaler without improvement.  Had an x-ray yesterday without any evidence of pneumonia or other acute concern.  DuoNeb and Solu-Medrol  given with significant improvement.  No wheezing.  He feels improved.  He ambulated without any desaturation.  He is appropriate for discharge.  He feels comfortable with this.  Discussed close follow-up with his PCP.   Additional history obtained: -Additional history obtained from recent ed visit -External records from outside source obtained and reviewed including: Chart review including previous notes, labs, imaging, consultation notes   Lab Tests: -I ordered, reviewed, and interpreted labs.   The pertinent results include:   Labs Reviewed - No data to display    EKG  EKG Interpretation Date/Time:    Ventricular Rate:    PR Interval:    QRS Duration:    QT Interval:    QTC Calculation:   R Axis:      Text Interpretation:           Medicines ordered and prescription drug management: Meds ordered this encounter  Medications   DISCONTD: methylPREDNISolone  sodium succinate (SOLU-MEDROL ) 125 mg/2 mL injection 125 mg    IV methylprednisolone  will be converted to either a q12h or q24h frequency with the same total daily dose (TDD).  Ordered Dose: 1 to 125 mg TDD; convert to: TDD q24h.  Ordered Dose: 126 to 250 mg TDD; convert to: TDD div q12h.  Ordered Dose: >250 mg TDD; DAW.   ipratropium-albuterol  (DUONEB) 0.5-2.5 (3) MG/3ML nebulizer solution 3 mL   predniSONE  (DELTASONE ) tablet 60 mg    -I have reviewed the patients home medicines and have made  adjustments as needed   Reevaluation: After the interventions noted above, I reevaluated the patient and found that they have :improved  Co morbidities that complicate the patient evaluation  Past Medical History:  Diagnosis Date   Asthma       Dispostion: Discharged in stable condition.  Return precaution discussed.   Final Clinical Impression(s) / ED Diagnoses Final diagnoses:  Mild intermittent asthma, unspecified whether complicated    Rx / DC Orders ED Discharge Orders     None         Lucina Sabal, PA-C 03/03/24 2259    Sallyanne Creamer, DO 03/06/24 1155

## 2024-03-03 NOTE — Telephone Encounter (Signed)
 Copied from CRM [id number]. Topic: Clinical - Medication Refill >> Mar 03, 2024 10:56 AM Ivette P wrote:  Medication: mometasone -formoterol  (DULERA ) 200-5 MCG/ACT AERO   Has the patient contacted their pharmacy? Yes (Agent: If no, request that the patient contact the pharmacy for the refill. If patient does not wish to contact the pharmacy document the reason why and proceed with request.) (Agent: If yes, when and what did the pharmacy advise?)  This is the patient's preferred pharmacy:  [id number] - [place], [place] - [address] AT [facility] OF [address] [place] [address] Phone: [phone] Fax: [phone]  Is this the correct pharmacy for this prescription? Yes If no, delete pharmacy and type the correct one.   Has the prescription been filled recently? Yes, 01/14/2024  Is the patient out of the medication? Yes  Has the patient been seen for an appointment in the last year OR does the patient have an upcoming appointment? Yes, 03/30/2024  Can we respond through MyChart? Yes  Agent: Please be advised that Rx refills may take up to 3 business days. We ask that you follow-up with your pharmacy. >> Mar 03, 2024 11:38 AM Emylou G wrote: Mom called said [facility] has been trying to contact us  on the refill - I adv I don't see record of it?  Please adv.. I did tell her the the turnaround time on refills.. moms number [phone] Lavell Portugal )

## 2024-03-03 NOTE — ED Provider Triage Note (Signed)
 Emergency Medicine Provider Triage Evaluation Note  Hector Gross , a 23 y.o. male  was evaluated in triage.  Pt complains of SOB. Going for a few days, seen yesterday without acute change in symptoms. Taken albuterol  inhaler without improvement  Review of Systems  Positive: SOB, congestion  Negative: fever  Physical Exam  BP 126/85 (BP Location: Right Arm)   Pulse 86   Temp 98.2 F (36.8 C)   Resp 14   Wt 97 kg   SpO2 95%   BMI 29.00 kg/m  Gen:   Awake, no distress   Resp:  Normal effort, mild wheezing, full sentences  MSK:   Moves extremities without difficulty  Other:    Medical Decision Making  Medically screening exam initiated at 3:12 PM.  Appropriate orders placed.  Hector Gross was informed that the remainder of the evaluation will be completed by another provider, this initial triage assessment does not replace that evaluation, and the importance of remaining in the ED until their evaluation is complete.  Vitals are stable.  In no acute distress and not hypoxic.  Does have mild wheezing.  Will not repeat imaging and labs as they were done yesterday with no acute change in symptoms.   Hector Heron, PA-C 03/03/24 1513

## 2024-03-03 NOTE — Telephone Encounter (Signed)
 Patient called the office today with questions regarding her Medication: mometasone -formoterol  . The call was transferred to [facility] to assist further.

## 2024-03-03 NOTE — ED Triage Notes (Signed)
 Patient c/o sob since this AM, unrelieved by inhaler.  Patient seen last night for same.  Able to talk in complete sentences, in NAD.

## 2024-03-03 NOTE — Telephone Encounter (Signed)
 Copied from CRM [id number]. Topic: Clinical - Medication Refill >> Mar 03, 2024 10:56 AM Ivette P wrote:  Medication: mometasone -formoterol  (DULERA ) 200-5 MCG/ACT AERO   Has the patient contacted their pharmacy? Yes (Agent: If no, request that the patient contact the pharmacy for the refill. If patient does not wish to contact the pharmacy document the reason why and proceed with request.) (Agent: If yes, when and what did the pharmacy advise?)  This is the patient's preferred pharmacy:  [id number] - [place], [place] - [address] AT [facility] OF [address] [place] [address] Phone: [phone] Fax: [phone]  Is this the correct pharmacy for this prescription? Yes If no, delete pharmacy and type the correct one.   Has the prescription been filled recently? Yes, 01/14/2024  Is the patient out of the medication? Yes  Has the patient been seen for an appointment in the last year OR does the patient have an upcoming appointment? Yes, 03/30/2024  Can we respond through MyChart? Yes  Agent: Please be advised that Rx refills may take up to 3 business days. We ask that you follow-up with your pharmacy. >> Mar 03, 2024 12:02 PM Felizardo Hotter wrote: Pt's mother called again regarding approval from [facility] NP for mometasone -formoterol  (DULERA ) 200-5 MCG/ACT AERO. P. Pt's mother Jeannie Milo would like a call [phone]  back.  >> Mar 03, 2024 11:38 AM Emylou G wrote: Mom called said [facility] has been trying to contact us  on the refill - I adv I don't see record of it?  Please adv.. I did tell her the the turnaround time on refills.. moms number [phone] Lavell Portugal )

## 2024-03-03 NOTE — Telephone Encounter (Signed)
 Copied from CRM [id number]. Topic: Clinical - Medication Refill >> Mar 03, 2024 10:56 AM Ivette P wrote: Medication: mometasone -formoterol  (DULERA ) 200-5 MCG/ACT AERO   Has the patient contacted their pharmacy? Yes (Agent: If no, request that the patient contact the pharmacy for the refill. If patient does not wish to contact the pharmacy document the reason why and proceed with request.) (Agent: If yes, when and what did the pharmacy advise?)  This is the patient's preferred pharmacy:  [id number] - [place], [place] - [address] AT [facility] [address] [place] [address] Phone: [phone] Fax: [phone]  Is this the correct pharmacy for this prescription? Yes If no, delete pharmacy and type the correct one.   Has the prescription been filled recently? Yes, 01/14/2024  Is the patient out of the medication? Yes  Has the patient been seen for an appointment in the last year OR does the patient have an upcoming appointment? Yes, 03/30/2024  Can we respond through MyChart? Yes  Agent: Please be advised that Rx refills may take up to 3 business days. We ask that you follow-up with your pharmacy.

## 2024-03-04 NOTE — Progress Notes (Signed)
 Complex Care Management Note  Care Guide Note 03/04/2024 Name: Hector Gross MRN: [id number] DOB: 03/26/2001  Hector Gross is a 23 y.o. year old male who sees Collins Dean, NP for primary care. I reached out to Jud Norris by phone today to offer complex care management services.  [name] was given information about Complex Care Management services today including:   The Complex Care Management services include support from the care team which includes your Nurse Care Manager, Clinical Social Worker, or Pharmacist.  The Complex Care Management team is here to help remove barriers to the health concerns and goals most important to you. Complex Care Management services are voluntary, and the patient may decline or stop services at any time by request to their care team member.   Complex Care Management Consent Status: Patient agreed to services and verbal consent obtained.   Follow up plan:  Telephone appointment with complex care management team member scheduled for:  03/17/2024  Encounter Outcome:  Patient Scheduled  Lenton Rail , RMA     [place]  [facility], [facility] Guide  Direct Dial: [phone]  Website: [email or web address]

## 2024-03-17 ENCOUNTER — Telehealth: Payer: Self-pay

## 2024-03-17 ENCOUNTER — Other Ambulatory Visit: Payer: Self-pay

## 2024-03-24 ENCOUNTER — Telehealth: Payer: Self-pay | Admitting: *Deleted

## 2024-03-24 NOTE — Progress Notes (Unsigned)
 Complex Care Management Care Guide Note  03/24/2024 Name: Hector Gross MRN: [id number] DOB: Jan 02, 2001  Hector Gross is a 23 y.o. year old male who is a primary care patient of Collins Dean, NP and is actively engaged with the care management team. I reached out to Jud Norris by phone today to assist with re-scheduling  with the RN Case Manager.  Follow up plan: Unsuccessful telephone outreach attempt made. A HIPAA compliant phone message was left for the patient providing contact information and requesting a return call.  Barnie Bora  [facility]  Value[facility]Based [facility], [facility] Guide  Direct Dial: [phone]  Fax [phone]

## 2024-03-25 NOTE — Progress Notes (Signed)
 Complex Care Management Care Guide Note  03/25/2024 Name: Hector Gross MRN: [id number] DOB: Nov 16, 2000  Hector Gross is a 23 y.o. year old male who is a primary care patient of Collins Dean, NP and is actively engaged with the care management team. I reached out to Jud Norris by phone today to assist with re-scheduling  with the RN Case Manager.  Follow up plan: Unsuccessful telephone outreach attempt made. A HIPAA compliant phone message was left for the patient providing contact information and requesting a return call. No further outreach attempts will be made at this time. We have been unable to contact the patient to reschedule for complex care management services.   Barnie Bora  [facility]  Value-Based Care Institute, [facility] Guide  Direct Dial: [phone]  Fax [phone]

## 2024-03-28 ENCOUNTER — Encounter (HOSPITAL_COMMUNITY): Payer: Self-pay | Admitting: *Deleted

## 2024-03-28 ENCOUNTER — Other Ambulatory Visit: Payer: Self-pay

## 2024-03-28 ENCOUNTER — Emergency Department (HOSPITAL_COMMUNITY)
Admission: EM | Admit: 2024-03-28 | Discharge: 2024-03-28 | Disposition: A | Discharge status: Home / Self Care | Attending: Emergency Medicine | Admitting: Emergency Medicine

## 2024-03-28 DIAGNOSIS — J4521 Mild intermittent asthma with (acute) exacerbation: Secondary | ICD-10-CM | POA: Diagnosis not present

## 2024-03-28 DIAGNOSIS — R059 Cough, unspecified: Secondary | ICD-10-CM | POA: Diagnosis present

## 2024-03-28 MED ORDER — ALBUTEROL SULFATE HFA 108 (90 BASE) MCG/ACT IN AERS
1.0000 | INHALATION_SPRAY | Freq: Once | RESPIRATORY_TRACT | Status: AC
  Filled 2024-03-28: qty 6.7

## 2024-03-28 MED ORDER — PREDNISONE 20 MG PO TABS
40.0000 mg | ORAL_TABLET | Freq: Once | ORAL | Status: AC
  Administered 2024-03-28: 40 mg via ORAL
  Filled 2024-03-28: qty 2

## 2024-03-28 MED ORDER — PREDNISONE 10 MG PO TABS: 40.0000 mg | ORAL_TABLET | Freq: Every day | ORAL | 0 refills | Status: DC

## 2024-03-28 MED ORDER — IPRATROPIUM-ALBUTEROL 0.5-2.5 (3) MG/3ML IN SOLN
3.0000 mL | RESPIRATORY_TRACT | Status: AC
  Filled 2024-03-28: qty 3
  Filled 2024-03-28: qty 6

## 2024-03-28 NOTE — ED Triage Notes (Signed)
 The pt also has a cold making his asthma worse

## 2024-03-28 NOTE — ED Provider Notes (Signed)
 Patient here with asthma symptoms.  Has been without his inhaler.  He appears well.  Normal vitals.  Has had some nasal congestion viral symptoms.  No fever no cough or sputum production otherwise.  She is got mild wheezing on exam.  I suspect that this is a mild asthma exacerbation.  He is given breathing treatment steroids here.  Will continue the same at home.  Have no concern for pneumonia or other acute process at this time.  No concern for ACS PE.  Discharged in good condition.  Understands return precautions.  This chart was dictated using voice recognition software.  Despite best efforts to proofread,  errors can occur which can change the documentation meaning.    Lowery Rue, DO 03/28/24 2006

## 2024-03-28 NOTE — ED Provider Notes (Signed)
  [place] EMERGENCY DEPARTMENT AT [facility] Provider Note   CSN: [id number] Arrival date & time: 03/28/24  1816     History Chief Complaint  Patient presents with   Asthma    Hector Gross is a 23 y.o. male w/ PMHx asthma, Marijuana use,  who presents to the ED for evaluation of asthma exacerbation.  Patient reports he has had cough congestion for the past few days.  Patient denies any fevers.  No chest pain.  Patient reports he ran out of inhaler.       Physical Exam Updated Vital Signs BP 123/76 (BP Location: Right Arm)   Pulse 94   Resp (!) 22   Ht 6' (1.829 m)   Wt 97 kg   SpO2 95%   BMI 29.00 kg/m  Physical Exam Vitals and nursing note reviewed.  Constitutional:      General: He is not in acute distress.    Appearance: He is well-developed.   Cardiovascular:     Rate and Rhythm: Normal rate and regular rhythm.     Pulses: Normal pulses.     Heart sounds: Normal heart sounds. No murmur heard. Pulmonary:     Effort: Pulmonary effort is normal. No respiratory distress.     Breath sounds: Wheezing present.   Skin:    General: Skin is warm and dry.     Capillary Refill: Capillary refill takes less than 2 seconds.   Neurological:     Mental Status: He is alert.   Psychiatric:        Mood and Affect: Mood normal.     ED Results / Procedures / Treatments     Medications Ordered in ED Medications  ipratropium-albuterol  (DUONEB) 0.5-2.5 (3) MG/3ML nebulizer solution 3 mL (has no administration in time range)  predniSONE  (DELTASONE ) tablet 40 mg (has no administration in time range)  albuterol  (VENTOLIN  HFA) 108 (90 Base) MCG/ACT inhaler 1 puff (has no administration in time range)    ED Course/ Medical Decision Making/ A&P  Hector Gross is a 23 y.o. male presents as detailed above  Differential ddx: Asthma exacerbation, URI.  On arrival, patient afebrile hemodynamically stable no hypoxia or respiratory distress.  No rales on exam  only mild wheezing.  No respiratory distress.  No concern for pneumonia or systemic illness.  No suspicion for PE.  No concern for acute life-threatening illness or injury.  Patient likely with asthma exacerbation secondary to URI.  Patient received DuoNeb treatments followed by Ventolin  inhaler and prednisone .  Will discharge with albuterol  inhaler and short course of prednisone .    Patient stable for discharge and outpatient follow-up. Strict return precautions provided. Patient voices understanding and agrees with plan.   Patient seen with supervising physician who agrees with plan.  Final Clinical Impression(s) / ED Diagnoses Final diagnoses:  Mild intermittent asthma with exacerbation    Rx / DC Orders ED Discharge Orders          Ordered    predniSONE  (DELTASONE ) 10 MG tablet  Daily        03/28/24 1927            Angele Keller, DO PGY-3 Emergency Medicine    Angele Keller, DO 03/28/24 2306    Lowery Rue, DO 03/28/24 2306

## 2024-03-28 NOTE — Discharge Instructions (Addendum)
 Please use albuterol  inhaler 4 puffs every 4 hours for next 24 hours then every 4 hours as needed.  Please take prednisone  40 mg (2 tablets.  Daily for the next 5 days. Thank you for allowing us  to take care of you today.  We hope you begin feeling better soon.   To-Do:  Please follow-up with your primary doctor within the next 2-3 days. Please return to the Emergency Department or call 911 if you experience chest pain, shortness of breath, severe pain, severe fever, altered mental status, or have any reason to think that you need emergency medical care.  Thank you again.  Hope you feel better soon.  Arlin Benes Department of Emergency Medicine

## 2024-03-28 NOTE — ED Triage Notes (Signed)
 The pt has asthma and he uses dulera  inhaler  he has run out and a delay at  the  pharmacy has caused him not to have the medication .

## 2024-03-30 ENCOUNTER — Ambulatory Visit: Admitting: Nurse Practitioner

## 2024-03-30 ENCOUNTER — Telehealth: Payer: Self-pay | Admitting: *Deleted

## 2024-03-30 NOTE — Progress Notes (Signed)
 Complex Care Management Care Guide Note  03/30/2024 Name: Hector Gross MRN: [id number] DOB: 26-Aug-2001  Hector Gross is a 23 y.o. year old male who is a primary care patient of Default, Provider, MD and is actively engaged with the care management team. I reached out to Hector Gross by phone today to assist with re-scheduling  with the RN Case Manager.  Follow up plan: Unsuccessful telephone outreach attempt made. A HIPAA compliant phone message was left for the patient providing contact information and requesting a return call.  Barnie Bora  [facility]  Value-Based Care Institute, [facility] Guide  Direct Dial: [phone]  Fax [phone]

## 2024-04-01 ENCOUNTER — Telehealth (INDEPENDENT_AMBULATORY_CARE_PROVIDER_SITE_OTHER): Payer: Self-pay | Admitting: Primary Care

## 2024-04-01 NOTE — Telephone Encounter (Signed)
 Left VM with pt about their upcoming appt.

## 2024-04-02 ENCOUNTER — Ambulatory Visit (INDEPENDENT_AMBULATORY_CARE_PROVIDER_SITE_OTHER): Admitting: Primary Care

## 2024-04-02 ENCOUNTER — Encounter (INDEPENDENT_AMBULATORY_CARE_PROVIDER_SITE_OTHER): Payer: Self-pay | Admitting: Primary Care

## 2024-04-02 ENCOUNTER — Other Ambulatory Visit: Payer: Self-pay | Admitting: Nurse Practitioner

## 2024-04-02 VITALS — BP 123/68

## 2024-04-02 DIAGNOSIS — J4541 Moderate persistent asthma with (acute) exacerbation: Secondary | ICD-10-CM | POA: Diagnosis not present

## 2024-04-02 DIAGNOSIS — Z7689 Persons encountering health services in other specified circumstances: Secondary | ICD-10-CM

## 2024-04-02 DIAGNOSIS — Z23 Encounter for immunization: Secondary | ICD-10-CM

## 2024-04-02 DIAGNOSIS — L209 Atopic dermatitis, unspecified: Secondary | ICD-10-CM

## 2024-04-02 DIAGNOSIS — H6123 Impacted cerumen, bilateral: Secondary | ICD-10-CM

## 2024-04-02 DIAGNOSIS — J45901 Unspecified asthma with (acute) exacerbation: Secondary | ICD-10-CM

## 2024-04-02 MED ORDER — PREDNISONE 10 MG PO TABS: 10.0000 mg | ORAL_TABLET | Freq: Every day | ORAL | 0 refills | Status: AC

## 2024-04-02 NOTE — Progress Notes (Signed)
 Subjective:   Hector Gross is a 23 y.o. male sexually active male who presents to establish care with a new provider.  He has given his girlfriend permission to be at her his appointment and offer any information that he may forget.  He was recently in the emergency room intermittent asthma with exacerbation on these following days 03/28/2024, 03/03/2024, exacerbation 03/02/2024 03/02/2024 also 02/29/2024. He has also been without his inhalers due to being denied.  Patient will be referred to pulmonology for tighter control of asthma exacerbations.  Admits to shortness of breath but that is a daily occurrence. Patient has No headache, No chest pain, No abdominal pain - No Nausea, No new weakness tingling or numbness, No Cough  Past Medical History:  Diagnosis Date   Asthma      No Known Allergies  Current Outpatient Medications on File Prior to Visit  Medication Sig Dispense Refill   albuterol  (VENTOLIN  HFA) 108 (90 Base) MCG/ACT inhaler Inhale 1-2 puffs into the lungs every 6 (six) hours as needed for wheezing or shortness of breath. 18 g 0   mometasone -formoterol  (DULERA ) 200-5 MCG/ACT AERO Inhale 2 puffs into the lungs 2 (two) times daily. 13 g 1   montelukast  (SINGULAIR ) 10 MG tablet Take 1 tablet (10 mg total) by mouth at bedtime. 30 tablet 0   ondansetron  (ZOFRAN ) 4 MG tablet Take 1 tablet (4 mg total) by mouth every 8 (eight) hours as needed for nausea or vomiting. 20 tablet 0   triamcinolone  cream (KENALOG ) 0.1 % Apply 1 Application topically 2 (two) times daily. (Patient taking differently: Apply 1 Application topically daily as needed (for itching).) 60 g 0   No current facility-administered medications on file prior to visit.    Review of System: ROS Comprehensive ROS Pertinent positive and negative noted in HPI   Objective:  There were no vitals taken for this visit.  There were no vitals filed for this visit.  Physical Exam Vitals reviewed.  Constitutional:       Appearance: Normal appearance. He is normal weight.  HENT:     Head: Normocephalic.     Right Ear: External ear normal. There is impacted cerumen.     Left Ear: External ear normal. There is impacted cerumen.     Nose: Nose normal.   Eyes:     Extraocular Movements: Extraocular movements intact.     Pupils: Pupils are equal, round, and reactive to light.    Cardiovascular:     Rate and Rhythm: Normal rate and regular rhythm.  Pulmonary:     Breath sounds: Wheezing present.  Abdominal:     General: Bowel sounds are normal. There is distension.     Palpations: Abdomen is soft.   Musculoskeletal:        General: Normal range of motion.     Cervical back: Normal range of motion.   Skin:    General: Skin is warm and dry.   Neurological:     Mental Status: He is alert and oriented to person, place, and time.   Psychiatric:        Mood and Affect: Mood normal.        Behavior: Behavior normal.        Thought Content: Thought content normal.        Judgment: Judgment normal.      Assessment:  Kodah was seen today for asthma.  Diagnoses and all orders for this visit:  Encounter to establish care  Need for  HPV vaccine  Asthma, chronic, unspecified asthma severity, with acute exacerbation -     Ambulatory referral to Pulmonology -     Ambulatory referral to Allergy -     predniSONE  (DELTASONE ) 10 MG tablet; Take 1 tablet (10 mg total) by mouth daily with breakfast.  Moderate persistent asthma with exacerbation -     Ambulatory referral to Pulmonology -     Ambulatory referral to Allergy -     predniSONE  (DELTASONE ) 10 MG tablet; Take 1 tablet (10 mg total) by mouth daily with breakfast.  Bilateral impacted cerumen Schedule ear irrigation      This note has been created with Education officer, environmental. Any transcriptional errors are unintentional.   Return in about 1 month (around 05/02/2024).  Marius Siemens,  NP 04/02/2024, 5:42 PM

## 2024-04-03 NOTE — Telephone Encounter (Signed)
 Will forward to provider

## 2024-04-06 NOTE — Progress Notes (Signed)
 Complex Care Management Care Guide Note  04/06/2024 Name: Hector Gross MRN: [id number] DOB: [birth year]  Hector Gross is a 23 y.o. year old male who is a primary care patient of Default, Provider, MD and is actively engaged with the care management team. I reached out to Hector Gross by phone today to assist with re-scheduling  with the RN Case Manager.  Follow up plan: Telephone appointment with complex care management team member scheduled for:  04/22/24  Harlene Satterfield  [facility]  [facility] Guide  Direct Dial: [phone]  Fax [phone]

## 2024-04-10 MED ORDER — TRIAMCINOLONE ACETONIDE 0.1 % EX CREA
1.0000 | TOPICAL_CREAM | Freq: Two times a day (BID) | CUTANEOUS | 0 refills | Status: AC
  Filled 2024-04-10 – 2024-09-22 (×3): qty 60, 30d supply, fill #0

## 2024-04-11 ENCOUNTER — Other Ambulatory Visit: Payer: Self-pay

## 2024-04-13 ENCOUNTER — Other Ambulatory Visit: Payer: Self-pay

## 2024-04-22 ENCOUNTER — Telehealth: Payer: Self-pay

## 2024-04-22 NOTE — Patient Outreach (Signed)
 Care Coordination   04/22/2024 Name: Hector Gross MRN: [id number] DOB: 2000/10/18   Care Coordination Outreach Attempts:  A second unsuccessful outreach was attempted today to complete initial CCM visit.  Follow Up Plan:  Additional outreach attempts will be made to reschedule initial visit.  Encounter Outcome:  No Answer. HIPAA compliant voicemail left.   Rosaline Finlay, RN MSN [place]  [facility] RN Care Manager Direct Dial: [phone]  Fax: [phone]

## 2024-04-24 ENCOUNTER — Other Ambulatory Visit: Payer: Self-pay

## 2024-05-05 ENCOUNTER — Other Ambulatory Visit (INDEPENDENT_AMBULATORY_CARE_PROVIDER_SITE_OTHER): Payer: Self-pay

## 2024-05-05 ENCOUNTER — Encounter (INDEPENDENT_AMBULATORY_CARE_PROVIDER_SITE_OTHER): Payer: Self-pay | Admitting: Primary Care

## 2024-05-05 DIAGNOSIS — J4541 Moderate persistent asthma with (acute) exacerbation: Secondary | ICD-10-CM

## 2024-05-05 NOTE — Telephone Encounter (Signed)
 Will forward to provider Last seen 04/02/24

## 2024-05-06 MED ORDER — DULERA 200-5 MCG/ACT IN AERO: 2.0000 | INHALATION_SPRAY | Freq: Two times a day (BID) | RESPIRATORY_TRACT | 3 refills | Status: DC

## 2024-05-29 ENCOUNTER — Other Ambulatory Visit: Payer: Self-pay | Admitting: Pulmonary Disease

## 2024-05-29 ENCOUNTER — Emergency Department (HOSPITAL_COMMUNITY)

## 2024-05-29 ENCOUNTER — Telehealth (INDEPENDENT_AMBULATORY_CARE_PROVIDER_SITE_OTHER): Payer: Self-pay | Admitting: Primary Care

## 2024-05-29 ENCOUNTER — Other Ambulatory Visit: Payer: Self-pay

## 2024-05-29 ENCOUNTER — Encounter (HOSPITAL_COMMUNITY): Payer: Self-pay

## 2024-05-29 ENCOUNTER — Emergency Department (HOSPITAL_COMMUNITY)
Admission: EM | Admit: 2024-05-29 | Discharge: 2024-05-29 | Disposition: A | Discharge status: Home / Self Care | Attending: Emergency Medicine | Admitting: Emergency Medicine

## 2024-05-29 ENCOUNTER — Telehealth: Admitting: Physician Assistant

## 2024-05-29 ENCOUNTER — Ambulatory Visit: Payer: Self-pay

## 2024-05-29 ENCOUNTER — Other Ambulatory Visit (INDEPENDENT_AMBULATORY_CARE_PROVIDER_SITE_OTHER): Payer: Self-pay | Admitting: Primary Care

## 2024-05-29 ENCOUNTER — Telehealth: Payer: Self-pay | Admitting: Acute Care

## 2024-05-29 DIAGNOSIS — J45901 Unspecified asthma with (acute) exacerbation: Secondary | ICD-10-CM | POA: Insufficient documentation

## 2024-05-29 DIAGNOSIS — J4541 Moderate persistent asthma with (acute) exacerbation: Secondary | ICD-10-CM

## 2024-05-29 DIAGNOSIS — R9389 Abnormal findings on diagnostic imaging of other specified body structures: Secondary | ICD-10-CM | POA: Diagnosis not present

## 2024-05-29 DIAGNOSIS — R918 Other nonspecific abnormal finding of lung field: Secondary | ICD-10-CM | POA: Diagnosis not present

## 2024-05-29 DIAGNOSIS — B449 Aspergillosis, unspecified: Secondary | ICD-10-CM | POA: Diagnosis not present

## 2024-05-29 DIAGNOSIS — J479 Bronchiectasis, uncomplicated: Secondary | ICD-10-CM | POA: Diagnosis not present

## 2024-05-29 DIAGNOSIS — Z5329 Procedure and treatment not carried out because of patient's decision for other reasons: Secondary | ICD-10-CM | POA: Diagnosis not present

## 2024-05-29 DIAGNOSIS — R0602 Shortness of breath: Secondary | ICD-10-CM | POA: Diagnosis not present

## 2024-05-29 LAB — I-STAT CHEM 8, ED
BUN: 13 mg/dL (ref 6–20)
Calcium, Ion: 1.2 mmol/L (ref 1.15–1.40)
Chloride: 105 mmol/L (ref 98–111)
Creatinine, Ser: 1.2 mg/dL (ref 0.61–1.24)
Glucose, Bld: 99 mg/dL (ref 70–99)
HCT: 42 % (ref 39.0–52.0)
Hemoglobin: 14.3 g/dL (ref 13.0–17.0)
Potassium: 4.1 mmol/L (ref 3.5–5.1)
Sodium: 141 mmol/L (ref 135–145)
TCO2: 24 mmol/L (ref 22–32)

## 2024-05-29 MED ORDER — ALBUTEROL SULFATE HFA 108 (90 BASE) MCG/ACT IN AERS
2.0000 | INHALATION_SPRAY | RESPIRATORY_TRACT | Status: DC | PRN
  Administered 2024-05-29: 2 via RESPIRATORY_TRACT
  Filled 2024-05-29: qty 6.7

## 2024-05-29 MED ORDER — DEXAMETHASONE SODIUM PHOSPHATE 10 MG/ML IJ SOLN
10.0000 mg | Freq: Once | INTRAMUSCULAR | Status: AC
  Administered 2024-05-29: 10 mg via INTRAMUSCULAR
  Filled 2024-05-29: qty 1

## 2024-05-29 MED ORDER — IOHEXOL 350 MG/ML SOLN
75.0000 mL | Freq: Once | INTRAVENOUS | Status: AC | PRN
  Administered 2024-05-29: 75 mL via INTRAVENOUS

## 2024-05-29 MED ORDER — IPRATROPIUM-ALBUTEROL 0.5-2.5 (3) MG/3ML IN SOLN
3.0000 mL | RESPIRATORY_TRACT | Status: AC
  Administered 2024-05-29 (×3): 3 mL via RESPIRATORY_TRACT
  Filled 2024-05-29: qty 9

## 2024-05-29 NOTE — Telephone Encounter (Signed)
 FYI Only or Action Required?: Action required by provider: medication refill request.  Patient was last seen in primary care on 04/02/2024 by Celestia Rosaline SQUIBB, NP.  Called Nurse Triage reporting Chest Pain and Shortness of Breath.  Symptoms began ongoing.  Interventions attempted: Other: Went to ED.  Symptoms are: gradually worsening.  Triage Disposition: See HCP Within 4 Hours (Or PCP Triage)  Patient/caregiver understands and will follow disposition?: Unsure              Copied from CRM #[id number]. Topic: Clinical - Red Word Triage >> May 29, 2024  4:47 PM Turkey B wrote: [facility] that prompted transfer to Nurse Triage: pt has chest pain, says when  he first wakes up with chest pain mother on the line, pt is sleep Reason for Disposition  [1] MILD difficulty breathing (e.g., minimal/no SOB at rest, SOB with walking, pulse < 100) AND [2] NEW-onset or WORSE than normal  Answer Assessment - Initial Assessment Questions 1. LOCATION: Where does it hurt?       Pt is having trouble breathing d/t not having inhaler. 3. ONSET: When did the chest pain begin? (Minutes, hours or days)      Ongoing 4. PATTERN: Does the pain come and go, or has it been constant since it started?  Does it get worse with exertion?      Comes and goes 8. PULMONARY RISK FACTORS: Do you have any history of lung disease?  (e.g., blood clots in lung, asthma, emphysema, birth control pills)     Asthma 9. CAUSE: What do you think is causing the chest pain?     Out of medications  Answer Assessment - Initial Assessment Questions Call from pt's mother. Pt is out of asthma medication and is having chest pain/difficulty breathing. Inhalers have been prescribed by other providers, not Rosaline Celestia. No refill request had been made to [employer]. Placed refill request.  Pt seen in ED today, Pt was given 1 inhaler treatment. Pt left after that.  Advised UC to get inhaler refilled. Mother  requested to speak to my supervisor. Call taken over by [facility].      1. RESPIRATORY STATUS: Describe your breathing? (e.g., wheezing, shortness of breath, unable to speak, severe coughing)      Chest pain/sob 2. ONSET: When did this breathing problem begin?      ongoing 3. PATTERN Does the difficult breathing come and go, or has it been constant since it started?      yes  7. LUNG HISTORY: Do you have any history of lung disease?  (e.g., pulmonary embolus, asthma, emphysema)     asthma 8. CAUSE: What do you think is causing the breathing problem?      PT does not have inhaler  Protocols used: Chest Pain-A-AH, Breathing Difficulty-A-AH

## 2024-05-29 NOTE — Progress Notes (Signed)
   Thank you for the details you included in the comment boxes. Those details are very helpful in determining the best course of treatment for you and help us  to provide the best care. Because of requesting to speak with someone about your asthma, we recommend that you schedule a Virtual Urgent Care video visit in order for the provider to better assess what is going on.  The provider will be able to give you a more accurate diagnosis and treatment plan if we can more freely discuss your symptoms and with the addition of a virtual examination.   If you change your visit to a video visit, we will bill your insurance (similar to an office visit) and you will not be charged for this e-Visit. You will be able to stay at home and speak with the first available [facility] advanced practice provider. The link to do a video visit is in the drop down Menu tab of your Welcome screen in MyChart.       I have spent 5 minutes in review of e-visit questionnaire, review and updating patient chart, medical decision making and response to patient.   Delon CHRISTELLA Dickinson, PA-C

## 2024-05-29 NOTE — ED Notes (Signed)
 Pt requesting to leave AMA. Pt informed on risks of leaving and benefits to staying. Pt still requesting to leave. PA Smoot aware and spoke with pt. Pt still requesting to leave. Pt signed AMA form digitally.

## 2024-05-29 NOTE — ED Provider Notes (Signed)
 Hector Gross Provider Note   CSN: [id number] Arrival date & time: 05/29/24  9295     Patient presents with: Shortness of Breath   Hector Gross is a 23 y.o. male.   Patient with history of asthma presents today with complaints of shortness of breath.  Reports that he uses his inhaler every day for management of his asthma.  Realized yesterday that he was out of his prescription.  Reports he tried to message his PCP but did not hear back.  Today he needs a breathing treatment.  Presents for same.  Denies any fevers, chills, cough, congestion.  No chest pain.  No leg pain or leg swelling, recent travel or recent surgeries.  Reports it feels like asthma exacerbations he has had previously.  The history is provided by the patient. No language interpreter was used.  Shortness of Breath Associated symptoms: wheezing        Prior to Admission medications   Medication Sig Start Date End Date Taking? Authorizing Provider  albuterol  (VENTOLIN  HFA) 108 (90 Base) MCG/ACT inhaler Inhale 1-2 puffs into the lungs every 6 (six) hours as needed for wheezing or shortness of breath. 02/29/24   Hector Nidia FALCON, PA-C  mometasone -formoterol  (DULERA ) 200-5 MCG/ACT AERO Inhale 2 puffs into the lungs 2 (two) times daily. 05/06/24   Hector Rosaline SQUIBB, NP  montelukast  (SINGULAIR ) 10 MG tablet Take 1 tablet (10 mg total) by mouth at bedtime. 09/24/23   Gross, Hector W, NP  ondansetron  (ZOFRAN ) 4 MG tablet Take 1 tablet (4 mg total) by mouth every 8 (eight) hours as needed for nausea or vomiting. 07/15/23   Hector Elgin LABOR, MD  triamcinolone  cream (KENALOG ) 0.1 % Apply 1 Application topically 2 (two) times daily. 04/10/24   Hector Rosaline SQUIBB, NP    Allergies: Patient has no known allergies.    Review of Systems  Respiratory:  Positive for shortness of breath and wheezing.   All other systems reviewed and are negative.   Updated Vital Signs BP 123/79 (BP  Location: Right Arm)   Pulse 66   Temp 97.7 F (36.5 C) (Oral)   Resp 16   Ht 6' (1.829 m)   Wt 97.5 kg   SpO2 92%   BMI 29.16 kg/m   Physical Exam Vitals and nursing note reviewed.  Constitutional:      General: He is not in acute distress.    Appearance: Normal appearance. He is normal weight. He is not ill-appearing, toxic-appearing or diaphoretic.  HENT:     Head: Normocephalic and atraumatic.  Cardiovascular:     Rate and Rhythm: Normal rate and regular rhythm.     Heart sounds: Normal heart sounds.  Pulmonary:     Effort: Pulmonary effort is normal. No respiratory distress.     Comments: Mild expiratory wheezing throughout Abdominal:     Palpations: Abdomen is soft.     Tenderness: There is no abdominal tenderness.  Musculoskeletal:        General: Normal range of motion.     Cervical back: Normal range of motion.     Right lower leg: No tenderness. No edema.     Left lower leg: No tenderness. No edema.  Skin:    General: Skin is warm and dry.  Neurological:     General: No focal deficit present.     Mental Status: He is alert.  Psychiatric:        Mood and Affect: Mood normal.  Behavior: Behavior normal.     (all labs ordered are listed, but only abnormal results are displayed) Labs Reviewed  ASPERGILLUS ANTIGEN, BAL/SERUM  IGE  FUNGITELL BETA-D-GLUCAN  ASPERGILLUS ANTIBODY BY IMMUNODIFF  I-STAT CHEM 8, ED    EKG: None  Radiology: CT Chest W Contrast Result Date: 05/29/2024 CLINICAL DATA:  Shortness of breath. Possible cavitary nodule on same day chest radiograph EXAM: CT CHEST WITH CONTRAST TECHNIQUE: Multidetector CT imaging of the chest was performed during intravenous contrast administration. RADIATION DOSE REDUCTION: This exam was performed according to the departmental dose-optimization program which includes automated exposure control, adjustment of the mA and/or kV according to patient size and/or use of iterative reconstruction technique.  CONTRAST:  75mL OMNIPAQUE  IOHEXOL  350 MG/ML SOLN COMPARISON:  Same day chest radiograph FINDINGS: Cardiovascular: Normal heart size. No significant pericardial fluid/thickening. Great vessels are normal in course and caliber. No central pulmonary emboli. Mediastinum/Nodes: Imaged thyroid gland without nodules meeting criteria for imaging follow-up by size. Normal esophagus. No pathologically enlarged axillary, supraclavicular, mediastinal, or hilar lymph nodes. Lungs/Pleura: The central airways are patent. Mild diffuse bronchiectasis. Posterior left upper lobe cavitary nodule measures 11 x 10 mm (4:46). Additional solid left upper lobe pulmonary nodules measuring up to 6 x 5 mm (4:18, 54, 58). Scattered ground-glass nodules are also seen, for example in the right upper lobe (4: 45, 46). No pneumothorax. No pleural effusion. Upper abdomen: Normal. Musculoskeletal: No acute or abnormal lytic or blastic osseous lesions. IMPRESSION: 1. 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 2. Mild diffuse bronchiectasis, which may be seen in the setting of reported asthma. Electronically Signed   By: Hector  Gross M.D.   On: 05/29/2024 11:22   DG Chest 2 View Result Date: 05/29/2024 CLINICAL DATA:  Shortness of breath. EXAM: CHEST - 2 VIEW COMPARISON:  Mar 02, 2024. FINDINGS: The heart size and mediastinal contours are within normal limits. Right lung is clear. Interval development of ill-defined nodular density seen in left upper lobe. The visualized skeletal structures are unremarkable. IMPRESSION: Interval development of ill-defined nodular density seen in left upper lobe which potentially may be cavitary. CT scan of the chest is recommended for further evaluation. Electronically Signed   By: Hector Gross M.D.   On: 05/29/2024 08:27     Procedures   Medications  Ordered in the ED  albuterol  (VENTOLIN  HFA) 108 (90 Base) MCG/ACT inhaler 2 puff (2 puffs Inhalation Given 05/29/24 0714)  ipratropium-albuterol  (DUONEB) 0.5-2.5 (3) MG/3ML nebulizer solution 3 mL (has no administration in time range)  dexamethasone  (DECADRON ) injection 10 mg (has no administration in time range)                                    Medical Decision Making Amount and/or Complexity of Data Reviewed Labs: ordered. Radiology: ordered.  Risk Prescription drug management.   This patient is a 23 y.o. male who presents to the ED for concern of shortness of breath, this involves an extensive number of treatment options, and is a complaint that carries with it a high risk of complications and morbidity. The emergent differential diagnosis prior to evaluation includes, but is not limited to,  CHF, pericardial effusion/tamponade, arrhythmias, ACS, COPD, asthma, bronchitis, pneumonia, pneumothorax, PE, anemia  This is not an exhaustive differential.   Past Medical History / Co-morbidities /  Social History:  has a past medical history of Asthma.  Additional history: Chart reviewed.  Physical Exam: Physical exam performed. The pertinent findings include: Mild expiratory wheezing throughout, speaking in complete sentences on room air in no acute distress  Lab Tests: I ordered, and personally interpreted labs.  The pertinent results include:  no acute laboratory abnormalities   Imaging Studies: I ordered imaging studies including CXR, CT chest. I independently visualized and interpreted imaging which showed   CXR: Interval development of ill-defined nodular density seen in left upper lobe which potentially may be cavitary. CT scan of the chest is recommended for further evaluation.  CT chest:   1. Posterior left upper lobe cavitary nodule and additional solid and ground-glass nodules, likely infectious/inflammatory, including fungal etiology and specifically, aspergillosis  in the setting of asthma. 2. Mild diffuse bronchiectasis, which may be seen in the setting of reported asthma.  I agree with the radiologist interpretation.   Cardiac Monitoring:  The patient was maintained on a cardiac monitor.  Cardiac monitored which showed an underlying rhythm of: sinus rhythm, no STEMI. I agree with this interpretation.   Medications: I ordered medication including duoneb x 3, decadron   for asthma. Reevaluation of the patient after these medicines showed that the patient improved. I have reviewed the patients home medicines and have made adjustments as needed.  Consultations Obtained: I requested consultation with the pulmonology on call Benton Lesches, NP,  and discussed lab and imaging findings as well as pertinent plan - they recommend: they discussed with pulmonology Dr. Neda who reports concern for Aspergillus infection with aspergilloma vs allergic bronchopulmonary aspergillosis. Recommends obtaining IgE level, Aspergillus specific IgE and IgG, Aspergillus precipitins, galactomannan, fungitell- for invasive fungal disease, Follow-up in the office with possibly Dr. Shelah because of the nodular lesions. If blood work not revealing/negative then yes bronchoscopy will be considered and can be done as outpatient. No indication for admission.  Disposition:  Just got off the phone with pulmonology who had above recommendations, nursing staff informed me that the patient had taken out his own IV and signed out AGAINST MEDICAL ADVICE. I had discussed his CT findings with him. We had discussed the nature and purpose, risks and benefits, as well as, the alternatives of treatment. Time was given to allow the opportunity to ask questions and consider their options, and after the discussion, the patient decided to refuse the offerred treatment. The patient was informed that refusal could lead to, but was not limited to, death, permanent disability, or severe pain. Prior to refusing,  I determined that the patient had the capacity to make their decision and understood the consequences of that decision. After refusal, I made every reasonable opportunity to treat them to the best of my ability.  The patient was notified that they may return to the emergency department at any time for further treatment.    After getting pulmonology recommendations, I did call him on the phone, discussed recommendations for additional labs and then outpatient pulmonology follow-up.  He reported to me that he was still waiting for a ride in the lobby and would stay to have these labs drawn.  I discussed with the charge nurse who will pull them back into the system, will pull into a triage note room and further discussed the plan.  Nursing staff reportedly went to the lobby to find the patient and he was not present. Discussed this with pulmonology who will call him to set up an outpatient appointment. I also sent  a message to his listed pcp Rosaline Bohr to help facilitate outpatient follow-up.  I discussed this case with my attending physician Dr. Levander who cosigned this note including patient's presenting symptoms, physical exam, and planned diagnostics and interventions. Attending physician stated agreement with plan or made changes to plan which were implemented.    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

## 2024-05-29 NOTE — ED Notes (Signed)
 I called the pt to be brought back to have labs drawn and pt can't be found in lobby. Sort staff advised pt continues to walk in and out of lobby.

## 2024-05-29 NOTE — Telephone Encounter (Signed)
 Patient currently in ED.

## 2024-05-29 NOTE — Telephone Encounter (Signed)
 Patient's mom Milinda called and the call was transferred to me to speak to her on the supervisor level. She says that she's not understanding why he can't get his inhaler sent in by [facility]. Advised Dulera  was sent in and has refills on it at [facility]. She says they told her it will be ready tomorrow, but says what is he to do in the meantime. Advised he was in the ED today and if he would've stayed in the ED, the provider would've given Rx for all that is need to cover until he's seen in the office by his provider. She says if they give a Rx then the pharmacy will not have it ready until the next day, so what should he do at night when he needs it. Advised we don't have anything to do with when the pharmacy refills it. She says they will say they need the doctor to sign off on it. Advised sometimes it is the insurance requiring the provider to provide documentation on why the patient needs this particular medication that costs more than a cheaper one that is not as expensive and it's a process that this will take place, days of waiting, if that is the case. Advised with these refills requested that the Albuterol  inhaler was last prescribed by an ED provider in May, so Rosaline would have to approve that refill. Advised since the office is closed, she will not be able to look at the request until Monday when the office opens back up and then she will choose if she will refill or not. Advised when patients are seen routinely as requested by the providers, then the medications that need to refilled are refilled by the provider. Advised if there are missed appointments then the provider can decided if they will refill or not due to non-compliance of the patient being seen. She says so they don't always approve the refills. I advised that it can be refused based on the discretion of the provider and that it can be a legality issue if the provider is refilling for a patient and that patient is not being seen. She  stated I understand. She says so what will need to be done for him. Advised I could call the on call provider to see what the recommendation is. She says the recommendation would be to go to the ED then we are still in the same situation. Advised unfortunately, that he will need to be evaluated in the ED if he's having SOB or CP. She asked if there are any appointments on Monday with Rosaline. Advised the first available that I see is on 06/18/24. She says to schedule that and she will make sure he comes to that visit. Advised ED for worsening symptoms. She verbalized understanding.

## 2024-05-29 NOTE — ED Triage Notes (Signed)
 Pt states he feels like he is having an asthma attack. Pt c/o SHOB since yesterday and is out of his albuterol  inhaler.

## 2024-05-29 NOTE — Telephone Encounter (Signed)
 Called pt to see if interested in scheduling an appt with [facility]. If pt does call back please advise.

## 2024-05-30 ENCOUNTER — Emergency Department (HOSPITAL_BASED_OUTPATIENT_CLINIC_OR_DEPARTMENT_OTHER)
Admission: EM | Admit: 2024-05-30 | Discharge: 2024-05-30 | Disposition: A | Discharge status: Home / Self Care | Attending: Emergency Medicine | Admitting: Emergency Medicine

## 2024-05-30 ENCOUNTER — Encounter (HOSPITAL_BASED_OUTPATIENT_CLINIC_OR_DEPARTMENT_OTHER): Payer: Self-pay | Admitting: Emergency Medicine

## 2024-05-30 ENCOUNTER — Other Ambulatory Visit (HOSPITAL_BASED_OUTPATIENT_CLINIC_OR_DEPARTMENT_OTHER): Payer: Self-pay

## 2024-05-30 DIAGNOSIS — R112 Nausea with vomiting, unspecified: Secondary | ICD-10-CM | POA: Diagnosis not present

## 2024-05-30 DIAGNOSIS — B441 Other pulmonary aspergillosis: Secondary | ICD-10-CM | POA: Insufficient documentation

## 2024-05-30 DIAGNOSIS — R069 Unspecified abnormalities of breathing: Secondary | ICD-10-CM | POA: Diagnosis not present

## 2024-05-30 DIAGNOSIS — Z91128 Patient's intentional underdosing of medication regimen for other reason: Secondary | ICD-10-CM | POA: Diagnosis not present

## 2024-05-30 DIAGNOSIS — R0602 Shortness of breath: Secondary | ICD-10-CM | POA: Diagnosis not present

## 2024-05-30 DIAGNOSIS — R9389 Abnormal findings on diagnostic imaging of other specified body structures: Secondary | ICD-10-CM | POA: Diagnosis not present

## 2024-05-30 DIAGNOSIS — R911 Solitary pulmonary nodule: Secondary | ICD-10-CM | POA: Diagnosis not present

## 2024-05-30 DIAGNOSIS — Z789 Other specified health status: Secondary | ICD-10-CM

## 2024-05-30 DIAGNOSIS — J45909 Unspecified asthma, uncomplicated: Secondary | ICD-10-CM | POA: Diagnosis not present

## 2024-05-30 DIAGNOSIS — J479 Bronchiectasis, uncomplicated: Secondary | ICD-10-CM | POA: Diagnosis not present

## 2024-05-30 DIAGNOSIS — Z760 Encounter for issue of repeat prescription: Secondary | ICD-10-CM | POA: Insufficient documentation

## 2024-05-30 DIAGNOSIS — R062 Wheezing: Secondary | ICD-10-CM | POA: Diagnosis not present

## 2024-05-30 MED ORDER — AEROCHAMBER PLUS FLO-VU MEDIUM MISC
1.0000 | Freq: Once | Status: AC
  Administered 2024-05-30: 1

## 2024-05-30 MED ORDER — MOMETASONE FURO-FORMOTEROL FUM 100-5 MCG/ACT IN AERO
2.0000 | INHALATION_SPRAY | Freq: Two times a day (BID) | RESPIRATORY_TRACT | 1 refills | Status: DC
  Filled 2024-05-30: qty 13, 30d supply, fill #0

## 2024-05-30 MED ORDER — MOMETASONE FURO-FORMOTEROL FUM 100-5 MCG/ACT IN AERO: 2.0000 | INHALATION_SPRAY | Freq: Two times a day (BID) | RESPIRATORY_TRACT | 1 refills | Status: DC

## 2024-05-30 MED ORDER — ALBUTEROL SULFATE HFA 108 (90 BASE) MCG/ACT IN AERS
2.0000 | INHALATION_SPRAY | RESPIRATORY_TRACT | Status: DC | PRN
  Administered 2024-05-30: 2 via RESPIRATORY_TRACT
  Filled 2024-05-30: qty 6.7

## 2024-05-30 NOTE — Discharge Instructions (Addendum)
 Please use inhalers as discussed.  Return if you are having new or worsening symptoms especially fever or productive cough. Please follow-up with pulmonary as previously discussed and with the appointment you have. Please stop smoking as discussed.

## 2024-05-30 NOTE — ED Triage Notes (Signed)
 Pt arrived via [facility] from home c/o SOB x2 days, PMH asthma, tx at [facility] ED yest for aspergillosis. Pt caox4, no obvious distress or labored breathing on arrival to ED.   EMS -  1 albuterol  neb with FD 5mg   Duoneb x2 with EMS (10mg /1mg ) IV mag 2 g  BP 146/88 HR 74 SpO2 100% on 8L neb Temp 98 F

## 2024-05-30 NOTE — ED Provider Notes (Signed)
 [place] EMERGENCY DEPARTMENT AT [facility] Provider Note   CSN: [id number] Arrival date & time: 05/30/24  9287     Patient presents with: Shortness of Breath   Hector Gross is a 23 y.o. male.   HPI 23 year old male history of asthma who has been out of his inhalers for several days.  He reports that he usually uses his albuterol  at least once a day and his Dulera  twice a day.  He was seen in the ED yesterday.  He was found to have a nodule in his lung that was concerning for aspergillosis.  He left AMA.  We did speak with him several times.  In the interim his girlfriend has set up an appointment with pulmonary.  He has been on steroids multiple times with the last time about 6 weeks ago.  He is not having any fever, chills, productive cough.  He currently is not wheezing and does not appear to be dyspneic or in any respiratory distress.    Prior to Admission medications   Medication Sig Start Date End Date Taking? Authorizing Provider  albuterol  (VENTOLIN  HFA) 108 (90 Base) MCG/ACT inhaler Inhale 1-2 puffs into the lungs every 6 (six) hours as needed for wheezing or shortness of breath. 02/29/24   Hoy Nidia FALCON, PA-C  mometasone -formoterol  (DULERA ) 100-5 MCG/ACT AERO Inhale 2 puffs into the lungs 2 (two) times daily. 05/30/24   Levander Houston, MD  montelukast  (SINGULAIR ) 10 MG tablet Take 1 tablet (10 mg total) by mouth at bedtime. 09/24/23   Fleming, Zelda W, NP  ondansetron  (ZOFRAN ) 4 MG tablet Take 1 tablet (4 mg total) by mouth every 8 (eight) hours as needed for nausea or vomiting. 07/15/23   Briana Elgin LABOR, MD  triamcinolone  cream (KENALOG ) 0.1 % Apply 1 Application topically 2 (two) times daily. 04/10/24   Celestia Rosaline SQUIBB, NP    Allergies: Patient has no known allergies.    Review of Systems  Updated Vital Signs BP (!) 141/81   Pulse 89   Temp 98 F (36.7 C) (Oral)   Resp 18   Ht 1.829 m (6')   Wt 96.6 kg   SpO2 95%   BMI 28.89 kg/m    Physical Exam Vitals and nursing note reviewed.  Constitutional:      General: He is not in acute distress.    Appearance: He is well-developed.  HENT:     Head: Normocephalic.     Mouth/Throat:     Mouth: Mucous membranes are moist.  Eyes:     Pupils: Pupils are equal, round, and reactive to light.  Cardiovascular:     Rate and Rhythm: Normal rate and regular rhythm.  Pulmonary:     Effort: Pulmonary effort is normal.     Breath sounds: Normal breath sounds.  Chest:     Chest wall: No mass.  Abdominal:     Palpations: Abdomen is soft.  Musculoskeletal:        General: Normal range of motion.     Cervical back: Normal range of motion.  Skin:    General: Skin is warm.     Capillary Refill: Capillary refill takes less than 2 seconds.  Neurological:     General: No focal deficit present.     Mental Status: He is alert.  Psychiatric:        Mood and Affect: Mood normal.     (all labs ordered are listed, but only abnormal results are displayed) Labs Reviewed  ASPERGILLUS ANTIGEN,  BAL/SERUM  IGE  ASPERGILLUS ANTIBODY BY IMMUNODIFF    EKG: None  Radiology: CT Chest W Contrast Result Date: 05/29/2024 CLINICAL DATA:  Shortness of breath. Possible cavitary nodule on same day chest radiograph EXAM: CT CHEST WITH CONTRAST TECHNIQUE: Multidetector CT imaging of the chest was performed during intravenous contrast administration. RADIATION DOSE REDUCTION: This exam was performed according to the departmental dose-optimization program which includes automated exposure control, adjustment of the mA and/or kV according to patient size and/or use of iterative reconstruction technique. CONTRAST:  75mL OMNIPAQUE  IOHEXOL  350 MG/ML SOLN COMPARISON:  Same day chest radiograph FINDINGS: Cardiovascular: Normal heart size. No significant pericardial fluid/thickening. Great vessels are normal in course and caliber. No central pulmonary emboli. Mediastinum/Nodes: Imaged thyroid gland without  nodules meeting criteria for imaging follow-up by size. Normal esophagus. No pathologically enlarged axillary, supraclavicular, mediastinal, or hilar lymph nodes. Lungs/Pleura: The central airways are patent. Mild diffuse bronchiectasis. Posterior left upper lobe cavitary nodule measures 11 x 10 mm (4:46). Additional solid left upper lobe pulmonary nodules measuring up to 6 x 5 mm (4:18, 54, 58). Scattered ground-glass nodules are also seen, for example in the right upper lobe (4: 45, 46). No pneumothorax. No pleural effusion. Upper abdomen: Normal. Musculoskeletal: No acute or abnormal lytic or blastic osseous lesions. IMPRESSION: 1. 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 2. Mild diffuse bronchiectasis, which may be seen in the setting of reported asthma. Electronically Signed   By: Limin  Xu M.D.   On: 05/29/2024 11:22   DG Chest 2 View Result Date: 05/29/2024 CLINICAL DATA:  Shortness of breath. EXAM: CHEST - 2 VIEW COMPARISON:  Mar 02, 2024. FINDINGS: The heart size and mediastinal contours are within normal limits. Right lung is clear. Interval development of ill-defined nodular density seen in left upper lobe. The visualized skeletal structures are unremarkable. IMPRESSION: Interval development of ill-defined nodular density seen in left upper lobe which potentially may be cavitary. CT scan of the chest is recommended for further evaluation. Electronically Signed   By: Lynwood Landy Raddle M.D.   On: 05/29/2024 08:27     Procedures   Medications Ordered in the ED  albuterol  (VENTOLIN  HFA) 108 (90 Base) MCG/ACT inhaler 2 puff (2 puffs Inhalation Given 05/30/24 0750)  AeroChamber Plus Flo-Vu Medium MISC 1 each (has no administration in time range)                                    Medical Decision Making  23 year old male with history  of asthma.  He is here stating that he is having wheezing.  He reports being out of his albuterol  inhaler.  He is stable here from asthma perspective.  Given that he has been out of his inhalers, we will give him albuterol  inhaler here.  His Dulera  inhaler will be ordered.  Not appear to be indicated to place patient on steroids especially given his recent concern with aspergillosis.  Patient is aware of needed follow-up follow-up for this and return precautions Patient with cavitary lesion that was noted on workup yesterday.  I am attempting to order the appropriate labs as per pulmonary consult.  Patient has follow-up in place.     Final diagnoses:  Asthma, unspecified asthma severity, unspecified whether complicated, unspecified whether persistent  Has run out of medications  Abnormal  CT scan    ED Discharge Orders          Ordered    mometasone -formoterol  (DULERA ) 100-5 MCG/ACT AERO  2 times daily,   Status:  Discontinued        05/30/24 0749    mometasone -formoterol  (DULERA ) 100-5 MCG/ACT AERO  2 times daily        05/30/24 0750               Levander Houston, MD 05/30/24 [phone]

## 2024-05-31 ENCOUNTER — Other Ambulatory Visit (INDEPENDENT_AMBULATORY_CARE_PROVIDER_SITE_OTHER): Payer: Self-pay | Admitting: Primary Care

## 2024-05-31 DIAGNOSIS — J45901 Unspecified asthma with (acute) exacerbation: Secondary | ICD-10-CM

## 2024-05-31 DIAGNOSIS — J4541 Moderate persistent asthma with (acute) exacerbation: Secondary | ICD-10-CM

## 2024-06-02 LAB — IGE: IgE (Immunoglobulin E), Serum: 4705 [IU]/mL — ABNORMAL HIGH (ref 6–495)

## 2024-06-02 LAB — ASPERGILLUS ANTIGEN, BAL/SERUM

## 2024-06-02 NOTE — Telephone Encounter (Signed)
 Requested Prescriptions  Pending Prescriptions Disp Refills   albuterol  (VENTOLIN  HFA) 108 (90 Base) MCG/ACT inhaler 18 g     Sig: Inhale 1-2 puffs into the lungs every 6 (six) hours as needed for wheezing or shortness of breath.     Pulmonology:  Beta Agonists 2 Passed - 06/02/2024 11:32 AM      Passed - Last BP in normal range    BP Readings from Last 1 Encounters:  05/30/24 122/67         Passed - Last Heart Rate in normal range    Pulse Readings from Last 1 Encounters:  05/30/24 67         Passed - Valid encounter within last 12 months    Recent Outpatient Visits           2 months ago Encounter to establish care   [place] [facility] Celestia Rosaline SQUIBB, NP   1 year ago Moderate persistent chronic asthma with acute exacerbation   [place] [facility] Dept Of [place]. [facility] [place], [place] W, NP   2 years ago Moderate persistent chronic asthma with acute exacerbation   [place] [facility] [place] - A Dept Of [place]. [facility] [place], [place] W, NP   2 years ago Moderate persistent chronic asthma with acute exacerbation   [place] [facility] [place] - A Dept Of [place]. [facility] Theotis Haze ORN, NP   4 years ago Uncontrolled mild persistent asthma   [place] [facility] [place] - A Dept Of [place]. [facility] [place], Zelda W, NP               mometasone -formoterol  (DULERA ) 200-5 MCG/ACT AERO 13 g     Sig: Inhale 2 puffs into the lungs 2 (two) times daily.     Pulmonology:  Combination Products Passed - 06/02/2024 11:32 AM      Passed - Valid encounter within last 12 months    Recent Outpatient Visits           2 months ago Encounter to establish care   [place] [facility] Celestia Rosaline SQUIBB, NP   1 year ago Moderate persistent chronic asthma with acute exacerbation   [place] [facility] - A Dept Of [place]. [facility]  [place], [place] W, NP   2 years ago Moderate persistent chronic asthma with acute exacerbation   [facility] [facility] [place] - A Dept Of [place]. [facility] [place], [place] W, NP   2 years ago Moderate persistent chronic asthma with acute exacerbation   [place] [facility] [place] - A Dept Of [place]. [facility] Theotis Haze ORN, NP   4 years ago Uncontrolled mild persistent asthma   [place] [facility] [place] - A Dept Of [place]. [facility] Theotis Haze ORN, [place]

## 2024-06-02 NOTE — Telephone Encounter (Signed)
 Requested medication (s) are due for refill today: yes  Requested medication (s) are on the active medication list: yes  Last refill:  02/29/24 Prescribed at ED under another provider's name   Future visit scheduled: no  Notes to clinic:  prescribed by another provider at ED   Requested Prescriptions  Pending Prescriptions Disp Refills   albuterol  (VENTOLIN  HFA) 108 (90 Base) MCG/ACT inhaler 18 g     Sig: Inhale 1-2 puffs into the lungs every 6 (six) hours as needed for wheezing or shortness of breath.     Pulmonology:  Beta Agonists 2 Passed - 06/02/2024 11:33 AM      Passed - Last BP in normal range    BP Readings from Last 1 Encounters:  05/30/24 122/67         Passed - Last Heart Rate in normal range    Pulse Readings from Last 1 Encounters:  05/30/24 67         Passed - Valid encounter within last 12 months    Recent Outpatient Visits           2 months ago Encounter to establish care   [place] [facility] Celestia Rosaline SQUIBB, NP   1 year ago Moderate persistent chronic asthma with acute exacerbation   [place] [facility] - A Dept Of [place]. [facility] [place], [place] W, NP   2 years ago Moderate persistent chronic asthma with acute exacerbation   [place] [facility] [place] - A Dept Of [place]. [facility] [place], [place] W, NP   2 years ago Moderate persistent chronic asthma with acute exacerbation   [place] [facility] [place] - A Dept Of [place]. [facility] Theotis Haze ORN, NP   4 years ago Uncontrolled mild persistent asthma   [place] [facility] [place] - A Dept Of [place]. [facility] [place], Zelda W, NP              Refused Prescriptions Disp Refills   mometasone -formoterol  (DULERA ) 200-5 MCG/ACT AERO 13 g     Sig: Inhale 2 puffs into the lungs 2 (two) times daily.     Pulmonology:  Combination Products Passed - 06/02/2024 11:33 AM      Passed - Valid encounter  within last 12 months    Recent Outpatient Visits           2 months ago Encounter to establish care   [place] [facility] Celestia Rosaline SQUIBB, NP   1 year ago Moderate persistent chronic asthma with acute exacerbation   [place] [facility] - A Dept Of [place]. [facility] [place], [place] W, NP   2 years ago Moderate persistent chronic asthma with acute exacerbation   [place] [facility] [place] - A Dept Of [place]. [facility] [place], [place] W, NP   2 years ago Moderate persistent chronic asthma with acute exacerbation   [place] [facility] [place] - A Dept Of [place]. [facility] Theotis Haze ORN, NP   4 years ago Uncontrolled mild persistent asthma   [place] [facility] [place] - A Dept Of [place]. [facility] Theotis Haze ORN, [place]

## 2024-06-03 ENCOUNTER — Telehealth: Payer: Self-pay

## 2024-06-03 DIAGNOSIS — J45909 Unspecified asthma, uncomplicated: Secondary | ICD-10-CM

## 2024-06-03 LAB — ASPERGILLUS ANTIBODY BY IMMUNODIFF
Aspergillus flavus: NEGATIVE
Aspergillus fumigatus, IgG: NEGATIVE
Aspergillus niger: NEGATIVE

## 2024-06-03 NOTE — Progress Notes (Signed)
 Complex Care Management Note  Care Guide Note 06/03/2024 Name: JACORION KLEM MRN: [id number] DOB: March 16, 2001  Vicky CHRISTELLA Hamburg is a 23 y.o. year old male who sees Celestia Rosaline SQUIBB, NP for primary care. I reached out to Vicky CHRISTELLA Hamburg by phone today to offer complex care management services.  Mr. Zagal was given information about Complex Care Management services today including:   The Complex Care Management services include support from the care team which includes your Nurse Care Manager, Clinical Social Worker, or Pharmacist.  The Complex Care Management team is here to help remove barriers to the health concerns and goals most important to you. Complex Care Management services are voluntary, and the patient may decline or stop services at any time by request to their care team member.   Complex Care Management Consent Status: Patient agreed to services and verbal consent obtained.   Follow up plan:  Telephone appointment with complex care management team member scheduled for:  06/16/24@ 2 PM  Encounter Outcome:  Patient Scheduled  Leotis Rase [facility] Guide  Direct Dial: [phone]  Fax [phone]

## 2024-06-04 ENCOUNTER — Ambulatory Visit (HOSPITAL_COMMUNITY): Payer: Self-pay

## 2024-06-16 ENCOUNTER — Other Ambulatory Visit: Payer: Self-pay | Admitting: *Deleted

## 2024-06-16 NOTE — Patient Instructions (Signed)
 Vicky CHRISTELLA Hamburg - I am sorry I was unable to reach you today for our scheduled appointment. I work with Celestia Rosaline SQUIBB, NP and am calling to support your healthcare needs. Please contact me at [phone] at your earliest convenience. I look forward to speaking with you soon.   Thank you,   Amzie Sillas, RN, BSN, ACM RN Care Manager [employer] [phone]

## 2024-06-17 ENCOUNTER — Telehealth (INDEPENDENT_AMBULATORY_CARE_PROVIDER_SITE_OTHER): Payer: Self-pay | Admitting: Primary Care

## 2024-06-17 NOTE — Telephone Encounter (Signed)
 Called pt to confirm appt. LVM for pt

## 2024-06-18 ENCOUNTER — Ambulatory Visit (INDEPENDENT_AMBULATORY_CARE_PROVIDER_SITE_OTHER): Admitting: Primary Care

## 2024-06-21 ENCOUNTER — Other Ambulatory Visit: Payer: Self-pay

## 2024-06-21 ENCOUNTER — Emergency Department (HOSPITAL_BASED_OUTPATIENT_CLINIC_OR_DEPARTMENT_OTHER)
Admission: EM | Admit: 2024-06-21 | Discharge: 2024-06-21 | Disposition: A | Discharge status: Home / Self Care | Attending: Emergency Medicine | Admitting: Emergency Medicine

## 2024-06-21 DIAGNOSIS — Z7951 Long term (current) use of inhaled steroids: Secondary | ICD-10-CM | POA: Insufficient documentation

## 2024-06-21 DIAGNOSIS — R062 Wheezing: Secondary | ICD-10-CM | POA: Diagnosis not present

## 2024-06-21 DIAGNOSIS — J45901 Unspecified asthma with (acute) exacerbation: Secondary | ICD-10-CM

## 2024-06-21 DIAGNOSIS — J45909 Unspecified asthma, uncomplicated: Secondary | ICD-10-CM | POA: Diagnosis present

## 2024-06-21 DIAGNOSIS — J4541 Moderate persistent asthma with (acute) exacerbation: Secondary | ICD-10-CM | POA: Insufficient documentation

## 2024-06-21 DIAGNOSIS — F17200 Nicotine dependence, unspecified, uncomplicated: Secondary | ICD-10-CM | POA: Insufficient documentation

## 2024-06-21 DIAGNOSIS — R0689 Other abnormalities of breathing: Secondary | ICD-10-CM | POA: Diagnosis not present

## 2024-06-21 MED ORDER — PREDNISONE 50 MG PO TABS
60.0000 mg | ORAL_TABLET | Freq: Once | ORAL | Status: AC
  Administered 2024-06-21: 60 mg via ORAL
  Filled 2024-06-21: qty 1

## 2024-06-21 MED ORDER — PREDNISONE 50 MG PO TABS: 50.0000 mg | ORAL_TABLET | Freq: Every day | ORAL | 0 refills | Status: DC

## 2024-06-21 MED ORDER — IPRATROPIUM-ALBUTEROL 0.5-2.5 (3) MG/3ML IN SOLN
3.0000 mL | Freq: Once | RESPIRATORY_TRACT | Status: AC
  Administered 2024-06-21: 3 mL via RESPIRATORY_TRACT
  Filled 2024-06-21: qty 3

## 2024-06-21 MED ORDER — ALBUTEROL SULFATE HFA 108 (90 BASE) MCG/ACT IN AERS: 1.0000 | INHALATION_SPRAY | Freq: Four times a day (QID) | RESPIRATORY_TRACT | 3 refills | Status: AC | PRN

## 2024-06-21 MED ORDER — ALBUTEROL SULFATE HFA 108 (90 BASE) MCG/ACT IN AERS
2.0000 | INHALATION_SPRAY | RESPIRATORY_TRACT | Status: DC | PRN
  Administered 2024-06-21: 2 via RESPIRATORY_TRACT
  Filled 2024-06-21: qty 6.7

## 2024-06-21 NOTE — ED Provider Notes (Signed)
 [place] EMERGENCY DEPARTMENT AT [facility] Provider Note   CSN: [id number] Arrival date & time: 06/21/24  9750     Patient presents with: No chief complaint on file.   Hector Gross is a 23 y.o. male.   The history is provided by the patient.   He has history of asthma and states that he had an asthma attack that started at about midnight.  He went to use his inhaler, and noted that it was empty.  He feels that if he whenever able to use his inhaler, he would have been able to stay home.  He denies fever, chills, sweats.  He denies any cough.  He does admit that he still smokes.    Prior to Admission medications   Medication Sig Start Date End Date Taking? Authorizing Provider  albuterol  (VENTOLIN  HFA) 108 (90 Base) MCG/ACT inhaler Inhale 1-2 puffs into the lungs every 6 (six) hours as needed for wheezing or shortness of breath. 02/29/24   Hoy Nidia FALCON, PA-C  mometasone -formoterol  (DULERA ) 100-5 MCG/ACT AERO Inhale 2 puffs into the lungs 2 (two) times daily. 05/30/24   Levander Houston, MD  montelukast  (SINGULAIR ) 10 MG tablet Take 1 tablet (10 mg total) by mouth at bedtime. 09/24/23   Fleming, Zelda W, NP  ondansetron  (ZOFRAN ) 4 MG tablet Take 1 tablet (4 mg total) by mouth every 8 (eight) hours as needed for nausea or vomiting. 07/15/23   Briana Elgin LABOR, MD  triamcinolone  cream (KENALOG ) 0.1 % Apply 1 Application topically 2 (two) times daily. 04/10/24   Celestia Rosaline SQUIBB, NP    Allergies: Patient has no known allergies.    Review of Systems  All other systems reviewed and are negative.   Updated Vital Signs BP 137/68   Pulse 74   Resp 17   SpO2 95%   Physical Exam Vitals and nursing note reviewed.   23 year old male, resting comfortably and in no acute distress. Vital signs are normal. Oxygen saturation is 95%, which is normal. Head is normocephalic and atraumatic. PERRLA, EOMI. Oropharynx is clear. Neck is nontender and supple without  adenopathy. Lungs have a prolonged exhalation phase with a few scattered wheezes.  There are no rales or rhonchi. Chest is nontender. Heart has regular rate and rhythm without murmur. Neurologic: Mental status is normal, cranial nerves are intact, moves all extremities equally.    Procedures   Medications Ordered in the ED  albuterol  (VENTOLIN  HFA) 108 (90 Base) MCG/ACT inhaler 2 puff (2 puffs Inhalation Given 06/21/24 0411)  ipratropium-albuterol  (DUONEB) 0.5-2.5 (3) MG/3ML nebulizer solution 3 mL (3 mLs Nebulization Given 06/21/24 0408)  predniSONE  (DELTASONE ) tablet 60 mg (60 mg Oral Given 06/21/24 0417)                                    Medical Decision Making Risk Prescription drug management.   Asthma exacerbation.  I have ordered nebulizer treatment with albuterol  and ipratropium and I have ordered an initial dose of prednisone .  I have discussed with him the need to always have an inhaler in reserve that is ready for use when current inhaler runs out.  I have reviewed his past records, and I note numerous ED visits for asthma, most recently on 05/30/2024.  I am discharging him with a prescription for prednisone  and albuterol  inhaler.     Final diagnoses:  Moderate asthma with exacerbation, unspecified whether persistent  ED Discharge Orders          Ordered    albuterol  (VENTOLIN  HFA) 108 (90 Base) MCG/ACT inhaler  Every 6 hours PRN        06/21/24 0405    predniSONE  (DELTASONE ) 50 MG tablet  Daily        06/21/24 0405               Raford Lenis, MD 06/21/24 [phone]

## 2024-06-21 NOTE — ED Notes (Signed)
 Patient seen on arrival. Bilateral wheezing present. NAD. Nonproductive, congested cough. VSS on room air.  States he has been using Dulera  at home every 2-3 hours as well as using his albuterol  inhaler frequently and has exhausted the contents of the SABA. He conveys that he runs out of both inhalers every month and usually runs dry on the dulera  before the albuterol . Education provided on proper use of ICS and SABA as well as explained the negative effects of ICS overuse that can result in increased exacerbations, SOB and wheezing.  Patient expresses understanding and agrees he may have been confused on the initial instructions when first prescribed inhaled medications by his PCP.

## 2024-06-21 NOTE — ED Triage Notes (Signed)
 Pt BIB [facility] due to asthma exacerbation, ran out of inhaler, diminished lung sounds per EMS, given 2 duonebs, mag, and solumedrol PTA with improvement.

## 2024-06-21 NOTE — ED Notes (Signed)
 Questions and concerns addressed. Discharge teaching completed.   Prescriptions reviewed and pharmacy verified.   Pt ambulatory upon discharge.

## 2024-06-29 ENCOUNTER — Encounter: Payer: Self-pay | Admitting: Pulmonary Disease

## 2024-06-29 ENCOUNTER — Telehealth: Payer: Self-pay | Admitting: *Deleted

## 2024-06-29 ENCOUNTER — Ambulatory Visit: Admitting: Pulmonary Disease

## 2024-06-29 NOTE — Progress Notes (Unsigned)
 Complex Care Management Care Guide Note  06/29/2024 Name: [name] MRN: [id number] DOB: May 26, 2001  Hector Gross is a 23 y.o. year old male who is a primary care patient of Celestia Rosaline SQUIBB, NP and is actively engaged with the care management team. I reached out to Hector Gross by phone today to assist with re-scheduling  with the RN Case Manager.  Follow up plan: Unsuccessful telephone outreach attempt made. A HIPAA compliant phone message was left for the patient providing contact information and requesting a return call.  Harlene Satterfield  [facility]  Value-Based Care Institute, [facility] Guide  Direct Dial: [phone]  Fax [phone]

## 2024-07-01 NOTE — Progress Notes (Signed)
 Complex Care Management Care Guide Note  07/01/2024 Name: Hector Gross MRN: [id number] DOB: 04-02-2001  Hector Gross is a 23 y.o. year old male who is a primary care patient of Celestia Rosaline SQUIBB, NP and is actively engaged with the care management team. I reached out to Hector Gross by phone today to assist with re-scheduling  with the RN Case Manager.  Follow up plan: Unsuccessful telephone outreach attempt made. A HIPAA compliant phone message was left for the patient providing contact information and requesting a return call. No further outreach attempts will be made at this time. We have been unable to contact the patient to reschedule for complex care management services.   Harlene Satterfield  [facility]  Value-Based Care Institute, [facility] Guide  Direct Dial: [phone]  Fax [phone]

## 2024-07-01 NOTE — Progress Notes (Signed)
 Complex Care Management Care Guide Note  07/01/2024 Name: Hector Gross MRN: [id number] DOB: Sep 11, 2001  Hector Gross is a 23 y.o. year old male who is a primary care patient of Celestia Rosaline SQUIBB, NP and is actively engaged with the care management team. I reached out to Hector Gross by phone today to assist with re-scheduling  with the RN Case Manager.  Follow up plan: Telephone appointment with complex care management team member scheduled for:  07/16/24  Harlene Satterfield  [facility]  [facility], [facility] Guide  Direct Dial: [phone]  Fax [phone]

## 2024-07-16 ENCOUNTER — Other Ambulatory Visit: Payer: Self-pay

## 2024-07-16 ENCOUNTER — Other Ambulatory Visit: Payer: Self-pay | Admitting: *Deleted

## 2024-07-16 NOTE — Patient Instructions (Signed)
 Visit Information  Hector Gross was given information about Medicaid Managed Care team care coordination services as a part of their Healthy [facility] Medicaid benefit. Hector Gross   If you would like to schedule transportation through your Healthy [facility] plan, please call the following number at least 2 days in advance of your appointment: [phone]  For information about your ride after you set it up, call Ride Assist at [phone]. Use this number to activate a Will Call pickup, or if your transportation is late for a scheduled pickup. Use this number, too, if you need to make a change or cancel a previously scheduled reservation.  If you need transportation services right away, call [phone]. The after-hours call center is staffed 24 hours to handle ride assistance and urgent reservation requests (including discharges) 365 days a year. Urgent trips include sick visits, hospital discharge requests and life-sustaining treatment.  Call the [facility] Line at [phone], at any time, 24 hours a day, 7 days a week. If you are in danger or need immediate medical attention call 911.   Please see education materials related to Asthma provided by MyChart link.  Patient verbalizes understanding of instructions and care plan provided today and agrees to view in MyChart. Active MyChart status and patient understanding of how to access instructions and care plan via MyChart confirmed with patient.     Telephone follow up appointment with Managed Medicaid care management team member scheduled for: 08/06/24 @ 3pm  Mayrani Khamis, RN, BSN, ACM RN Care Manager [facility] [phone]   Following is a copy of your plan of care:  There are no care plans that you recently modified to display for this patient.  Visit Information  Hector Gross was given information about Medicaid Managed Care team care coordination services as a part of their Healthy [facility] Medicaid  benefit. Hector Gross   If you would like to schedule transportation through your Healthy [facility] plan, please call the following number at least 2 days in advance of your appointment: [phone]  For information about your ride after you set it up, call Ride Assist at [phone]. Use this number to activate a Will Call pickup, or if your transportation is late for a scheduled pickup. Use this number, too, if you need to make a change or cancel a previously scheduled reservation.  If you need transportation services right away, call [phone]. The after-hours call center is staffed 24 hours to handle ride assistance and urgent reservation requests (including discharges) 365 days a year. Urgent trips include sick visits, hospital discharge requests and life-sustaining treatment.  Call the [facility] Line at [phone], at any time, 24 hours a day, 7 days a week. If you are in danger or need immediate medical attention call 911.   Please see education materials related to Asthma provided by MyChart link.  Patient verbalizes understanding of instructions and care plan provided today and agrees to view in MyChart. Active MyChart status and patient understanding of how to access instructions and care plan via MyChart confirmed with patient.     Telephone follow up appointment with Managed Medicaid care management team member scheduled for: 08/06/24 @ 3 pm  Shandell Jallow, RN, BSN, Theatre manager [employer] [phone]

## 2024-07-16 NOTE — Patient Outreach (Signed)
 Complex Care Management   Visit Note  07/16/2024  Name:  Hector Gross MRN: [id number] DOB: 2000/11/04  Situation: Referral received for Complex Care Management related to Asthma I obtained verbal consent from Patient.  Visit completed with Patient  on the phone  Background:   Past Medical History:  Diagnosis Date   Asthma     Assessment: Patient Reported Symptoms:  Cognitive Cognitive Status: Able to follow simple commands, Alert and oriented to person, place, and time, Insightful and able to interpret abstract concepts, Normal speech and language skills Cognitive/Intellectual Conditions Management [RPT]: None reported or documented in medical history or problem list   Health Maintenance Behaviors: Annual physical exam, Exercise, Healthy diet, Sleep adequate, Stress management Healing Pattern: Average Health Facilitated by: Healthy diet, Rest, Stress management  Neurological Neurological Review of Symptoms: No symptoms reported Neurological Management Strategies: Routine screening, Adequate rest Neurological Self-Management Outcome: 4 (good)  HEENT HEENT Symptoms Reported: No symptoms reported HEENT Management Strategies: Adequate rest, Routine screening    Cardiovascular Cardiovascular Symptoms Reported: No symptoms reported Cardiovascular Management Strategies: Adequate rest, Routine screening Cardiovascular Self-Management Outcome: 4 (good)  Respiratory Respiratory Symptoms Reported: No symptoms reported Respiratory Management Strategies: Adequate rest, Routine screening, Asthma action plan Respiratory Self-Management Outcome: 4 (good)  Endocrine Endocrine Symptoms Reported: No symptoms reported Is patient diabetic?: No Endocrine Self-Management Outcome: 4 (good)  Gastrointestinal Gastrointestinal Symptoms Reported: No symptoms reported Gastrointestinal Management Strategies: Adequate rest Gastrointestinal Self-Management Outcome: 4 (good)    Genitourinary  Genitourinary Symptoms Reported: No symptoms reported Genitourinary Management Strategies: Adequate rest Genitourinary Self-Management Outcome: 4 (good)  Integumentary Integumentary Symptoms Reported: No symptoms reported Skin Management Strategies: Adequate rest, Routine screening Skin Self-Management Outcome: 4 (good)  Musculoskeletal Musculoskelatal Symptoms Reviewed: No symptoms reported Musculoskeletal Management Strategies: Adequate rest, Routine screening Musculoskeletal Self-Management Outcome: 4 (good) Falls in the past year?: No Number of falls in past year: 1 or less Was there an injury with Fall?: No Fall Risk Category Calculator: 0 Patient Fall Risk Level: Low Fall Risk Patient at Risk for Falls Due to: No Fall Risks  Psychosocial Psychosocial Symptoms Reported: No symptoms reported Behavioral Management Strategies: Activity, Coping strategies, Abstinence from substances, Adequate rest Behavioral Health Self-Management Outcome: 4 (good) Major Change/Loss/Stressor/Fears (CP): Denies Quality of Family Relationships: involved, helpful, supportive Do you feel physically threatened by others?: No    07/16/2024    PHQ2-9 Depression Screening   Little interest or pleasure in doing things Not at all  Feeling down, depressed, or hopeless Not at all  PHQ-2 - Total Score 0  Trouble falling or staying asleep, or sleeping too much    Feeling tired or having little energy    Poor appetite or overeating     Feeling bad about yourself - or that you are a failure or have let yourself or your family down    Trouble concentrating on things, such as reading the newspaper or watching television    Moving or speaking so slowly that other people could have noticed.  Or the opposite - being so fidgety or restless that you have been moving around a lot more than usual    Thoughts that you would be better off dead, or hurting yourself in some way    PHQ2-9 Total Score    If you checked off any  problems, how difficult have these problems made it for you to do your work, take care of things at home, or get along with other people    Depression Interventions/Treatment  There were no vitals filed for this visit.  Medications Reviewed Today     Reviewed by Jorja Nichole LABOR, RN (Case Manager) on 07/16/24 at 1328  Med List Status: <None>   Medication Order Taking? Sig Documenting Provider Last Dose Status Informant  albuterol  (VENTOLIN  HFA) 108 (90 Base) MCG/ACT inhaler [id number] Yes Inhale 1-2 puffs into the lungs every 6 (six) hours as needed for wheezing or shortness of breath. Raford Lenis, MD  Active   mometasone -formoterol  (DULERA ) 100-5 MCG/ACT TERESE [id number] Yes Inhale 2 puffs into the lungs 2 (two) times daily. Levander Houston, MD  Active   montelukast  (SINGULAIR ) 10 MG tablet [id number] Yes Take 1 tablet (10 mg total) by mouth at bedtime. Theotis Haze ORN, NP  Active   ondansetron  (ZOFRAN ) 4 MG tablet [id number] Yes Take 1 tablet (4 mg total) by mouth every 8 (eight) hours as needed for nausea or vomiting. Briana Elgin LABOR, MD  Active   predniSONE  (DELTASONE ) 50 MG tablet [id number]  Take 1 tablet (50 mg total) by mouth daily.  Patient not taking: Reported on 07/16/2024   Raford Lenis, MD  Active   triamcinolone  cream (KENALOG ) 0.1 % [id number] Yes Apply 1 Application topically 2 (two) times daily. Celestia Rosaline SQUIBB, NP  Active             Recommendation:   PCP Follow-up Continue Current Plan of Care  Follow Up Plan:   Telephone follow-up 2 weeks; 08/06/24 @ 3 pm  Dennie Moltz, RN, BSN, [employer] RN Care Manager [employer] [phone]

## 2024-07-18 ENCOUNTER — Other Ambulatory Visit (INDEPENDENT_AMBULATORY_CARE_PROVIDER_SITE_OTHER): Payer: Self-pay | Admitting: Primary Care

## 2024-07-18 DIAGNOSIS — J4541 Moderate persistent asthma with (acute) exacerbation: Secondary | ICD-10-CM

## 2024-07-20 ENCOUNTER — Other Ambulatory Visit: Payer: Self-pay

## 2024-07-20 ENCOUNTER — Other Ambulatory Visit (HOSPITAL_COMMUNITY): Payer: Self-pay

## 2024-07-22 ENCOUNTER — Encounter: Payer: Self-pay | Admitting: Pharmacist

## 2024-07-22 ENCOUNTER — Other Ambulatory Visit: Payer: Self-pay

## 2024-07-27 ENCOUNTER — Other Ambulatory Visit: Payer: Self-pay

## 2024-08-06 ENCOUNTER — Other Ambulatory Visit: Payer: Self-pay | Admitting: *Deleted

## 2024-08-06 NOTE — Patient Instructions (Signed)
 Hector Gross - I am sorry  you were unable to complete outreach today.  I have rescheduled your appointment for 08/28/24 @ 1130 am.  I work with Celestia, Rosaline SQUIBB, NP and am calling to support your healthcare needs. Please contact me at [phone] if you should need to reschedule. I look forward to speaking with you soon.   Thank you,  Erby Sanderson, RN, BSN, ACM RN Care Manager [employer] [phone]

## 2024-08-12 NOTE — Telephone Encounter (Signed)
Nothing further to add.

## 2024-08-28 ENCOUNTER — Telehealth: Payer: Self-pay | Admitting: *Deleted

## 2024-08-29 ENCOUNTER — Other Ambulatory Visit: Payer: Self-pay

## 2024-08-29 ENCOUNTER — Encounter (HOSPITAL_COMMUNITY): Payer: Self-pay | Admitting: Emergency Medicine

## 2024-08-29 ENCOUNTER — Emergency Department (HOSPITAL_COMMUNITY)
Admission: EM | Admit: 2024-08-29 | Discharge: 2024-08-29 | Discharge status: Against Medical Advice | Attending: Emergency Medicine | Admitting: Emergency Medicine

## 2024-08-29 DIAGNOSIS — Z5321 Procedure and treatment not carried out due to patient leaving prior to being seen by health care provider: Secondary | ICD-10-CM | POA: Diagnosis not present

## 2024-08-29 DIAGNOSIS — R0602 Shortness of breath: Secondary | ICD-10-CM | POA: Diagnosis present

## 2024-08-29 MED ORDER — ALBUTEROL SULFATE HFA 108 (90 BASE) MCG/ACT IN AERS
2.0000 | INHALATION_SPRAY | Freq: Once | RESPIRATORY_TRACT | Status: AC
  Administered 2024-08-29: 2 via RESPIRATORY_TRACT
  Filled 2024-08-29: qty 6.7

## 2024-08-29 NOTE — ED Notes (Signed)
 Pt called in lobby multiple times, not seen in lobby.

## 2024-08-29 NOTE — ED Triage Notes (Signed)
 Pt arrive POV for c/o asthma flare up, ran out of his inhaler.

## 2024-08-31 ENCOUNTER — Emergency Department (HOSPITAL_COMMUNITY)

## 2024-08-31 ENCOUNTER — Encounter (INDEPENDENT_AMBULATORY_CARE_PROVIDER_SITE_OTHER): Payer: Self-pay | Admitting: Primary Care

## 2024-08-31 ENCOUNTER — Emergency Department (HOSPITAL_COMMUNITY): Admission: EM | Admit: 2024-08-31 | Discharge: 2024-08-31 | Disposition: A | Discharge status: Home / Self Care

## 2024-08-31 ENCOUNTER — Encounter (HOSPITAL_COMMUNITY): Payer: Self-pay | Admitting: Emergency Medicine

## 2024-08-31 DIAGNOSIS — R918 Other nonspecific abnormal finding of lung field: Secondary | ICD-10-CM | POA: Diagnosis not present

## 2024-08-31 DIAGNOSIS — R0602 Shortness of breath: Secondary | ICD-10-CM | POA: Diagnosis present

## 2024-08-31 DIAGNOSIS — Z7951 Long term (current) use of inhaled steroids: Secondary | ICD-10-CM | POA: Insufficient documentation

## 2024-08-31 DIAGNOSIS — Z7952 Long term (current) use of systemic steroids: Secondary | ICD-10-CM | POA: Insufficient documentation

## 2024-08-31 DIAGNOSIS — J45901 Unspecified asthma with (acute) exacerbation: Secondary | ICD-10-CM | POA: Diagnosis not present

## 2024-08-31 DIAGNOSIS — J45909 Unspecified asthma, uncomplicated: Secondary | ICD-10-CM | POA: Diagnosis not present

## 2024-08-31 MED ORDER — PREDNISONE 50 MG PO TABS: 50.0000 mg | ORAL_TABLET | Freq: Every day | ORAL | 0 refills | Status: AC

## 2024-08-31 MED ORDER — IPRATROPIUM-ALBUTEROL 0.5-2.5 (3) MG/3ML IN SOLN
3.0000 mL | Freq: Once | RESPIRATORY_TRACT | Status: AC
  Administered 2024-08-31: 3 mL via RESPIRATORY_TRACT
  Filled 2024-08-31: qty 3

## 2024-08-31 MED ORDER — METHYLPREDNISOLONE SODIUM SUCC 125 MG IJ SOLR
125.0000 mg | Freq: Once | INTRAMUSCULAR | Status: AC
  Administered 2024-08-31: 125 mg via INTRAVENOUS
  Filled 2024-08-31: qty 2

## 2024-08-31 NOTE — Discharge Instructions (Signed)
 Please follow-up with your primary doctor regarding the chest x-ray findings with persistent opacifications.  Return for fevers, chills, productive cough, lethargy, difficulty breathing, shortness of breath, you pass out or any new or worsening symptoms that are concerning to you.

## 2024-08-31 NOTE — ED Notes (Signed)
 PT ambulated down hall and sats maintianed at 95%. PT reports feeling better.

## 2024-08-31 NOTE — ED Provider Triage Note (Signed)
 Emergency Medicine Provider Triage Evaluation Note  Hector Gross , a 23 y.o. male  was evaluated in triage.  Pt complains of shortness of breath.  History of asthma, has his rescue inhaler, but out of his other medications.  Shortness of breath started today.  Received DuoNebs with EMS, but no steroids.  Does report improvement of his shortness of breath, but still persist  Review of Systems  Positive: Shortness of breath Negative: URI symptoms  Physical Exam  SpO2 95%  Gen:   Awake, no distress   Resp:  Normal effort; diffuse wheezing.  Not in respiratory distress MSK:   Moves extremities without difficulty  Other:  Equal pulses.  Medical Decision Making  Medically screening exam initiated at 9:02 PM.  Appropriate orders placed.  Hector Gross was informed that the remainder of the evaluation will be completed by another provider, this initial triage assessment does not replace that evaluation, and the importance of remaining in the ED until their evaluation is complete.  23 year old with history of asthma with what sounds like asthma exacerbation.  Received 2 DuoNebs prior to arrival with improvement.  Does not appear to be in overt respiratory distress, but still having wheezing.  Oxygen saturation borderline at 92%.  Will give Solu-Medrol  and repeat DuoNeb.  Chest x-ray ordered as well   Hector Caron PARAS, DO 08/31/24 2103

## 2024-08-31 NOTE — ED Provider Notes (Signed)
 [place] EMERGENCY DEPARTMENT AT [facility] Provider Note   CSN: [id number] Arrival date & time: 08/31/24  2036     Patient presents with: Asthma   Hector Gross is a 23 y.o. male.   23 year old male presenting emergency department for shortness of breath.  Symptoms started today.  No URI symptoms.  Awoke from sleep with wheezing and difficulty breathing.  Feels similar to typical asthma exacerbations.  Feels better after DuoNebs from EMS   Asthma       Prior to Admission medications   Medication Sig Start Date End Date Taking? Authorizing Provider  albuterol  (VENTOLIN  HFA) 108 (90 Base) MCG/ACT inhaler Inhale 1-2 puffs into the lungs every 6 (six) hours as needed for wheezing or shortness of breath. 06/21/24   Raford Lenis, MD  mometasone -formoterol  (DULERA ) 100-5 MCG/ACT AERO Inhale 2 puffs into the lungs 2 (two) times daily. 05/30/24   Levander Houston, MD  montelukast  (SINGULAIR ) 10 MG tablet Take 1 tablet (10 mg total) by mouth at bedtime. 09/24/23   Fleming, Zelda W, NP  ondansetron  (ZOFRAN ) 4 MG tablet Take 1 tablet (4 mg total) by mouth every 8 (eight) hours as needed for nausea or vomiting. 07/15/23   Briana Elgin LABOR, MD  predniSONE  (DELTASONE ) 50 MG tablet Take 1 tablet (50 mg total) by mouth daily. Patient not taking: Reported on 07/16/2024 06/21/24   Raford Lenis, MD  triamcinolone  cream (KENALOG ) 0.1 % Apply 1 Application topically 2 (two) times daily. 04/10/24   Celestia Rosaline SQUIBB, NP    Allergies: Patient has no known allergies.    Review of Systems  Updated Vital Signs BP (!) 151/93 (BP Location: Right Arm)   Pulse 97   Temp 97.7 F (36.5 C) (Oral)   Resp (!) 21   SpO2 95%   Physical Exam Vitals and nursing note reviewed.  Constitutional:      General: He is not in acute distress.    Appearance: He is not toxic-appearing.  HENT:     Head: Normocephalic.     Nose: Nose normal.     Mouth/Throat:     Mouth: Mucous membranes are moist.  Eyes:      Conjunctiva/sclera: Conjunctivae normal.  Cardiovascular:     Rate and Rhythm: Normal rate and regular rhythm.  Pulmonary:     Effort: Pulmonary effort is normal.     Breath sounds: Wheezing present.  Abdominal:     General: Abdomen is flat. There is no distension.     Palpations: Abdomen is soft.     Tenderness: There is no abdominal tenderness. There is no guarding or rebound.  Musculoskeletal:        General: Normal range of motion.  Skin:    General: Skin is warm and dry.     Capillary Refill: Capillary refill takes less than 2 seconds.  Neurological:     Mental Status: He is alert and oriented to person, place, and time.  Psychiatric:        Mood and Affect: Mood normal.        Behavior: Behavior normal.     (all labs ordered are listed, but only abnormal results are displayed) Labs Reviewed - No data to display  EKG: None  Radiology: DG Chest Portable 1 View Result Date: 08/31/2024 EXAM: 1 VIEW(S) XRAY OF THE CHEST 08/31/2024 09:02:00 PM COMPARISON: Chest x-ray 05/29/2024, CT chest 05/29/2024. CLINICAL HISTORY: asthma FINDINGS: LUNGS AND PLEURA: Previously identified left upper lobe nodule no longer visualized. Subjacent subcentimeter  airspace opacity still noted within the left upper lobe. No pleural effusion. No pneumothorax. HEART AND MEDIASTINUM: No acute abnormality of the cardiac and mediastinal silhouettes. BONES AND SOFT TISSUES: No acute osseous abnormality. IMPRESSION: 1. Previously identified left upper lobe nodule no longer visualized. Subcentimeter airspace opacity persists in the left upper lobe. 2. No acute findings. Electronically signed by: Morgane Naveau MD 08/31/2024 09:57 PM EST RP Workstation: [id number]     Procedures   Medications Ordered in the ED  methylPREDNISolone  sodium succinate (SOLU-MEDROL ) 125 mg/2 mL injection 125 mg (125 mg Intravenous Given 08/31/24 2112)  ipratropium-albuterol  (DUONEB) 0.5-2.5 (3) MG/3ML nebulizer solution 3 mL (3  mLs Nebulization Given 08/31/24 2114)                                    Medical Decision Making 23 year old presented with shortness of breath, received DuoNebs prior to arrival but no steroids.  Saturation initially somewhat borderline at 92%, but improved after breathing treatment.  Ambulated without desaturation.  Chest x-ray without pneumonia pneumothorax.  Did have some prior nodules that were improved today, but still having some opacification on x-ray.  He was made aware of these and need for follow-up with his primary doctor/pulmonologist.  He has no fevers, chills, cough to suggest acute pneumonia.  Will give a short course of steroids.  Has albuterol  prescription.  Will discharge in stable condition.  Amount and/or Complexity of Data Reviewed External Data Reviewed:     Details: CT chest 05/29/24: IMPRESSION: 1. 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 2. Mild diffuse bronchiectasis, which may be seen in the setting of reported asthma.  Radiology: ordered and independent interpretation performed.    Details: See above  Risk Prescription drug management. Decision regarding hospitalization. Diagnosis or treatment significantly limited by social determinants of health.        Final diagnoses:  None    ED Discharge Orders     None          Neysa Caron PARAS, DO 08/31/24 2215

## 2024-08-31 NOTE — ED Triage Notes (Signed)
 Hx of asthma and was seen on 15th. Sick with cold and used inhaler given to him at previous visit. RA sat 92 % with EMS. 2 duenebs given by EMS.  No steroids or mag.

## 2024-09-05 ENCOUNTER — Emergency Department (HOSPITAL_COMMUNITY)
Admission: EM | Admit: 2024-09-05 | Discharge: 2024-09-05 | Disposition: A | Discharge status: Home / Self Care | Attending: Emergency Medicine | Admitting: Emergency Medicine

## 2024-09-05 ENCOUNTER — Other Ambulatory Visit: Payer: Self-pay

## 2024-09-05 DIAGNOSIS — Z7951 Long term (current) use of inhaled steroids: Secondary | ICD-10-CM | POA: Diagnosis not present

## 2024-09-05 DIAGNOSIS — J45901 Unspecified asthma with (acute) exacerbation: Secondary | ICD-10-CM | POA: Diagnosis not present

## 2024-09-05 DIAGNOSIS — R0602 Shortness of breath: Secondary | ICD-10-CM | POA: Diagnosis present

## 2024-09-05 MED ORDER — ALBUTEROL SULFATE (2.5 MG/3ML) 0.083% IN NEBU
5.0000 mg | INHALATION_SOLUTION | Freq: Once | RESPIRATORY_TRACT | Status: AC
  Administered 2024-09-05: 5 mg via RESPIRATORY_TRACT
  Filled 2024-09-05: qty 6

## 2024-09-05 MED ORDER — ALBUTEROL SULFATE HFA 108 (90 BASE) MCG/ACT IN AERS
2.0000 | INHALATION_SPRAY | RESPIRATORY_TRACT | Status: DC | PRN
  Administered 2024-09-05: 2 via RESPIRATORY_TRACT
  Filled 2024-09-05: qty 6.7

## 2024-09-05 MED ORDER — IPRATROPIUM-ALBUTEROL 0.5-2.5 (3) MG/3ML IN SOLN
3.0000 mL | Freq: Once | RESPIRATORY_TRACT | Status: AC
  Administered 2024-09-05: 3 mL via RESPIRATORY_TRACT
  Filled 2024-09-05: qty 3

## 2024-09-05 NOTE — ED Provider Notes (Signed)
 [place] EMERGENCY DEPARTMENT AT [facility] Provider Note   CSN: [id number] Arrival date & time: 09/05/24  0502     Patient presents with: Wheezing   Hector Gross is a 23 y.o. male.   Presents to the emergency department for evaluation of shortness of breath, wheezing secondary to asthma.  Patient reports that he was here a few days ago, got a prescription for prednisone  but has not filled that yet.  He was not given an inhaler at that time, does not currently have one.       Prior to Admission medications   Medication Sig Start Date End Date Taking? Authorizing Provider  albuterol  (VENTOLIN  HFA) 108 (90 Base) MCG/ACT inhaler Inhale 1-2 puffs into the lungs every 6 (six) hours as needed for wheezing or shortness of breath. 06/21/24   Raford Lenis, MD  mometasone -formoterol  (DULERA ) 100-5 MCG/ACT AERO Inhale 2 puffs into the lungs 2 (two) times daily. 05/30/24   Levander Houston, MD  montelukast  (SINGULAIR ) 10 MG tablet Take 1 tablet (10 mg total) by mouth at bedtime. 09/24/23   Fleming, Zelda W, NP  ondansetron  (ZOFRAN ) 4 MG tablet Take 1 tablet (4 mg total) by mouth every 8 (eight) hours as needed for nausea or vomiting. 07/15/23   Briana Elgin LABOR, MD  triamcinolone  cream (KENALOG ) 0.1 % Apply 1 Application topically 2 (two) times daily. 04/10/24   Celestia Rosaline SQUIBB, NP    Allergies: Patient has no known allergies.    Review of Systems  Updated Vital Signs BP 129/74 (BP Location: Right Arm)   Pulse 72   Temp 97.7 F (36.5 C) (Oral)   Resp 17   SpO2 93%   Physical Exam Vitals and nursing note reviewed.  Constitutional:      General: He is not in acute distress.    Appearance: He is well-developed.  HENT:     Head: Normocephalic and atraumatic.     Mouth/Throat:     Mouth: Mucous membranes are moist.  Eyes:     General: Vision grossly intact. Gaze aligned appropriately.     Extraocular Movements: Extraocular movements intact.     Conjunctiva/sclera:  Conjunctivae normal.  Cardiovascular:     Rate and Rhythm: Normal rate and regular rhythm.     Pulses: Normal pulses.     Heart sounds: Normal heart sounds, S1 normal and S2 normal. No murmur heard.    No friction rub. No gallop.  Pulmonary:     Effort: Pulmonary effort is normal. No respiratory distress.     Breath sounds: Decreased air movement present. Decreased breath sounds present.  Abdominal:     Palpations: Abdomen is soft.     Tenderness: There is no abdominal tenderness. There is no guarding or rebound.     Hernia: No hernia is present.  Musculoskeletal:        General: No swelling.     Cervical back: Full passive range of motion without pain, normal range of motion and neck supple. No pain with movement, spinous process tenderness or muscular tenderness. Normal range of motion.     Right lower leg: No edema.     Left lower leg: No edema.  Skin:    General: Skin is warm and dry.     Capillary Refill: Capillary refill takes less than 2 seconds.     Findings: No ecchymosis, erythema, lesion or wound.  Neurological:     Mental Status: He is alert and oriented to person, place, and time.  GCS: GCS eye subscore is 4. GCS verbal subscore is 5. GCS motor subscore is 6.     Cranial Nerves: Cranial nerves 2-12 are intact.     Sensory: Sensation is intact.     Motor: Motor function is intact. No weakness or abnormal muscle tone.     Coordination: Coordination is intact.  Psychiatric:        Mood and Affect: Mood normal.        Speech: Speech normal.        Behavior: Behavior normal.     (all labs ordered are listed, but only abnormal results are displayed) Labs Reviewed - No data to display  EKG: None  Radiology: No results found.   Procedures   Medications Ordered in the ED  ipratropium-albuterol  (DUONEB) 0.5-2.5 (3) MG/3ML nebulizer solution 3 mL (has no administration in time range)  albuterol  (PROVENTIL ) (2.5 MG/3ML) 0.083% nebulizer solution 5 mg (5 mg  Nebulization Given 09/05/24 0506)                                    Medical Decision Making Risk Prescription drug management.   Presents with wheezing.  Patient improved after albuterol  nebulizer and triage.  Patient with decreased air movements but no distress.  Will give additional nebulizers and patient to be discharged with an inhaler and hand.  He will pick up his prednisone  prescription today.     Final diagnoses:  Exacerbation of asthma, unspecified asthma severity, unspecified whether persistent    ED Discharge Orders     None          Marybell Robards, Lonni PARAS, MD 09/05/24 [phone]

## 2024-09-05 NOTE — ED Triage Notes (Signed)
 Patient reports asthma attack with wheezing and dry cough this morning .

## 2024-09-14 ENCOUNTER — Emergency Department (HOSPITAL_COMMUNITY)
Admission: EM | Admit: 2024-09-14 | Discharge: 2024-09-14 | Disposition: A | Discharge status: Home / Self Care | Attending: Emergency Medicine | Admitting: Emergency Medicine

## 2024-09-14 ENCOUNTER — Encounter (HOSPITAL_COMMUNITY): Payer: Self-pay

## 2024-09-14 ENCOUNTER — Emergency Department (HOSPITAL_COMMUNITY)

## 2024-09-14 ENCOUNTER — Other Ambulatory Visit: Payer: Self-pay

## 2024-09-14 DIAGNOSIS — R918 Other nonspecific abnormal finding of lung field: Secondary | ICD-10-CM | POA: Diagnosis not present

## 2024-09-14 DIAGNOSIS — J45901 Unspecified asthma with (acute) exacerbation: Secondary | ICD-10-CM | POA: Diagnosis not present

## 2024-09-14 DIAGNOSIS — R410 Disorientation, unspecified: Secondary | ICD-10-CM | POA: Insufficient documentation

## 2024-09-14 DIAGNOSIS — R059 Cough, unspecified: Secondary | ICD-10-CM | POA: Diagnosis not present

## 2024-09-14 DIAGNOSIS — J984 Other disorders of lung: Secondary | ICD-10-CM | POA: Diagnosis not present

## 2024-09-14 DIAGNOSIS — R0602 Shortness of breath: Secondary | ICD-10-CM | POA: Diagnosis not present

## 2024-09-14 LAB — RESP PANEL BY RT-PCR (RSV, FLU A&B, COVID)  RVPGX2
Influenza A by PCR: NEGATIVE
Influenza B by PCR: NEGATIVE
Resp Syncytial Virus by PCR: NEGATIVE
SARS Coronavirus 2 by RT PCR: NEGATIVE

## 2024-09-14 MED ORDER — MAGNESIUM SULFATE 2 GM/50ML IV SOLN
2.0000 g | Freq: Once | INTRAVENOUS | Status: AC
  Administered 2024-09-14: 2 g via INTRAVENOUS
  Filled 2024-09-14: qty 50

## 2024-09-14 MED ORDER — IPRATROPIUM-ALBUTEROL 0.5-2.5 (3) MG/3ML IN SOLN
3.0000 mL | Freq: Once | RESPIRATORY_TRACT | Status: AC
  Administered 2024-09-14: 3 mL via RESPIRATORY_TRACT
  Filled 2024-09-14: qty 3

## 2024-09-14 MED ORDER — METHYLPREDNISOLONE SODIUM SUCC 125 MG IJ SOLR
125.0000 mg | Freq: Once | INTRAMUSCULAR | Status: AC
  Administered 2024-09-14: 125 mg via INTRAVENOUS
  Filled 2024-09-14: qty 2

## 2024-09-14 MED ORDER — PREDNISONE 10 MG (21) PO TBPK: ORAL_TABLET | Freq: Every day | ORAL | 0 refills | Status: AC

## 2024-09-14 MED ORDER — IPRATROPIUM-ALBUTEROL 0.5-2.5 (3) MG/3ML IN SOLN: 3.0000 mL | Freq: Four times a day (QID) | RESPIRATORY_TRACT | 0 refills | Status: AC | PRN

## 2024-09-14 NOTE — ED Triage Notes (Signed)
 C/O SHOB since this morning. Hx of asthma and states used inhaler but did not work. Axox4. C/O CP.

## 2024-09-14 NOTE — ED Provider Triage Note (Signed)
 Emergency Medicine Provider Triage Evaluation Note  LEVY CEDANO , a 23 y.o. male  was evaluated in triage.  Pt complains of sob. Endorse runny nose, dry cough, increase sob and wheezing for the past several days.  Tried using his inhaler around the clock but without relief.  Sts sxs was well controlled with Dulera  but ran out of it 2 months ago. No hx of PE/DVT  Review of Systems  Positive: As above Negative: As above  Physical Exam  BP 130/87 (BP Location: Right Arm)   Pulse (!) 111   Temp 97.8 F (36.6 C)   Resp 18   Ht 6' (1.829 m)   Wt 90.7 kg   SpO2 97%   BMI 27.12 kg/m  Gen:   Awake, no distress   Resp:  Normal effort  MSK:   Moves extremities without difficulty  Other:  tachy  Medical Decision Making  Medically screening exam initiated at 3:53 PM.  Appropriate orders placed.  DRAYCEN LEICHTER was informed that the remainder of the evaluation will be completed by another provider, this initial triage assessment does not replace that evaluation, and the importance of remaining in the ED until their evaluation is complete.     Nivia Colon, PA-C 09/14/24 1554

## 2024-09-14 NOTE — ED Provider Notes (Signed)
 [place] EMERGENCY DEPARTMENT AT [facility] Provider Note   CSN: [id number] Arrival date & time: 09/14/24  1507     Patient presents with: Shortness of Breath   Hector Gross is a 23 y.o. male presents today for shortness of breath.  Patient has a history of asthma and has tried using his albuterol  inhaler without relief.  Patient also reporting cough and congestion.  Patient denies fever, chills, nausea, vomiting, chest pain, or any other complaints at this time.    Shortness of Breath Associated symptoms: cough and wheezing        Prior to Admission medications   Medication Sig Start Date End Date Taking? Authorizing Provider  ipratropium-albuterol  (DUONEB) 0.5-2.5 (3) MG/3ML SOLN Take 3 mLs by nebulization every 6 (six) hours as needed. 09/14/24  Yes Hector Ileana SAILOR, PA-C  predniSONE  (STERAPRED UNI-PAK 21 TAB) 10 MG (21) TBPK tablet Take by mouth daily. Take 6 tabs by mouth daily  for 2 days, then 5 tabs for 2 days, then 4 tabs for 2 days, then 3 tabs for 2 days, 2 tabs for 2 days, then 1 tab by mouth daily for 2 days 09/14/24  Yes Hector Ileana SAILOR, PA-C  albuterol  (VENTOLIN  HFA) 108 (90 Base) MCG/ACT inhaler Inhale 1-2 puffs into the lungs every 6 (six) hours as needed for wheezing or shortness of breath. 06/21/24   Hector Lenis, MD  mometasone -formoterol  (DULERA ) 100-5 MCG/ACT AERO Inhale 2 puffs into the lungs 2 (two) times daily. 05/30/24   Hector Houston, MD  montelukast  (SINGULAIR ) 10 MG tablet Take 1 tablet (10 mg total) by mouth at bedtime. 09/24/23   Fleming, Zelda W, NP  ondansetron  (ZOFRAN ) 4 MG tablet Take 1 tablet (4 mg total) by mouth every 8 (eight) hours as needed for nausea or vomiting. 07/15/23   Hector Elgin LABOR, MD  triamcinolone  cream (KENALOG ) 0.1 % Apply 1 Application topically 2 (two) times daily. 04/10/24   Hector Rosaline SQUIBB, NP    Allergies: Patient has no known allergies.    Review of Systems  HENT:  Positive for congestion.   Respiratory:   Positive for cough, shortness of breath and wheezing.     Updated Vital Signs BP (!) 145/82   Pulse 95   Temp 97.9 F (36.6 C) (Temporal)   Resp 17   Ht 6' (1.829 m)   Wt 90.7 kg   SpO2 96%   BMI 27.12 kg/m   Physical Exam Vitals and nursing note reviewed.  Constitutional:      General: He is not in acute distress.    Appearance: He is well-developed.  HENT:     Head: Normocephalic and atraumatic.     Nose: Congestion present.     Mouth/Throat:     Pharynx: No pharyngeal swelling or oropharyngeal exudate.  Eyes:     Conjunctiva/sclera: Conjunctivae normal.  Cardiovascular:     Rate and Rhythm: Normal rate and regular rhythm.     Pulses: Normal pulses.     Heart sounds: Normal heart sounds. No murmur heard. Pulmonary:     Effort: Pulmonary effort is normal. No respiratory distress.     Breath sounds: Examination of the right-upper field reveals wheezing. Examination of the left-upper field reveals wheezing. Examination of the right-middle field reveals wheezing. Examination of the left-middle field reveals wheezing. Examination of the right-lower field reveals wheezing. Examination of the left-lower field reveals wheezing. Wheezing present.  Abdominal:     Palpations: Abdomen is soft.  Tenderness: There is no abdominal tenderness.  Musculoskeletal:        General: No swelling.     Cervical back: Neck supple.     Right lower leg: No edema.     Left lower leg: No edema.  Skin:    General: Skin is warm and dry.     Capillary Refill: Capillary refill takes less than 2 seconds.  Neurological:     General: No focal deficit present.     Mental Status: He is alert. He is disoriented.  Psychiatric:        Mood and Affect: Mood normal.     (all labs ordered are listed, but only abnormal results are displayed) Labs Reviewed  RESP PANEL BY RT-PCR (RSV, FLU A&B, COVID)  [id number]    EKG: EKG Interpretation Date/Time:  Monday September 14 2024 17:48:19 EST Ventricular  Rate:  97 PR Interval:  157 QRS Duration:  77 QT Interval:  317 QTC Calculation: 403 R Axis:   74  Text Interpretation: Sinus arrhythmia Right atrial enlargement no acute ST/T changes no significant change since Aug 2025 Confirmed by Freddi Hamilton [id number]) on 09/14/2024 7:10:49 PM  Radiology: DG Chest 2 View Result Date: 09/14/2024 CLINICAL DATA:  Shortness of breath with cough EXAM: CHEST - 2 VIEW COMPARISON:  Chest x-ray 08/31/2024.  Chest CT 05/29/2024. FINDINGS: Subcentimeter nodular density persists in the region of previous nodular airspace opacity. This is unchanged from 08/31/2024. The lungs are otherwise clear. There is no pleural effusion or pneumothorax. The cardiomediastinal silhouette is within normal limits. No acute fractures are seen. IMPRESSION: Subcentimeter nodular density persists in the region of previous nodular airspace opacity. This is unchanged from 08/31/2024. Recommend continued follow-up to resolution. Electronically Signed   By: Greig Pique M.D.   On: 09/14/2024 17:17     Procedures   Medications Ordered in the ED  methylPREDNISolone  sodium succinate (SOLU-MEDROL ) 125 mg/2 mL injection 125 mg (125 mg Intravenous Given 09/14/24 1743)  magnesium  sulfate IVPB 2 g 50 mL (0 g Intravenous Stopped 09/14/24 1844)  ipratropium-albuterol  (DUONEB) 0.5-2.5 (3) MG/3ML nebulizer solution 3 mL (3 mLs Nebulization Given 09/14/24 1738)  ipratropium-albuterol  (DUONEB) 0.5-2.5 (3) MG/3ML nebulizer solution 3 mL (3 mLs Nebulization Given 09/14/24 1837)                                    Medical Decision Making Risk Prescription drug management.   This patient presents to the ED for concern of wheezing and URI symptoms, this involves an extensive number of treatment options, and is a complaint that carries with it a high risk of complications and morbidity.  The differential diagnosis includes asthma exacerbation, COVID, flu, RSV, viral URI, pneumonia   Co morbidities /  Chronic conditions that complicate the patient evaluation  Asthma   Lab Tests:  I Ordered, and personally interpreted labs.  The pertinent results include: Respiratory panel negative   Imaging Studies ordered:  I ordered imaging studies including chest x-ray I independently visualized and interpreted imaging which showed subcentimeter nodular density persist in the region of previous nodular airspace opacity.  This is unchanged from 08/31/2024.  Recommend continued follow-up to resolution. I agree with the radiologist interpretation   Cardiac Monitoring: / EKG:  The patient was maintained on a cardiac monitor.  I personally viewed and interpreted the cardiac monitored which showed an underlying rhythm of: Sinus arrhythmia, RAD   Problem List /  ED Course / Critical interventions / Medication management I ordered medication including DuoNeb, Solu-Medrol , magnesium  Reevaluation of the patient after these medicines showed that the patient still having wheezing in lower lobes I have reviewed the patients home medicines and have made adjustments as needed Patient able to ambulate on pulse ox and maintain SpO2 between 97 and 100% on room air.    Test / Admission - Considered:  Consider for admission or further workup however patient's vital signs, physical exam, labs, and imaging are reassuring.  Patient's symptoms likely due to mild acute asthma exacerbation.  Patient given prednisone  taper and DuoNeb solution outpatient.  Patient states that he has albuterol  inhaler and refills available.  Patient given return precautions.  I feel patient is safer discharge at this time.     Final diagnoses:  Mild asthma with exacerbation, unspecified whether persistent    ED Discharge Orders          Ordered    For home use only DME Nebulizer machine        09/14/24 1918    ipratropium-albuterol  (DUONEB) 0.5-2.5 (3) MG/3ML SOLN  Every 6 hours PRN        09/14/24 1918    predniSONE   (STERAPRED UNI-PAK 21 TAB) 10 MG (21) TBPK tablet  Daily        09/14/24 1918               Hector Ileana SAILOR, PA-C 09/14/24 1918    Freddi Hamilton, MD 09/17/24 1416

## 2024-09-14 NOTE — ED Notes (Signed)
 Pt ambulated with pulse ox, pt O2 sats maintained between 97-100% on RA.

## 2024-09-14 NOTE — ED Notes (Signed)
 CCMD Called

## 2024-09-14 NOTE — Discharge Instructions (Signed)
 Today you were seen for an asthma exacerbation.  Please pick up your prednisone  taper and DuoNeb solution and use as prescribed.  Please return to the ED if you have worsening symptoms.  Thank you for letting us  treat you today. After reviewing your labs and imaging, I feel you are safe to go home. Please follow up with your PCP in the next several days and provide them with your records from this visit. Return to the Emergency Room if pain becomes severe or symptoms worsen.

## 2024-09-18 ENCOUNTER — Other Ambulatory Visit: Payer: Self-pay

## 2024-09-18 ENCOUNTER — Other Ambulatory Visit: Payer: Self-pay | Admitting: *Deleted

## 2024-09-18 NOTE — Patient Outreach (Signed)
 Complex Care Management   Visit Note  09/18/2024  Name:  Hector Gross MRN: [id number] DOB: 06/25/2001  Situation: Referral received for Complex Care Management related to Asthma I obtained verbal consent from Patient.  Visit completed with Patient  on the phone  Background:   Past Medical History:  Diagnosis Date   Asthma     Assessment: Successful outreach completed.  Noted since last outreach patient has went to ED 3 times for asthma exacerbation.  Discussed that rescue inhalers, only treat sudden symptoms, and maintenance medicines work quietly in the background to prevent symptoms form developing.  I have informed patient that staying consistent with the maintenance medications will help him breathe more easily daily and lowers the chances of severe attack that may require emergency care.  I have encouraged [name] to make sure he gets his refills for his Dulera  and Singulair .  I have discussed with [name] that Singulair  helps manage asthma and allergy symptoms.  I have informed him that Singulair  helps prevent symptoms rather than treat suddent attacks.  Patient reports that he has Pulmonary appointment scheduled for 09/23/24. Patient Reported Symptoms:  Cognitive Cognitive Status: Able to follow simple commands, Alert and oriented to person, place, and time, Insightful and able to interpret abstract concepts, Normal speech and language skills Cognitive/Intellectual Conditions Management [RPT]: None reported or documented in medical history or problem list   Health Maintenance Behaviors: Annual physical exam, Exercise, Healthy diet, Sleep adequate, Stress management Healing Pattern: Average Health Facilitated by: Healthy diet, Rest, Stress management  Neurological Neurological Review of Symptoms: No symptoms reported Neurological Management Strategies: Adequate rest, Routine screening Neurological Self-Management Outcome: 4 (good)  HEENT HEENT Symptoms Reported: No symptoms  reported HEENT Management Strategies: Adequate rest, Routine screening HEENT Self-Management Outcome: 4 (good)    Cardiovascular Cardiovascular Symptoms Reported: No symptoms reported Does patient have uncontrolled Hypertension?: No Cardiovascular Management Strategies: Adequate rest, Routine screening Cardiovascular Self-Management Outcome: 4 (good)  Respiratory Respiratory Symptoms Reported: No symptoms reported Additional Respiratory Details: Discussed that rescue inhalers, only treat sudden symptoms, and maintenance medicines work quietly in the background to prevent symptoms form developing.  I have informed patient that staying consistent with the maintenance medications will help him breathe more easily daily and lowers the chances of severe attack that may require emergency care.  I have encouraged [name] to make sure he gets his refills for his Dulera  and Singulair .  I have discussed with Mr. Poss that Singulair  helps manage asthma and allergy symptoms.  I have informed him that Singulair  helps prevent symptoms rather than treat suddent attacks.  Patient reports that he has Pulmonary appointment scheduled for 09/23/24. Respiratory Management Strategies: Adequate rest, Asthma action plan, Routine screening Respiratory Self-Management Outcome: 3 (uncertain)  Endocrine Endocrine Symptoms Reported: No symptoms reported Is patient diabetic?: No Endocrine Self-Management Outcome: 4 (good)  Gastrointestinal Gastrointestinal Symptoms Reported: No symptoms reported Gastrointestinal Management Strategies: Adequate rest Gastrointestinal Self-Management Outcome: 4 (good)    Genitourinary Genitourinary Symptoms Reported: No symptoms reported Genitourinary Management Strategies: Adequate rest Genitourinary Self-Management Outcome: 4 (good)  Integumentary Integumentary Symptoms Reported: Other Other Integumentary Symptoms: Patient reports that he has eczema.  He uses Triamcinolone  cream to help  manage eczema.  I have informed patient to moisturize his skin regular to strengthen the skin barrier and to avoid triggers like harsh soaps, allergens, and scratching.  I have also informed him to use gentle and consistent skincare and bathing products. Skin Management Strategies: Adequate rest, Medication therapy, Routine  screening Skin Self-Management Outcome: 3 (uncertain)  Musculoskeletal Musculoskelatal Symptoms Reviewed: No symptoms reported Musculoskeletal Management Strategies: Adequate rest, Routine screening Musculoskeletal Self-Management Outcome: 4 (good) Falls in the past year?: No Number of falls in past year: 1 or less Was there an injury with Fall?: No Fall Risk Category Calculator: 0 Patient Fall Risk Level: Low Fall Risk Patient at Risk for Falls Due to: No Fall Risks  Psychosocial Psychosocial Symptoms Reported: No symptoms reported   Major Change/Loss/Stressor/Fears (CP): Denies Quality of Family Relationships: helpful, involved, supportive Do you feel physically threatened by others?: No    09/18/2024    PHQ2-9 Depression Screening   Little interest or pleasure in doing things Not at all  Feeling down, depressed, or hopeless Not at all  PHQ-2 - Total Score 0  Trouble falling or staying asleep, or sleeping too much    Feeling tired or having little energy    Poor appetite or overeating     Feeling bad about yourself - or that you are a failure or have let yourself or your family down    Trouble concentrating on things, such as reading the newspaper or watching television    Moving or speaking so slowly that other people could have noticed.  Or the opposite - being so fidgety or restless that you have been moving around a lot more than usual    Thoughts that you would be better off dead, or hurting yourself in some way    PHQ2-9 Total Score    If you checked off any problems, how difficult have these problems made it for you to do your work, take care of things at  home, or get along with other people    Depression Interventions/Treatment      There were no vitals filed for this visit. Pain Scale: 0-10 Pain Score: 0-No pain  Medications Reviewed Today     Reviewed by Sonjia Wilcoxson A, RN (Case Manager) on 09/18/24 at 0957  Med List Status: <None>   Medication Order Taking? Sig Documenting Provider Last Dose Status Informant  albuterol  (VENTOLIN  HFA) 108 (90 Base) MCG/ACT inhaler [id number] Yes Inhale 1-2 puffs into the lungs every 6 (six) hours as needed for wheezing or shortness of breath. Raford Lenis, MD  Active   ipratropium-albuterol  (DUONEB) 0.5-2.5 (3) MG/3ML SOLN [id number] Yes Take 3 mLs by nebulization every 6 (six) hours as needed. Francis Ileana SAILOR, PA-C  Active   mometasone -formoterol  (DULERA ) 100-5 MCG/ACT TERESE [id number]  Inhale 2 puffs into the lungs 2 (two) times daily.  Patient not taking: Reported on 09/18/2024   Levander Houston, MD  Active   montelukast  (SINGULAIR ) 10 MG tablet [id number]  Take 1 tablet (10 mg total) by mouth at bedtime.  Patient not taking: Reported on 09/18/2024   Theotis Haze ORN, NP  Active   ondansetron  (ZOFRAN ) 4 MG tablet [id number]  Take 1 tablet (4 mg total) by mouth every 8 (eight) hours as needed for nausea or vomiting.  Patient not taking: Reported on 09/18/2024   Briana Elgin LABOR, MD  Active   predniSONE  (STERAPRED UNI-PAK 21 TAB) 10 MG (21) TBPK tablet [id number] Yes Take by mouth daily. Take 6 tabs by mouth daily  for 2 days, then 5 tabs for 2 days, then 4 tabs for 2 days, then 3 tabs for 2 days, 2 tabs for 2 days, then 1 tab by mouth daily for 2 days Keith, Kayla N, PA-C  Active   triamcinolone  cream (KENALOG ) 0.1 % [id number] Yes  Apply 1 Application topically 2 (two) times daily. Celestia Rosaline SQUIBB, NP  Active             Recommendation:   PCP Follow-up Specialty provider follow-up :Pulmonology-09/23/24 Continue Current Plan of Care  Follow Up Plan:   Telephone follow-up in 1 month: 11/03/24 @ 9:30  am  Zephyra Bernardi, RN, BSN, ACM RN Care Manager [employer] [phone]

## 2024-09-18 NOTE — Patient Instructions (Signed)
 Visit Information  Hector Gross was given information about Medicaid Managed Care team care coordination services as a part of their [facility] Medicaid benefit. Hector Gross   If you would like to schedule transportation through your [facility] plan, please call the following number at least 2 days in advance of your appointment: [phone]  For information about your ride after you set it up, call Ride Assist at [phone]. Use this number to activate a Will Call pickup, or if your transportation is late for a scheduled pickup. Use this number, too, if you need to make a change or cancel a previously scheduled reservation.  If you need transportation services right away, call [phone]. The after-hours call center is staffed 24 hours to handle ride assistance and urgent reservation requests (including discharges) 365 days a year. Urgent trips include sick visits, hospital discharge requests and life-sustaining treatment.  Call the [facility] Line at [phone], at any time, 24 hours a day, 7 days a week. If you are in danger or need immediate medical attention call 911.   Please see education materials related to Asthma provided by MyChart link.  Care plan and visit instructions communicated with the patient verbally today. Patient agrees to receive a copy in MyChart. Active MyChart status and patient understanding of how to access instructions and care plan via MyChart confirmed with patient.     Telephone follow up appointment with Managed Medicaid care management team member scheduled for: 11/03/24 @ 9:30 am  Hector Clouatre, RN, BSN, [facility] RN Care Manager [employer] [phone]

## 2024-09-21 ENCOUNTER — Emergency Department (HOSPITAL_COMMUNITY)

## 2024-09-21 ENCOUNTER — Other Ambulatory Visit: Payer: Self-pay

## 2024-09-21 ENCOUNTER — Encounter (HOSPITAL_COMMUNITY): Payer: Self-pay

## 2024-09-21 ENCOUNTER — Emergency Department (HOSPITAL_COMMUNITY)
Admission: EM | Admit: 2024-09-21 | Discharge: 2024-09-22 | Disposition: A | Attending: Emergency Medicine | Admitting: Emergency Medicine

## 2024-09-21 DIAGNOSIS — R0602 Shortness of breath: Secondary | ICD-10-CM | POA: Diagnosis not present

## 2024-09-21 MED ORDER — ALBUTEROL SULFATE (2.5 MG/3ML) 0.083% IN NEBU
2.5000 mg | INHALATION_SOLUTION | Freq: Once | RESPIRATORY_TRACT | Status: AC
  Administered 2024-09-21: 2.5 mg via RESPIRATORY_TRACT
  Filled 2024-09-21: qty 3

## 2024-09-21 MED ORDER — DEXAMETHASONE 4 MG PO TABS
10.0000 mg | ORAL_TABLET | Freq: Once | ORAL | Status: AC
  Administered 2024-09-21: 10 mg via ORAL
  Filled 2024-09-21: qty 3

## 2024-09-21 MED ORDER — ALBUTEROL SULFATE HFA 108 (90 BASE) MCG/ACT IN AERS: 2.0000 | INHALATION_SPRAY | Freq: Once | RESPIRATORY_TRACT | Status: DC

## 2024-09-21 NOTE — Discharge Instructions (Signed)
 Use your albuterol  inhaler or nebulizer every 4-6 hours for management of symptoms.  Follow-up with pulmonary at your scheduled appointment in 2 days.  Return for new or concerning symptoms.

## 2024-09-21 NOTE — ED Triage Notes (Signed)
 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 He does not have an MDI since his last one expired.

## 2024-09-22 ENCOUNTER — Other Ambulatory Visit: Payer: Self-pay

## 2024-09-22 ENCOUNTER — Emergency Department (HOSPITAL_COMMUNITY)
Admission: EM | Admit: 2024-09-22 | Discharge: 2024-09-22 | Discharge status: Against Medical Advice | Attending: Emergency Medicine | Admitting: Emergency Medicine

## 2024-09-22 MED ORDER — ALBUTEROL SULFATE HFA 108 (90 BASE) MCG/ACT IN AERS
2.0000 | INHALATION_SPRAY | RESPIRATORY_TRACT | Status: DC | PRN
  Administered 2024-09-22: 2 via RESPIRATORY_TRACT
  Filled 2024-09-22: qty 6.7

## 2024-09-22 NOTE — ED Provider Notes (Signed)
 [facility] AT [facility] Provider Note   CSN: [id number] Arrival date & time: 09/21/24  2150     Patient presents with: Shortness of Breath   [name] is a 23 y.o. male.   23 year old male presents to the emergency department for shortness of breath.  He states that symptoms began this evening.  He works as a administrator.  He has not had his Dulera  in several months.  Also ran out of his albuterol  inhaler a few days ago.  He has follow-up with pulmonology scheduled in 2 days.  He has not had any fevers or recent sick contacts.  Feels better after receiving a nebulizer in triage.  The history is provided by the patient. No language interpreter was used.  Shortness of Breath      Prior to Admission medications   Medication Sig Start Date End Date Taking? Authorizing Provider  albuterol  (VENTOLIN  HFA) 108 (90 Base) MCG/ACT inhaler Inhale 1-2 puffs into the lungs every 6 (six) hours as needed for wheezing or shortness of breath. 06/21/24   Raford Lenis, MD  ipratropium-albuterol  (DUONEB) 0.5-2.5 (3) MG/3ML SOLN Take 3 mLs by nebulization every 6 (six) hours as needed. 09/14/24   Keith, Kayla N, PA-C  mometasone -formoterol  (DULERA ) 100-5 MCG/ACT AERO Inhale 2 puffs into the lungs 2 (two) times daily. Patient not taking: Reported on 09/18/2024 05/30/24   Levander Houston, MD  montelukast  (SINGULAIR ) 10 MG tablet Take 1 tablet (10 mg total) by mouth at bedtime. Patient not taking: Reported on 09/18/2024 09/24/23   Theotis Haze ORN, NP  ondansetron  (ZOFRAN ) 4 MG tablet Take 1 tablet (4 mg total) by mouth every 8 (eight) hours as needed for nausea or vomiting. Patient not taking: Reported on 09/18/2024 07/15/23   Briana Elgin LABOR, MD  predniSONE  (STERAPRED UNI-PAK 21 TAB) 10 MG (21) TBPK tablet Take by mouth daily. Take 6 tabs by mouth daily  for 2 days, then 5 tabs for 2 days, then 4 tabs for 2 days, then 3 tabs for 2 days, 2 tabs for 2 days, then 1 tab by mouth  daily for 2 days 09/14/24   Keith, Kayla N, PA-C  triamcinolone  cream (KENALOG ) 0.1 % Apply 1 Application topically 2 (two) times daily. 04/10/24   Celestia Rosaline SQUIBB, NP    Allergies: Patient has no known allergies.    Review of Systems  Respiratory:  Positive for shortness of breath.   Ten systems reviewed and are negative for acute change, except as noted in the HPI.    Updated Vital Signs BP (!) 136/92 (BP Location: Right Arm)   Pulse 84   Temp 97.6 F (36.4 C)   Resp 20   Ht 6' (1.829 m)   Wt 90.7 kg   SpO2 95%   BMI 27.12 kg/m   Physical Exam Vitals and nursing note reviewed.  Constitutional:      General: He is not in acute distress.    Appearance: He is well-developed. He is not diaphoretic.     Comments: Nontoxic appearing and in NAD  HENT:     Head: Normocephalic and atraumatic.  Eyes:     General: No scleral icterus.    Conjunctiva/sclera: Conjunctivae normal.  Cardiovascular:     Rate and Rhythm: Normal rate and regular rhythm.     Pulses: Normal pulses.  Pulmonary:     Effort: Pulmonary effort is normal. No respiratory distress.     Comments: Faint residual expiratory wheeze w/o dyspnea, tachypnea.  Chest expansion symmetric. Musculoskeletal:        General: Normal range of motion.     Cervical back: Normal range of motion.  Skin:    General: Skin is warm and dry.     Coloration: Skin is not pale.     Findings: No erythema or rash.  Neurological:     Mental Status: He is alert and oriented to person, place, and time.  Psychiatric:        Behavior: Behavior normal.     (all labs ordered are listed, but only abnormal results are displayed) Labs Reviewed - No data to display  EKG: None  Radiology: DG Chest 2 View Result Date: 09/21/2024 EXAM: 2 VIEW(S) XRAY OF THE CHEST 09/21/2024 10:17:00 PM COMPARISON: 09/14/2024 CLINICAL HISTORY: shortness of breath FINDINGS: LUNGS AND PLEURA: No focal pulmonary opacity. No pleural effusion. No pneumothorax.  HEART AND MEDIASTINUM: No acute abnormality of the cardiac and mediastinal silhouettes. BONES AND SOFT TISSUES: No acute osseous abnormality. IMPRESSION: 1. No acute cardiopulmonary process. Electronically signed by: Morgane Naveau MD 09/21/2024 10:20 PM EST RP Workstation: [id number]     Procedures   Medications Ordered in the ED  albuterol  (VENTOLIN  HFA) 108 (90 Base) MCG/ACT inhaler 2 puff (has no administration in time range)  albuterol  (PROVENTIL ) (2.5 MG/3ML) 0.083% nebulizer solution 2.5 mg (2.5 mg Nebulization Given 09/21/24 2210)  dexamethasone  (DECADRON ) tablet 10 mg (10 mg Oral Given 09/21/24 2350)                                    Medical Decision Making Amount and/or Complexity of Data Reviewed Radiology: ordered.  Risk Prescription drug management.   This patient presents to the ED for concern of SOB, this involves an extensive number of treatment options, and is a complaint that carries with it a high risk of complications and morbidity.  The differential diagnosis includes viral illness vs asthma exacerbation vs PNA vs PTX vs anxiety   Co morbidities that complicate the patient evaluation  Asthma   Additional history obtained:  External records from outside source obtained and reviewed including prior discharge summaries   Imaging Studies ordered:  I ordered imaging studies including CXR  I independently visualized and interpreted imaging which showed no acute cardiopulmonary abnormality I agree with the radiologist interpretation   Cardiac Monitoring:  The patient was maintained on a cardiac monitor.  I personally viewed and interpreted the cardiac monitored which showed an underlying rhythm of: NSR   Medicines ordered and prescription drug management:  I ordered medication including Decadron  and albuterol  for SOB  Reevaluation of the patient after these medicines showed that the patient improved I have reviewed the patients home medicines and have  made adjustments as needed   Test Considered:  Respiratory viral panel   Problem List / ED Course:  Symptoms consistent with asthma exacerbation.  He has been out of his asthma medications, likely contributing to his exacerbation today. Patient afebrile without hypoxia.  He has residual scattered wheezing on auscultation, but no dyspnea or tachypnea.  Chest x-ray is reassuring. Symptomatically improved following albuterol  nebulizer treatment.  He has follow-up scheduled with pulmonology in 2 days.  Do not feel further emergent workup is presently indicated.  Given albuterol  MDI for outpatient use pending follow-up.   Reevaluation:  After the interventions noted above, I reevaluated the patient and found that they have :improved   Social Determinants of Health:  Lives independently   Dispostion:  After consideration of the diagnostic results and the patients response to treatment, I feel that the patent would benefit from albuterol  use PRN. Has appt with pulmonology in 2 days. Stressed need to f/u with this provider. Return precautions discussed and provided. Patient discharged in stable condition with no unaddressed concerns.       Final diagnoses:  SOB (shortness of breath)    ED Discharge Orders     None          Keith Sor, PA-C 09/22/24 0029    Palumbo, April, MD 09/22/24 0134

## 2024-09-22 NOTE — ED Triage Notes (Signed)
 Pt states he was here yesterday, here today because he needs an inhaler.

## 2024-09-22 NOTE — ED Triage Notes (Signed)
 Pt would like an inhaler for his asthma.  Pt states that he has asthma and ran out yesterday and was seen here for this but not provided an inhaler, he was told to wait until appointment to see what MD thinks tomorrow.

## 2024-09-22 NOTE — ED Notes (Signed)
 Pt called multiple times with no response

## 2024-09-23 ENCOUNTER — Telehealth (HOSPITAL_BASED_OUTPATIENT_CLINIC_OR_DEPARTMENT_OTHER): Payer: Self-pay | Admitting: Pulmonary Disease

## 2024-09-23 ENCOUNTER — Ambulatory Visit (INDEPENDENT_AMBULATORY_CARE_PROVIDER_SITE_OTHER): Admitting: Pulmonary Disease

## 2024-09-23 ENCOUNTER — Encounter (HOSPITAL_BASED_OUTPATIENT_CLINIC_OR_DEPARTMENT_OTHER): Payer: Self-pay | Admitting: Pulmonary Disease

## 2024-09-23 ENCOUNTER — Other Ambulatory Visit (HOSPITAL_BASED_OUTPATIENT_CLINIC_OR_DEPARTMENT_OTHER): Payer: Self-pay

## 2024-09-23 VITALS — BP 120/84 | HR 73 | Ht 73.0 in | Wt 200.5 lb

## 2024-09-23 DIAGNOSIS — J4540 Moderate persistent asthma, uncomplicated: Secondary | ICD-10-CM | POA: Diagnosis not present

## 2024-09-23 DIAGNOSIS — J984 Other disorders of lung: Secondary | ICD-10-CM

## 2024-09-23 MED ORDER — SYMBICORT 160-4.5 MCG/ACT IN AERO: 2.0000 | INHALATION_SPRAY | Freq: Two times a day (BID) | RESPIRATORY_TRACT | 5 refills | Status: AC

## 2024-09-23 NOTE — Assessment & Plan Note (Signed)
--  START brand Symbicort 160-4.5 mcg TWO puffs in the morning and evening. Rinse out after use --CONTINUE Proventil  1-2 puffs AS NEEDED for shortness of breath and wheezing --ORDER nebulizer --SCHEDULE pulmonary function test prior to next visit  Asthma Action Plan USE albuterol  handheld or nebulizer every 4-6 hours for worsening shortness of breath, wheezing and cough. If you symptoms do not improve in 24-48 hours, please our office for evaluation and/or prednisone  taper.

## 2024-09-23 NOTE — Patient Instructions (Signed)
 Moderate persistent asthma without complication --START brand Symbicort 160-4.5 mcg TWO puffs in the morning and evening. Rinse out after use --CONTINUE Proventil  1-2 puffs AS NEEDED for shortness of breath and wheezing --ORDER nebulizer --SCHEDULE pulmonary function test prior to next visit  Asthma Action Plan USE albuterol  handheld or nebulizer every 4-6 hours for worsening shortness of breath, wheezing and cough. If you symptoms do not improve in 24-48 hours, please our office for evaluation and/or prednisone  taper.  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

## 2024-09-23 NOTE — Telephone Encounter (Signed)
 Patient left office without scheduling followup. He needs a PFT and f.u with Dr Kassie in January. When patient calls to schedule, please ensure both visits are at [facility] location They do not need to be on the same day though. Left voicemail advising patient callback.

## 2024-09-23 NOTE — Progress Notes (Signed)
 Subjective:   PATIENT ID: Hector Gross GENDER: male DOB: 03-Oct-2001, MRN: [id number]  Chief Complaint  Patient presents with   Consult    Reason for Visit: New consult for asthma     Hector Gross is a 23 y.o. male active smoker with asthma who presents for asthma management.     Social History: Active THC smoker 4-5 times a day Never vaper  Environmental exposures:  Landscaper - dust, grass, outdoor allergens     09/23/2024 Discussed the use of AI scribe software for clinical note transcription with the patient, who gave verbal consent to proceed.  History of Present Illness Hector Gross is a 23 year old male with asthma who presents for pulmonary evaluation.  Asthma was diagnosed in 2018. He experiences shortness of breath primarily when not on maintenance medications, during work, or when smoking marijuana. He has been to the emergency department 12 times this year due to shortness of breath, where he typically receives breathing treatments, prednisone , and magnesium . He is currently using Proventil  as needed and has tried Dulera , Trelegy, Ventolin , and Proventil  inhalers in the past. He does not have a nebulizer at home but has the liquid medication for it.  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 He has been in this job for about a year.  He has been smoking marijuana since 2019, approximately four to five times a day, using traditional methods. He does not smoke cigarettes or vape.  A CT scan from May 29, 2024, showed a left upper lobe cavitary nodule and some lung damage possibly related to smoking. No fevers or chills. His IgE level is 4700. Aspergillus serum labs neg.       Past Medical History:  Diagnosis Date   Asthma      Family History  Problem Relation Age of Onset   Diabetes Maternal Grandmother      Social History   Occupational History   Occupation:  public affairs consultant  Tobacco Use   Smoking status: Never   Smokeless tobacco: Never  Vaping Use   Vaping status: Never Used  Substance and Sexual Activity   Alcohol use: No   Drug use: Yes    Types: Marijuana   Sexual activity: Not Currently    No Known Allergies   Outpatient Medications Prior to Visit  Medication Sig Dispense Refill   albuterol  (VENTOLIN  HFA) 108 (90 Base) MCG/ACT inhaler Inhale 1-2 puffs into the lungs every 6 (six) hours as needed for wheezing or shortness of breath. 18 g 3   ipratropium-albuterol  (DUONEB) 0.5-2.5 (3) MG/3ML SOLN Take 3 mLs by nebulization every 6 (six) hours as needed. 360 mL 0   predniSONE  (STERAPRED UNI-PAK 21 TAB) 10 MG (21) TBPK tablet Take by mouth daily. Take 6 tabs by mouth daily  for 2 days, then 5 tabs for 2 days, then 4 tabs for 2 days, then 3 tabs for 2 days, 2 tabs for 2 days, then 1 tab by mouth daily for 2 days 42 tablet 0   triamcinolone  cream (KENALOG ) 0.1 % Apply 1 Application topically 2 (two) times daily. 60 g 0   montelukast  (SINGULAIR ) 10 MG tablet Take 1 tablet (10 mg total) by mouth at bedtime. (Patient not taking: Reported on 09/23/2024) 30 tablet 0   ondansetron  (ZOFRAN ) 4 MG tablet Take 1 tablet (4 mg total) by mouth every 8 (eight) hours as needed for nausea or vomiting. (Patient not taking: Reported  on 09/23/2024) 20 tablet 0   mometasone -formoterol  (DULERA ) 100-5 MCG/ACT AERO Inhale 2 puffs into the lungs 2 (two) times daily. (Patient not taking: Reported on 09/23/2024) 13 g 1   No facility-administered medications prior to visit.    ROS   Objective:   Vitals:   09/23/24 0911  BP: 120/84  Pulse: 73  SpO2: 97%  Weight: 200 lb 8 oz (90.9 kg)  Height: 6' 1 (1.854 m)   SpO2: 97 %  Physical Exam: General: Well-appearing, no acute distress HENT: [place], AT Eyes: EOMI, no scleral icterus Respiratory: Clear to auscultation bilaterally.  No crackles, wheezing or rales Cardiovascular: RRR, -M/R/G, no  JVD Extremities:-Edema,-tenderness Neuro: AAO x4, CNII-XII grossly intact Psych: Normal mood, normal affect  Data Reviewed:  Imaging: CT Chest 05/29/24 LUL cavitary nodule, bronchiectasis  PFT: None on file  Labs:    Latest Ref Rng & Units 05/29/2024    9:09 AM 03/02/2024    2:46 PM 02/29/2024    1:04 PM  CBC  WBC 4.0 - 10.5 K/uL  12.3  6.8   Hemoglobin 13.0 - 17.0 g/dL 85.6  83.9  84.0   Hematocrit 39.0 - 52.0 % 42.0  46.9  46.1   Platelets 150 - 400 K/uL  419  408     03/02/24 Absolute eos 03/02/24 - 700 05/21/24 IgE - 4705 05/30/24 Aspergillus fumigatus/flavus/niger - neg     Assessment & Plan:   Discussion: Uncontrolled asthma and not consistently on maintenance bronchodilators. Discussed clinical course and management of asthma including bronchodilator regimen, preventive care including vaccinations and action plan for exacerbation. Advised smoking/THC cessation. Also reviewed CT scan and concern for resolution of cavitary lesion.      Assessment & Plan Moderate persistent asthma without complication --START brand Symbicort 160-4.5 mcg TWO puffs in the morning and evening. Rinse out after use --CONTINUE Proventil  1-2 puffs AS NEEDED for shortness of breath and wheezing --ORDER nebulizer --SCHEDULE pulmonary function test prior to next visit  Asthma Action Plan USE albuterol  handheld or nebulizer every 4-6 hours for worsening shortness of breath, wheezing and cough. If you symptoms do not improve in 24-48 hours, please our office for evaluation and/or prednisone  taper.  Cavitary lesion of lung Left upper lobe. Differential includes infectious including bacterial and atypical causes, inflammatory. Low suspicion for malignancy but cannot rule out. If persistent despite conservative management, may require diagnostic bronchoscopy. --Reviewed CT Chest 05/29/24 measured 11 x 10 mm cavitary lesion with subcentimeter nodules in LUL and ground glass in RUL --ORDER CT Chest  without contrast when next available  Health Maintenance  There is no immunization history on file for this patient. CT Lung Screen - not qualified  Orders Placed This Encounter  Procedures   CT Chest Wo Contrast    Standing Status:   Future    Expiration Date:   09/23/2025    Scheduling Instructions:     When next available    Preferred imaging location?:   [facility]   Ambulatory Referral for DME    Referral Priority:   Routine    Referral Type:   Durable Medical Equipment Purchase    Number of Visits Requested:   1   Pulmonary function test    Standing Status:   Future    Expiration Date:   09/23/2025    Where should this test be performed?:   Outpatient Pulmonary    What type of PFT is being ordered?:   Full PFT   Meds ordered this encounter  Medications   SYMBICORT 160-4.5 MCG/ACT inhaler    Sig: Inhale 2 puffs into the lungs 2 (two) times daily.    Dispense:  10.2 g    Refill:  5    Brand name only    Return for anytime in January, after PFT.  I have spent a total time of 45-minutes on the day of the appointment reviewing prior documentation, coordinating care and discussing medical diagnosis and plan with the patient/family. Imaging, labs and tests included in this note have been reviewed and interpreted independently by me. This note is generated using [employer] programming. Patient/family has given consent.  Kylina Vultaggio Slater Staff, MD [facility] 09/23/2024 11:32 AM

## 2024-10-01 ENCOUNTER — Ambulatory Visit (HOSPITAL_BASED_OUTPATIENT_CLINIC_OR_DEPARTMENT_OTHER): Attending: Pulmonary Disease

## 2024-10-01 ENCOUNTER — Other Ambulatory Visit: Payer: Self-pay

## 2024-11-03 ENCOUNTER — Telehealth: Payer: Self-pay | Admitting: *Deleted

## 2024-11-27 ENCOUNTER — Telehealth: Payer: Self-pay | Admitting: *Deleted
